# Patient Record
Sex: Male | Born: 1970
Health system: Southern US, Community
[De-identification: ages and names within clinical notes are randomized; demographics above are authoritative.]

## PROBLEM LIST (undated history)

## (undated) DIAGNOSIS — R911 Solitary pulmonary nodule: Secondary | ICD-10-CM

## (undated) DIAGNOSIS — J90 Pleural effusion, not elsewhere classified: Secondary | ICD-10-CM

## (undated) DIAGNOSIS — F259 Schizoaffective disorder, unspecified: Secondary | ICD-10-CM

## (undated) DIAGNOSIS — R51 Headache: Secondary | ICD-10-CM

## (undated) DIAGNOSIS — F419 Anxiety disorder, unspecified: Secondary | ICD-10-CM

## (undated) DIAGNOSIS — F329 Major depressive disorder, single episode, unspecified: Secondary | ICD-10-CM

## (undated) DIAGNOSIS — Z9289 Personal history of other medical treatment: Secondary | ICD-10-CM

## (undated) DIAGNOSIS — F32A Depression, unspecified: Secondary | ICD-10-CM

## (undated) DIAGNOSIS — I2699 Other pulmonary embolism without acute cor pulmonale: Secondary | ICD-10-CM

## (undated) DIAGNOSIS — E785 Hyperlipidemia, unspecified: Secondary | ICD-10-CM

## (undated) DIAGNOSIS — R519 Headache, unspecified: Secondary | ICD-10-CM

## (undated) DIAGNOSIS — I719 Aortic aneurysm of unspecified site, without rupture: Secondary | ICD-10-CM

## (undated) HISTORY — DX: Hyperlipidemia, unspecified: E78.5

## (undated) HISTORY — DX: Personal history of other medical treatment: Z92.89

## (undated) HISTORY — DX: Aortic aneurysm of unspecified site, without rupture: I71.9

## (undated) HISTORY — PX: CARDIAC CATHETERIZATION: SHX172

## (undated) HISTORY — DX: Headache: R51

## (undated) HISTORY — DX: Headache, unspecified: R51.9

## (undated) HISTORY — PX: OTHER SURGICAL HISTORY: SHX169

---

## 2006-10-08 ENCOUNTER — Emergency Department (HOSPITAL_COMMUNITY): Admission: EM | Admit: 2006-10-08 | Discharge: 2006-10-08 | Payer: Self-pay | Admitting: Emergency Medicine

## 2008-08-11 ENCOUNTER — Emergency Department (HOSPITAL_COMMUNITY): Admission: EM | Admit: 2008-08-11 | Discharge: 2008-08-12 | Payer: Self-pay | Admitting: Emergency Medicine

## 2008-08-12 ENCOUNTER — Inpatient Hospital Stay (HOSPITAL_COMMUNITY): Admission: AD | Admit: 2008-08-12 | Discharge: 2008-08-15 | Payer: Self-pay | Admitting: Psychiatry

## 2008-08-12 ENCOUNTER — Ambulatory Visit: Payer: Self-pay | Admitting: Psychiatry

## 2008-12-01 ENCOUNTER — Emergency Department (HOSPITAL_COMMUNITY): Admission: EM | Admit: 2008-12-01 | Discharge: 2008-12-02 | Payer: Self-pay | Admitting: Emergency Medicine

## 2008-12-02 ENCOUNTER — Inpatient Hospital Stay (HOSPITAL_COMMUNITY): Admission: EM | Admit: 2008-12-02 | Discharge: 2008-12-04 | Payer: Self-pay | Admitting: Psychiatry

## 2008-12-03 ENCOUNTER — Ambulatory Visit: Payer: Self-pay | Admitting: Psychiatry

## 2009-01-18 ENCOUNTER — Ambulatory Visit: Payer: Self-pay | Admitting: Psychiatry

## 2009-01-18 ENCOUNTER — Emergency Department (HOSPITAL_COMMUNITY): Admission: EM | Admit: 2009-01-18 | Discharge: 2009-01-18 | Payer: Self-pay | Admitting: Emergency Medicine

## 2009-01-19 ENCOUNTER — Inpatient Hospital Stay (HOSPITAL_COMMUNITY): Admission: RE | Admit: 2009-01-19 | Discharge: 2009-01-26 | Payer: Self-pay | Admitting: Psychiatry

## 2009-01-20 ENCOUNTER — Ambulatory Visit (HOSPITAL_COMMUNITY): Admission: RE | Admit: 2009-01-20 | Discharge: 2009-01-20 | Payer: Self-pay | Admitting: Psychiatry

## 2009-09-30 ENCOUNTER — Emergency Department (HOSPITAL_COMMUNITY): Admission: EM | Admit: 2009-09-30 | Discharge: 2009-10-01 | Payer: Self-pay | Admitting: Emergency Medicine

## 2009-10-01 ENCOUNTER — Ambulatory Visit: Payer: Self-pay | Admitting: Psychiatry

## 2009-10-01 ENCOUNTER — Inpatient Hospital Stay (HOSPITAL_COMMUNITY): Admission: AD | Admit: 2009-10-01 | Discharge: 2009-10-02 | Payer: Self-pay | Admitting: Psychiatry

## 2009-11-24 ENCOUNTER — Emergency Department (HOSPITAL_COMMUNITY): Admission: EM | Admit: 2009-11-24 | Discharge: 2009-11-24 | Payer: Self-pay | Admitting: Emergency Medicine

## 2009-11-30 ENCOUNTER — Emergency Department (HOSPITAL_COMMUNITY): Admission: EM | Admit: 2009-11-30 | Discharge: 2009-11-30 | Payer: Self-pay | Admitting: Emergency Medicine

## 2009-12-04 ENCOUNTER — Emergency Department (HOSPITAL_COMMUNITY): Admission: EM | Admit: 2009-12-04 | Discharge: 2009-12-04 | Payer: Self-pay | Admitting: Emergency Medicine

## 2010-05-14 LAB — BASIC METABOLIC PANEL
BUN: 7 mg/dL (ref 6–23)
Chloride: 109 mEq/L (ref 96–112)
GFR calc non Af Amer: >60 mL/min (ref >60)
Potassium: 3.8 mEq/L (ref 3.5–5.1)
Sodium: 139 mEq/L (ref 135–145)

## 2010-05-14 LAB — CBC
HCT: 47.2 % (ref 39.0–52.0)
Hemoglobin: 16.3 g/dL (ref 13.0–17.0)
MCV: 99.2 fL (ref 78.0–100.0)
RBC: 4.76 MIL/uL (ref 4.22–5.81)
WBC: 12.7 10*3/uL — ABNORMAL HIGH (ref 4.0–10.5)

## 2010-05-14 LAB — ETHANOL: Alcohol, Ethyl (B): <5 mg/dL (ref 0–10)

## 2010-05-14 LAB — DIFFERENTIAL
Basophils Absolute: 0.2 10*3/uL — ABNORMAL HIGH (ref 0.0–0.1)
Eosinophils Relative: 3 % (ref 0–5)
Lymphocytes Relative: 30 % (ref 12–46)
Lymphs Abs: 3.8 10*3/uL (ref 0.7–4.0)
Monocytes Absolute: 0.8 10*3/uL (ref 0.1–1.0)
Monocytes Relative: 6 % (ref 3–12)
Neutro Abs: 7.5 10*3/uL (ref 1.7–7.7)

## 2010-05-14 LAB — RAPID URINE DRUG SCREEN, HOSP PERFORMED
Barbiturates: NOT DETECTED
Benzodiazepines: POSITIVE — AB

## 2010-05-14 LAB — ACETAMINOPHEN LEVEL: Acetaminophen (Tylenol), Serum: <10 ug/mL — ABNORMAL LOW (ref 10–30)

## 2010-06-02 LAB — BASIC METABOLIC PANEL
CO2: 25 mEq/L (ref 19–32)
Calcium: 9.3 mg/dL (ref 8.4–10.5)
Chloride: 102 mEq/L (ref 96–112)
GFR calc Af Amer: >60 mL/min (ref >60)
Glucose, Bld: 89 mg/dL (ref 70–99)
Potassium: 3.4 mEq/L — ABNORMAL LOW (ref 3.5–5.1)
Sodium: 137 mEq/L (ref 135–145)

## 2010-06-02 LAB — RAPID URINE DRUG SCREEN, HOSP PERFORMED
Amphetamines: NOT DETECTED
Cocaine: NOT DETECTED
Opiates: NOT DETECTED
Tetrahydrocannabinol: NOT DETECTED

## 2010-06-02 LAB — DIFFERENTIAL
Basophils Absolute: 0 10*3/uL (ref 0.0–0.1)
Basophils Relative: 0 % (ref 0–1)
Eosinophils Relative: 1 % (ref 0–5)
Lymphocytes Relative: 18 % (ref 12–46)
Lymphocytes Relative: 46 % (ref 12–46)
Monocytes Absolute: 0.7 10*3/uL (ref 0.1–1.0)
Monocytes Relative: 5 % (ref 3–12)
Neutro Abs: 10.8 10*3/uL — ABNORMAL HIGH (ref 1.7–7.7)
Neutro Abs: 4.9 10*3/uL (ref 1.7–7.7)
Neutrophils Relative %: 44 % (ref 43–77)

## 2010-06-02 LAB — URINALYSIS, ROUTINE W REFLEX MICROSCOPIC
Bilirubin Urine: NEGATIVE
Hgb urine dipstick: NEGATIVE
Ketones, ur: NEGATIVE mg/dL
Specific Gravity, Urine: 1.015 (ref 1.005–1.030)
pH: 5.5 (ref 5.0–8.0)

## 2010-06-02 LAB — CBC
HCT: 51.5 % (ref 39.0–52.0)
Hemoglobin: 17.9 g/dL — ABNORMAL HIGH (ref 13.0–17.0)
MCHC: 34.8 g/dL (ref 30.0–36.0)
Platelets: 216 10*3/uL (ref 150–400)
RBC: 5.16 MIL/uL (ref 4.22–5.81)
RDW: 13.2 % (ref 11.5–15.5)

## 2010-06-03 LAB — CBC
HCT: 48.3 % (ref 39.0–52.0)
MCV: 101.4 fL — ABNORMAL HIGH (ref 78.0–100.0)
Platelets: 229 10*3/uL (ref 150–400)
WBC: 12.8 10*3/uL — ABNORMAL HIGH (ref 4.0–10.5)

## 2010-06-03 LAB — BASIC METABOLIC PANEL
BUN: 8 mg/dL (ref 6–23)
Chloride: 105 mEq/L (ref 96–112)
Glucose, Bld: 88 mg/dL (ref 70–99)
Potassium: 4.2 mEq/L (ref 3.5–5.1)

## 2010-06-03 LAB — RAPID URINE DRUG SCREEN, HOSP PERFORMED
Barbiturates: NOT DETECTED
Benzodiazepines: POSITIVE — AB
Cocaine: NOT DETECTED
Opiates: POSITIVE — AB

## 2010-06-03 LAB — DIFFERENTIAL
Eosinophils Absolute: 0.3 10*3/uL (ref 0.0–0.7)
Eosinophils Relative: 3 % (ref 0–5)
Lymphs Abs: 2.3 10*3/uL (ref 0.7–4.0)
Monocytes Relative: 4 % (ref 3–12)

## 2010-06-03 LAB — ETHANOL: Alcohol, Ethyl (B): <5 mg/dL (ref 0–10)

## 2010-06-03 LAB — HEPATIC FUNCTION PANEL
ALT: 19 U/L (ref 0–53)
Total Protein: 6.8 g/dL (ref 6.0–8.3)

## 2010-06-03 LAB — ACETAMINOPHEN LEVEL: Acetaminophen (Tylenol), Serum: <10 ug/mL — ABNORMAL LOW (ref 10–30)

## 2010-06-07 LAB — CBC
HCT: 47.8 % (ref 39.0–52.0)
MCV: 99.2 fL (ref 78.0–100.0)
Platelets: 228 10*3/uL (ref 150–400)
RDW: 12.9 % (ref 11.5–15.5)

## 2010-06-07 LAB — DIFFERENTIAL
Basophils Absolute: 0.1 10*3/uL (ref 0.0–0.1)
Eosinophils Absolute: 0.3 10*3/uL (ref 0.0–0.7)
Eosinophils Relative: 2 % (ref 0–5)
Lymphs Abs: 2.5 10*3/uL (ref 0.7–4.0)
Monocytes Absolute: 0.4 10*3/uL (ref 0.1–1.0)

## 2010-06-07 LAB — BASIC METABOLIC PANEL
BUN: 8 mg/dL (ref 6–23)
CO2: 25 mEq/L (ref 19–32)
Chloride: 107 mEq/L (ref 96–112)
Glucose, Bld: 93 mg/dL (ref 70–99)
Potassium: 3.7 mEq/L (ref 3.5–5.1)

## 2010-06-07 LAB — LITHIUM LEVEL
Lithium Lvl: <0.25 mEq/L — ABNORMAL LOW (ref 0.80–1.40)
Lithium Lvl: <0.25 mEq/L — ABNORMAL LOW (ref 0.80–1.40)

## 2010-06-07 LAB — RAPID URINE DRUG SCREEN, HOSP PERFORMED
Barbiturates: NOT DETECTED
Cocaine: NOT DETECTED
Opiates: NOT DETECTED
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
## 2010-07-16 NOTE — Discharge Summary (Signed)
NAME:  Mcglinchey, ASHYR HEDGEPATH NO.:  [id number]   MEDICAL RECORD NO.:  [id number]          PATIENT TYPE:  IPS   LOCATION:  0502                          FACILITY:  BH   PHYSICIAN:  Geoffery Lyons, M.D.      DATE OF BIRTH:  1970-06-12   DATE OF ADMISSION:  08/12/2008  DATE OF DISCHARGE:  08/15/2008                               DISCHARGE SUMMARY   CHIEF COMPLAINT/HISTORY OF PRESENT ILLNESS:  This was the first  admission to [facility] for this 40 year old male  involuntarily petitioned stating that he was threatening to go to his  work place with his shot gun and kill 30 people.  He was threatening to  go to his apartment and kill his girlfriend.  He was expressing thoughts  of homicide.  He lost his job due to homicidal threats.  He lost contact  with his family members.  Has no income and is possibly homeless.   PAST PSYCHIATRIC HISTORY:  First time at [facility].  Has been  hospitalized prior at [facility], [facility] [place], [place].  Has  seen Dr. Betti Cruz at [facility].   ALCOHOL AND DRUG HABITS:  Denies active use of any substances.   MEDICAL HISTORY:  Noncontributory.   MEDICATIONS:  1. Klonopin 0.5 twice a day as needed.  2. Abilify 20 mg per day.  3. Lithium 300 mg two daily.  4. Paxil 20 mg two tablets daily.   PHYSICAL EXAMINATION:  Exam failed to show any acute findings.   LABORATORY WORK:  Lithium level less than 0.25.  B-met within normal  limits.  UDS negative for substances of abuse.  White count 14.1.   MENTAL STATUS EXAM:  Reveals a male who is alert, appropriately dressed,  good eye contact.  Speech is soft-spoken and clear.  Mood is anxious.  Affect is constricted.  Thought processes logical and coherent and  relevant.  Denies any active suicidal/homicidal ideas.  Denies  hallucinations.  There is no evidence of delusions.  Cognition well-  preserved.   DISCHARGE DIAGNOSES:  AXIS I:  Mood disorder NOS.  AXIS II:  No  diagnosis.  AXIS III:  No diagnosis.  AXIS IV: Moderate.  AXIS V:  On admission 35, GAF in the last year 60.   COURSE IN THE HOSPITAL:  He was admitted.  He was started on individual  and group psychotherapy.  As already stated, he is a 40 year old male,  divorced, no children with extensive psychiatric hospitalization at  [facility], [place], [facility] secondary to overdoses and episodes of  agitation.  [place] Spaniel that he was admitted as he said he wanted to choke his  girlfriend and kill people at work.  Said that he thinks these kinds of  things often every day, but that his mistake was to tell his [place] worker  what he was thinking because he has never acted out any of these  thoughts.  Endorsed he just got a job and due to __________ his employer  was contacted and that he lost his job.  Again, he said he would never  act on these thoughts.  Seen at [facility] on Paxil, Abilify, active with  the ACT team.  On June 16, upon this assessment, he was upset because he  is in the hospital.  He would not hurt anyone, would rather work.  But  if not at work, then he would be at home watching TV.  Claimed was  diagnosed with schizophrenia.  Denies hallucination but does endorse  persistent paranoia.  We pursue medication work on Pharmacologist.  We  worked on getting more information.  The ACT team was actively working  with him.  They were trying to secure a different placement as he could  not go back and live with the girlfriend.  There were looking for a  hotel room in [facility].  He continued to want to be discharged.  June 18, he was in full contact with reality.  No active  suicidal/homicidal ideas, no delusions.  No hallucinations.  Wanted to  be discharged.  Claimed he never meant to imply he was going to hurt  anyone.  He was just upset.  The outpatient providers were indeed able  to confirm that he has never evidenced any violent.  Upon discharge, in  full contact with reality.   Committed to abstinence.   DISCHARGE DIAGNOSES:  AXIS I:  Rule out psychotic disorder NOS,  delusional disorder, paranoid type.  Rule out mood disorder NOS.  AXIS II:  No diagnosis.  AXIS III:  No diagnosis.  AXIS IV:  Moderate.  AXIS V:  On discharge 50.   DISCHARGE MEDICATIONS:  1. Abilify 20 mg per day.  2. Lithium 300 mg two tablets daily.  3. Paxil 20 mg two tablets daily.  4. Klonopin 0.5 twice a day as needed for anxiety.   FOLLOWUP:  Follow-up through [facility] ACT team.      Geoffery Lyons, M.D.  Electronically Signed     IL/MEDQ  D:  09/16/2008  T:  09/16/2008  Job:  [id number]

## 2010-12-13 LAB — CBC
HCT: 46.2
MCHC: 34.3
Platelets: 234
RDW: 13.5

## 2010-12-13 LAB — RAPID URINE DRUG SCREEN, HOSP PERFORMED
Barbiturates: NOT DETECTED
Benzodiazepines: NOT DETECTED

## 2010-12-13 LAB — BASIC METABOLIC PANEL
BUN: 9
CO2: 29
GFR calc non Af Amer: >60
Glucose, Bld: 73
Potassium: 4.6

## 2010-12-13 LAB — DIFFERENTIAL
Basophils Absolute: 0.1
Basophils Relative: 1
Eosinophils Absolute: 0.6
Eosinophils Relative: 6 — ABNORMAL HIGH
Lymphocytes Relative: 37

## 2011-01-14 ENCOUNTER — Emergency Department (HOSPITAL_COMMUNITY)
Admission: EM | Admit: 2011-01-14 | Discharge: 2011-01-14 | Disposition: A | Discharge status: Home / Self Care | Payer: Medicare Other | Attending: Emergency Medicine | Admitting: Emergency Medicine

## 2011-01-14 ENCOUNTER — Emergency Department (HOSPITAL_COMMUNITY): Payer: Medicare Other

## 2011-01-14 ENCOUNTER — Other Ambulatory Visit: Payer: Self-pay

## 2011-01-14 DIAGNOSIS — F17200 Nicotine dependence, unspecified, uncomplicated: Secondary | ICD-10-CM | POA: Insufficient documentation

## 2011-01-14 DIAGNOSIS — R110 Nausea: Secondary | ICD-10-CM | POA: Insufficient documentation

## 2011-01-14 DIAGNOSIS — R0602 Shortness of breath: Secondary | ICD-10-CM | POA: Insufficient documentation

## 2011-01-14 DIAGNOSIS — J40 Bronchitis, not specified as acute or chronic: Secondary | ICD-10-CM

## 2011-01-14 DIAGNOSIS — R0789 Other chest pain: Secondary | ICD-10-CM

## 2011-01-14 DIAGNOSIS — R071 Chest pain on breathing: Secondary | ICD-10-CM | POA: Insufficient documentation

## 2011-01-14 DIAGNOSIS — R05 Cough: Secondary | ICD-10-CM | POA: Insufficient documentation

## 2011-01-14 DIAGNOSIS — R059 Cough, unspecified: Secondary | ICD-10-CM | POA: Insufficient documentation

## 2011-01-14 HISTORY — DX: Schizoaffective disorder, unspecified: F25.9

## 2011-01-14 LAB — DIFFERENTIAL
Eosinophils Absolute: 0.3 10*3/uL (ref 0.0–0.7)
Eosinophils Relative: 3 % (ref 0–5)
Lymphocytes Relative: 37 % (ref 12–46)
Lymphs Abs: 3.9 10*3/uL (ref 0.7–4.0)
Monocytes Absolute: 0.6 10*3/uL (ref 0.1–1.0)
Monocytes Relative: 5 % (ref 3–12)

## 2011-01-14 LAB — COMPREHENSIVE METABOLIC PANEL
ALT: 19 U/L (ref 0–53)
Alkaline Phosphatase: 72 U/L (ref 39–117)
BUN: 11 mg/dL (ref 6–23)
CO2: 25 mEq/L (ref 19–32)
Calcium: 9.4 mg/dL (ref 8.4–10.5)
GFR calc Af Amer: >90 mL/min (ref >90)
GFR calc non Af Amer: >90 mL/min (ref >90)
Glucose, Bld: 98 mg/dL (ref 70–99)
Total Protein: 7.1 g/dL (ref 6.0–8.3)

## 2011-01-14 LAB — CBC
HCT: 46.5 % (ref 39.0–52.0)
Hemoglobin: 16.3 g/dL (ref 13.0–17.0)
MCH: 34.5 pg — ABNORMAL HIGH (ref 26.0–34.0)
MCV: 98.5 fL (ref 78.0–100.0)
RBC: 4.72 MIL/uL (ref 4.22–5.81)
WBC: 10.8 10*3/uL — ABNORMAL HIGH (ref 4.0–10.5)

## 2011-01-14 LAB — APTT: aPTT: 30 seconds (ref 24–37)

## 2011-01-14 LAB — PROTIME-INR: INR: 1.02 (ref 0.00–1.49)

## 2011-01-14 LAB — POCT I-STAT TROPONIN I: Troponin i, poc: 0.01 ng/mL (ref 0.00–0.08)

## 2011-01-14 MED ORDER — KETOROLAC TROMETHAMINE 30 MG/ML IJ SOLN
30.0000 mg | Freq: Once | INTRAMUSCULAR | Status: AC
  Administered 2011-01-14: 30 mg via INTRAVENOUS
  Filled 2011-01-14: qty 1

## 2011-01-14 MED ORDER — CYCLOBENZAPRINE HCL 10 MG PO TABS
10.0000 mg | ORAL_TABLET | Freq: Once | ORAL | Status: AC
  Administered 2011-01-14: 10 mg via ORAL
  Filled 2011-01-14: qty 1

## 2011-01-14 MED ORDER — CYCLOBENZAPRINE HCL 10 MG PO TABS: 10.0000 mg | ORAL_TABLET | Freq: Three times a day (TID) | ORAL | Status: AC | PRN

## 2011-01-14 MED ORDER — DOXYCYCLINE HYCLATE 100 MG PO CAPS: 100.0000 mg | ORAL_CAPSULE | Freq: Two times a day (BID) | ORAL | Status: AC

## 2011-01-14 MED ORDER — ASPIRIN 81 MG PO CHEW
324.0000 mg | CHEWABLE_TABLET | Freq: Once | ORAL | Status: AC
  Administered 2011-01-14: 324 mg via ORAL
  Filled 2011-01-14: qty 4

## 2011-01-14 NOTE — ED Notes (Signed)
Pt reports cp that began last night.  Pt reports the pain is directly over his left chest.  Pt reports some sob, but denies any n/v.

## 2011-01-14 NOTE — ED Provider Notes (Signed)
Scribed for Ward Givens, MD, the patient was seen in room APA15/APA15 . This chart was scribed by Ellie Lunch.   CSN: [id number] Arrival date & time: 01/14/2011  6:22 PM   First MD Initiated Contact with Patient 01/14/11 1825      Chief Complaint  Patient presents with  . Chest Pain    (Consider location/radiation/quality/duration/timing/severity/associated sxs/prior treatment) HPI Pt seen at 6:29 PM  Ian Grimes is a 40 y.o. male who presents to the Emergency Department complaining of an upper left chest pain. Pain started last night ~21:15 while Pt was watching Television. Pain is described as a sharp, pulsing pain and lasted most of the night. Pain does not radiate. Pain at worst rated 10/10. Pain is associated with some SOB. Pt denies any diaphoresis. Pain is aggravated with deep breathing, cough, and sneezing. Pt had nausea and some spitting up this morning. Pt called EMS last night and had ECG and BP taken. Pt declined to come in to ED. Was advised to take Asprin, but was not able to take any. Pt treated pain with a heat pack with some improvement. Pt was able to sleep and woke up to the same pain. Pain has been constant since he woke up this morning. Pain currently 8/10. Pt denies a history of any heart issues or chest pains. Pt denies doing anything out of his normal routine for the past few days. Pt additionally notes he developed a dry cough 2 days ago.   No PCP  Past Medical History  Diagnosis Date  . Schizo-affective psychosis     Past Surgical History  Procedure Date  . Rib injury     No family history on file. Family history of heart conditions in all fathers family. Father had heart issues ~55  History  Substance Use Topics  . Smoking status: Current Everyday Smoker  . Smokeless tobacco: Not on file  . Alcohol Use: No  Pt smokes 1 pack daily Pt does not currently work; is on disability.     Review of Systems  Respiratory: Positive for cough and  shortness of breath.   Cardiovascular: Positive for chest pain.  Gastrointestinal: Negative for nausea and vomiting.  All other systems reviewed and are negative.    Allergies  Bactrim  Home Medications  No current outpatient prescriptions on file.   BP 102/71  Pulse 77  Resp 20  SpO2 97%   Vital signs normal  Physical Exam  Vitals reviewed. Constitutional: He is oriented to person, place, and time. He appears well-developed and well-nourished.  HENT:  Head: Normocephalic and atraumatic.  Eyes: Conjunctivae and EOM are normal. Pupils are equal, round, and reactive to light.  Neck: Normal range of motion. Neck supple.  Cardiovascular: Normal rate, regular rhythm and normal heart sounds.   Pulmonary/Chest: Effort normal and breath sounds normal. No respiratory distress. He has no wheezes. He has no rales. He exhibits no tenderness.         Patient does not have pain when I palpate the area he indicates as the source of his pain however he does experience pain with range of motion of his left arm. States it hurts in the same area when he coughs or sneezes.  Musculoskeletal: Normal range of motion.  Neurological: He is alert and oriented to person, place, and time.  Skin: Skin is warm and dry.  Psychiatric: His behavior is normal. Thought content normal.       Affect is flat  ED Course  Procedures (including critical care time) OTHER DATA REVIEWED: Nursing notes, vital signs, and past medical records reviewed.   DIAGNOSTIC STUDIES: Oxygen Saturation is 97% on room air, normal by my interpretation.     Date: 01/14/2011  Rate: 72  Rhythm: normal sinus rhythm  QRS Axis: normal  Intervals: normal  ST/T Wave abnormalities: normal  Conduction Disutrbances:none  Narrative Interpretation:   Old EKG Reviewed: unchanged from 12/01/2008   Results for orders placed during the hospital encounter of 01/14/11  CBC      Component Value Range   WBC 10.8 (*) 4.0 - 10.5  (K/uL)   RBC 4.72  4.22 - 5.81 (MIL/uL)   Hemoglobin 16.3  13.0 - 17.0 (g/dL)   HCT 54.0  98.1 - 19.1 (%)   MCV 98.5  78.0 - 100.0 (fL)   MCH 34.5 (*) 26.0 - 34.0 (pg)   MCHC 35.1  30.0 - 36.0 (g/dL)   RDW 47.8  29.5 - 62.1 (%)   Platelets 260  150 - 400 (K/uL)  DIFFERENTIAL      Component Value Range   Neutrophils Relative 55  43 - 77 (%)   Neutro Abs 5.9  1.7 - 7.7 (K/uL)   Lymphocytes Relative 37  12 - 46 (%)   Lymphs Abs 3.9  0.7 - 4.0 (K/uL)   Monocytes Relative 5  3 - 12 (%)   Monocytes Absolute 0.6  0.1 - 1.0 (K/uL)   Eosinophils Relative 3  0 - 5 (%)   Eosinophils Absolute 0.3  0.0 - 0.7 (K/uL)   Basophils Relative 0  0 - 1 (%)   Basophils Absolute 0.0  0.0 - 0.1 (K/uL)  COMPREHENSIVE METABOLIC PANEL      Component Value Range   Sodium 139  135 - 145 (mEq/L)   Potassium 3.6  3.5 - 5.1 (mEq/L)   Chloride 105  96 - 112 (mEq/L)   CO2 25  19 - 32 (mEq/L)   Glucose, Bld 98  70 - 99 (mg/dL)   BUN 11  6 - 23 (mg/dL)   Creatinine, Ser 3.08  0.50 - 1.35 (mg/dL)   Calcium 9.4  8.4 - 65.7 (mg/dL)   Total Protein 7.1  6.0 - 8.3 (g/dL)   Albumin 4.1  3.5 - 5.2 (g/dL)   AST 15  0 - 37 (U/L)   ALT 19  0 - 53 (U/L)   Alkaline Phosphatase 72  39 - 117 (U/L)   Total Bilirubin 0.3  0.3 - 1.2 (mg/dL)   GFR calc non Af Amer >90  >90 (mL/min)   GFR calc Af Amer >90  >90 (mL/min)  APTT      Component Value Range   aPTT 30  24 - 37 (seconds)  PROTIME-INR      Component Value Range   Prothrombin Time 13.6  11.6 - 15.2 (seconds)   INR 1.02  0.00 - 1.49   POCT I-STAT TROPONIN I      Component Value Range   Troponin i, poc 0.01  0.00 - 0.08 (ng/mL)   Comment 3             Laboratory interpretation mild leukocytosis otherwise normal  Dg Chest 2 View  01/14/2011  *RADIOLOGY REPORT*  Clinical Data: Left-sided chest pain.  Cough.  Shortness of breath.  CHEST - 2 VIEW  Comparison:  11/24/2009  Findings:  The heart size and mediastinal contours are within normal limits.  Both lungs are  clear.  The visualized  skeletal structures are unremarkable.  IMPRESSION: No active cardiopulmonary disease.  Original Report Authenticated By: Danae Orleans, M.D.   ED MEDICATIONS  Medications  ketorolac (TORADOL) 30 MG/ML injection 30 mg (30 mg Intravenous Given 01/14/11 1901)  cyclobenzaprine (FLEXERIL) tablet 10 mg (10 mg Oral Given 01/14/11 1902)  aspirin chewable tablet 324 mg (324 mg Oral Given 01/14/11 1859)   8:31 PM Pt recheck. Discussed labs with Pt do not indicate Mi or pneumonia. Pain is likely chest wall pain. Pt reports feeling improved and ready to go home. Plan to discharge.  Patient has had almost constant chest pain since last night if he were having a coronary event his enzymes would be positive during his ER visit.  ED DISCHARGE MEDICATIONS New Prescriptions   CYCLOBENZAPRINE (FLEXERIL) 10 MG TABLET    Take 1 tablet (10 mg total) by mouth 3 (three) times daily as needed for muscle spasms.   DOXYCYCLINE (VIBRAMYCIN) 100 MG CAPSULE    Take 1 capsule (100 mg total) by mouth 2 (two) times daily.     1. Chest wall pain   2. Bronchitis    Plan discharge with ibuprofen Flexeril, and doxycycline.  MDM    I personally performed the services described in this documentation, which was scribed in my presence. The recorded information has been reviewed and considered. Devoria Albe, MD, Armando Gang         Ward Givens, MD 01/14/11 2051

## 2011-03-12 DIAGNOSIS — F319 Bipolar disorder, unspecified: Secondary | ICD-10-CM | POA: Diagnosis not present

## 2011-03-12 DIAGNOSIS — F411 Generalized anxiety disorder: Secondary | ICD-10-CM | POA: Diagnosis not present

## 2011-04-03 ENCOUNTER — Encounter (HOSPITAL_COMMUNITY): Payer: Self-pay

## 2011-04-03 ENCOUNTER — Emergency Department (HOSPITAL_COMMUNITY)
Admission: EM | Admit: 2011-04-03 | Discharge: 2011-04-03 | Disposition: A | Discharge status: Home / Self Care | Payer: Medicare Other | Attending: Emergency Medicine | Admitting: Emergency Medicine

## 2011-04-03 ENCOUNTER — Emergency Department (HOSPITAL_COMMUNITY): Payer: Medicare Other

## 2011-04-03 DIAGNOSIS — M79609 Pain in unspecified limb: Secondary | ICD-10-CM | POA: Diagnosis not present

## 2011-04-03 DIAGNOSIS — W2209XA Striking against other stationary object, initial encounter: Secondary | ICD-10-CM | POA: Insufficient documentation

## 2011-04-03 DIAGNOSIS — IMO0001 Reserved for inherently not codable concepts without codable children: Secondary | ICD-10-CM | POA: Insufficient documentation

## 2011-04-03 DIAGNOSIS — T148XXA Other injury of unspecified body region, initial encounter: Secondary | ICD-10-CM

## 2011-04-03 DIAGNOSIS — M25579 Pain in unspecified ankle and joints of unspecified foot: Secondary | ICD-10-CM | POA: Diagnosis not present

## 2011-04-03 DIAGNOSIS — S8000XA Contusion of unspecified knee, initial encounter: Secondary | ICD-10-CM | POA: Diagnosis not present

## 2011-04-03 DIAGNOSIS — F17200 Nicotine dependence, unspecified, uncomplicated: Secondary | ICD-10-CM | POA: Diagnosis not present

## 2011-04-03 DIAGNOSIS — Y92009 Unspecified place in unspecified non-institutional (private) residence as the place of occurrence of the external cause: Secondary | ICD-10-CM | POA: Insufficient documentation

## 2011-04-03 DIAGNOSIS — F259 Schizoaffective disorder, unspecified: Secondary | ICD-10-CM | POA: Diagnosis not present

## 2011-04-03 DIAGNOSIS — M25569 Pain in unspecified knee: Secondary | ICD-10-CM | POA: Diagnosis not present

## 2011-04-03 MED ORDER — OXYCODONE-ACETAMINOPHEN 5-325 MG PO TABS
1.0000 | ORAL_TABLET | Freq: Once | ORAL | Status: AC
  Administered 2011-04-03: 1 via ORAL
  Filled 2011-04-03: qty 1

## 2011-04-03 MED ORDER — HYDROCODONE-ACETAMINOPHEN 5-325 MG PO TABS: 1.0000 | ORAL_TABLET | ORAL | Status: AC | PRN
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
## 2011-04-04 NOTE — ED Provider Notes (Signed)
Medical screening examination/treatment/procedure(s) were performed by non-physician practitioner and as supervising physician I was immediately available for consultation/collaboration.  Nicoletta Dress. Colon Branch, MD 04/04/11 1549

## 2011-04-06 ENCOUNTER — Ambulatory Visit: Payer: Medicare Other | Admitting: Orthopedic Surgery

## 2011-04-06 ENCOUNTER — Encounter: Payer: Self-pay | Admitting: Orthopedic Surgery

## 2011-06-15 DIAGNOSIS — E559 Vitamin D deficiency, unspecified: Secondary | ICD-10-CM | POA: Diagnosis not present

## 2011-06-15 DIAGNOSIS — E785 Hyperlipidemia, unspecified: Secondary | ICD-10-CM | POA: Diagnosis not present

## 2011-06-15 DIAGNOSIS — E039 Hypothyroidism, unspecified: Secondary | ICD-10-CM | POA: Diagnosis not present

## 2011-06-15 DIAGNOSIS — R5381 Other malaise: Secondary | ICD-10-CM | POA: Diagnosis not present

## 2011-06-15 DIAGNOSIS — R5383 Other fatigue: Secondary | ICD-10-CM | POA: Diagnosis not present

## 2011-06-15 DIAGNOSIS — F411 Generalized anxiety disorder: Secondary | ICD-10-CM | POA: Diagnosis not present

## 2011-06-26 ENCOUNTER — Encounter (HOSPITAL_COMMUNITY): Payer: Self-pay

## 2011-06-26 ENCOUNTER — Emergency Department (HOSPITAL_COMMUNITY): Payer: Medicare Other

## 2011-06-26 ENCOUNTER — Emergency Department (HOSPITAL_COMMUNITY)
Admission: EM | Admit: 2011-06-26 | Discharge: 2011-06-26 | Disposition: A | Discharge status: Home / Self Care | Payer: Medicare Other | Attending: Emergency Medicine | Admitting: Emergency Medicine

## 2011-06-26 DIAGNOSIS — Z0389 Encounter for observation for other suspected diseases and conditions ruled out: Secondary | ICD-10-CM | POA: Diagnosis not present

## 2011-06-26 DIAGNOSIS — J449 Chronic obstructive pulmonary disease, unspecified: Secondary | ICD-10-CM | POA: Diagnosis not present

## 2011-06-26 DIAGNOSIS — IMO0002 Reserved for concepts with insufficient information to code with codable children: Secondary | ICD-10-CM | POA: Diagnosis not present

## 2011-06-26 DIAGNOSIS — T424X4A Poisoning by benzodiazepines, undetermined, initial encounter: Secondary | ICD-10-CM | POA: Diagnosis not present

## 2011-06-26 DIAGNOSIS — Z79899 Other long term (current) drug therapy: Secondary | ICD-10-CM | POA: Diagnosis not present

## 2011-06-26 DIAGNOSIS — F259 Schizoaffective disorder, unspecified: Secondary | ICD-10-CM | POA: Diagnosis not present

## 2011-06-26 DIAGNOSIS — R45851 Suicidal ideations: Secondary | ICD-10-CM | POA: Diagnosis not present

## 2011-06-26 DIAGNOSIS — F17200 Nicotine dependence, unspecified, uncomplicated: Secondary | ICD-10-CM | POA: Insufficient documentation

## 2011-06-26 DIAGNOSIS — T50901A Poisoning by unspecified drugs, medicaments and biological substances, accidental (unintentional), initial encounter: Secondary | ICD-10-CM | POA: Diagnosis not present

## 2011-06-26 DIAGNOSIS — H538 Other visual disturbances: Secondary | ICD-10-CM | POA: Insufficient documentation

## 2011-06-26 DIAGNOSIS — T424X1A Poisoning by benzodiazepines, accidental (unintentional), initial encounter: Secondary | ICD-10-CM

## 2011-06-26 DIAGNOSIS — F603 Borderline personality disorder: Secondary | ICD-10-CM | POA: Diagnosis not present

## 2011-06-26 DIAGNOSIS — R443 Hallucinations, unspecified: Secondary | ICD-10-CM | POA: Insufficient documentation

## 2011-06-26 DIAGNOSIS — T43591A Poisoning by other antipsychotics and neuroleptics, accidental (unintentional), initial encounter: Secondary | ICD-10-CM | POA: Insufficient documentation

## 2011-06-26 LAB — DIFFERENTIAL
Basophils Absolute: 0.1 10*3/uL (ref 0.0–0.1)
Eosinophils Absolute: 0.4 10*3/uL (ref 0.0–0.7)
Eosinophils Relative: 3 % (ref 0–5)
Lymphs Abs: 3.3 10*3/uL (ref 0.7–4.0)
Neutrophils Relative %: 70 % (ref 43–77)

## 2011-06-26 LAB — URINALYSIS, ROUTINE W REFLEX MICROSCOPIC
Bilirubin Urine: NEGATIVE
Glucose, UA: NEGATIVE mg/dL
Hgb urine dipstick: NEGATIVE
Specific Gravity, Urine: <1.005 — ABNORMAL LOW (ref 1.005–1.030)
pH: 5.5 (ref 5.0–8.0)

## 2011-06-26 LAB — CBC
MCH: 34.7 pg — ABNORMAL HIGH (ref 26.0–34.0)
MCV: 98.5 fL (ref 78.0–100.0)
Platelets: 245 10*3/uL (ref 150–400)
RBC: 4.73 MIL/uL (ref 4.22–5.81)
RDW: 12.9 % (ref 11.5–15.5)
WBC: 14.6 10*3/uL — ABNORMAL HIGH (ref 4.0–10.5)

## 2011-06-26 LAB — COMPREHENSIVE METABOLIC PANEL
ALT: 20 U/L (ref 0–53)
AST: 16 U/L (ref 0–37)
Albumin: 3.9 g/dL (ref 3.5–5.2)
CO2: 24 mEq/L (ref 19–32)
Calcium: 9.7 mg/dL (ref 8.4–10.5)
Creatinine, Ser: 0.78 mg/dL (ref 0.50–1.35)
GFR calc non Af Amer: >90 mL/min (ref >90)
Sodium: 140 mEq/L (ref 135–145)
Total Protein: 7.1 g/dL (ref 6.0–8.3)

## 2011-06-26 LAB — RAPID URINE DRUG SCREEN, HOSP PERFORMED
Cocaine: NOT DETECTED
Opiates: NOT DETECTED
Tetrahydrocannabinol: NOT DETECTED

## 2011-06-26 LAB — TRICYCLICS SCREEN, URINE: TCA Scrn: NOT DETECTED

## 2011-06-26 LAB — VALPROIC ACID LEVEL: Valproic Acid Lvl: 14.5 ug/mL — ABNORMAL LOW (ref 50.0–100.0)

## 2011-06-26 LAB — ACETAMINOPHEN LEVEL: Acetaminophen (Tylenol), Serum: <15 ug/mL (ref 10–30)

## 2011-06-26 LAB — ETHANOL: Alcohol, Ethyl (B): <11 mg/dL (ref 0–11)

## 2011-06-26 LAB — SALICYLATE LEVEL: Salicylate Lvl: <2 mg/dL — ABNORMAL LOW (ref 2.8–20.0)

## 2011-06-26 MED ORDER — SODIUM CHLORIDE 0.9 % IV SOLN
INTRAVENOUS | Status: DC
  Administered 2011-06-26: 11:00:00 via INTRAVENOUS

## 2011-06-26 NOTE — Discharge Instructions (Signed)
Ian Grimes, you took an overdose of benzodiazepines last night when you were upset.  You had physical examination, laboratory tests, and consultation with Mrs. Hattie Perch, Behavioral Health specialist.  Mrs. Hyacinth Meeker advised that it is safe for you to go home.  You should take the medicines only in the way they are prescribed for you, not a lot at once.  Call Dr. Tiburcio Pea' office tomorrow to make a followup appointment to talk about why you were upset and how you took too many of your medicines.

## 2011-06-26 NOTE — ED Notes (Signed)
Pt DC to home. [place] [employer] called for pt per pt request.  Pt going to wait in waiting room for pt.

## 2011-06-26 NOTE — ED Notes (Signed)
Pt states he took 26 1 mg valium from 0300 until 0800. States voices were telling him to keep taking valium. States he has no plans to hurt himself or others

## 2011-06-26 NOTE — ED Provider Notes (Signed)
History   This chart was scribed for Osvaldo Human, MD by Toya Smothers. The patient was seen in room APA15/APA15. Patient's care was started at 1041.  CSN: [id number]  Arrival date & time 06/26/11  1041   First MD Initiated Contact with Patient 06/26/11 1048      Chief Complaint  Patient presents with  . V70.1    (Consider location/radiation/quality/duration/timing/severity/associated sxs/prior treatment) HPI Ian Grimes is a 41 y.o. male who presents to the Emergency Department complaining of gradual adverse auditory hallucinations onset last night ending 9 hours ago with associated symptoms of aggression, anger, slightly blurred vision. Pt states they heard "demonic voices" telling him to take 26 1mg  Valium, though he had no intention of harming himself. Patient denies use of illicit drug use, fever ear ache, sore throat, coughing, chest pain, emessis, dysuria, fainting, seizures, syncope, SI/HI.  Patient has h/o smoking and previous treatment at to [facility].     Past Medical History  Diagnosis Date  . Schizo-affective psychosis     Past Surgical History  Procedure Date  . Rib injury     History reviewed. No pertinent family history.  History  Substance Use Topics  . Smoking status: Current Everyday Smoker  . Smokeless tobacco: Not on file  . Alcohol Use: No      Review of Systems  Constitutional: Negative for fever and chills.  HENT: Negative for rhinorrhea and neck pain.   Eyes: Negative for pain.  Respiratory: Negative for cough and shortness of breath.   Cardiovascular: Negative for chest pain.  Gastrointestinal: Negative for nausea, vomiting, abdominal pain and diarrhea.  Genitourinary: Negative for dysuria.  Musculoskeletal: Negative for back pain.  Skin: Negative for rash.  Neurological: Negative for dizziness and weakness.  Psychiatric/Behavioral: Positive for suicidal ideas, hallucinations and agitation.       He  denies suicidal ideation, but admits taking a large dose of benzodiazepines.    Allergies  Bactrim  Home Medications   Current Outpatient Rx  Name Route Sig Dispense Refill  . ATORVASTATIN CALCIUM 40 MG PO TABS Oral Take 40 mg by mouth at bedtime.    Marland Kitchen DIAZEPAM 10 MG PO TABS Oral Take 10 mg by mouth 4 (four) times daily.    Marland Kitchen DIVALPROEX SODIUM 500 MG PO TBEC Oral Take 500 mg by mouth every morning.      . IBUPROFEN 200 MG PO TABS Oral Take 800 mg by mouth daily as needed. For pain     . OXYMETAZOLINE HCL 0.05 % NA SOLN Nasal Place 2 sprays into the nose as needed. For congestion     . SERTRALINE HCL 100 MG PO TABS Oral Take 150 mg by mouth every morning.     Marland Kitchen ZIPRASIDONE HCL 80 MG PO CAPS Oral Take 80 mg by mouth 2 (two) times daily with a meal.      BP 102/64  Pulse 64  Temp(Src) 98.4 F (36.9 C) (Oral)  Resp 20  Ht 6' (1.829 m)  Wt 192 lb (87.091 kg)  BMI 26.04 kg/m2  SpO2 97%  Physical Exam  Nursing note and vitals reviewed. Constitutional: He is oriented to person, place, and time. He appears well-developed and well-nourished. No distress.       Odor of tobacco on breath  HENT:  Head: Normocephalic and atraumatic.  Right Ear: External ear normal.  Left Ear: External ear normal.  Eyes: EOM are normal. Pupils are equal, round, and reactive to light.  Neck:  Neck supple. No tracheal deviation present.  Cardiovascular: Normal rate and regular rhythm.  Exam reveals no gallop and no friction rub.   No murmur heard. Pulmonary/Chest: Effort normal. No respiratory distress. He has no wheezes. He has no rales.  Abdominal: Soft. He exhibits no distension. There is no tenderness.  Musculoskeletal: Normal range of motion. He exhibits no edema.  Neurological: He is alert and oriented to person, place, and time. He has normal reflexes. No cranial nerve deficit or sensory deficit.  Skin: Skin is warm and dry.  Psychiatric: He has a normal mood and affect. He is agitated.    ED  Course  Procedures (including critical care time) DIAGNOSTIC STUDIES: Oxygen Saturation is 97% on room air, adequate by my interpretation.    COORDINATION OF CARE:   11:14 AM Pt seen --> physical exam performed.  Lab workup ordered.  Told RN was planning to leave, so will execute involuntary commitment papers on him.  11:26 AM  Date: 06/26/2011  Rate: 62  Rhythm: normal sinus rhythm  QRS Axis: normal  Intervals: normal  ST/T Wave abnormalities: normal  Conduction Disutrbances:none  Narrative Interpretation: Normal EKG  Old EKG Reviewed: unchanged  Results for orders placed during the hospital encounter of 06/26/11  CBC      Component Value Range   WBC 14.6 (*) 4.0 - 10.5 (K/uL)   RBC 4.73  4.22 - 5.81 (MIL/uL)   Hemoglobin 16.4  13.0 - 17.0 (g/dL)   HCT 40.9  81.1 - 91.4 (%)   MCV 98.5  78.0 - 100.0 (fL)   MCH 34.7 (*) 26.0 - 34.0 (pg)   MCHC 35.2  30.0 - 36.0 (g/dL)   RDW 78.2  95.6 - 21.3 (%)   Platelets 245  150 - 400 (K/uL)  DIFFERENTIAL      Component Value Range   Neutrophils Relative 70  43 - 77 (%)   Neutro Abs 10.2 (*) 1.7 - 7.7 (K/uL)   Lymphocytes Relative 23  12 - 46 (%)   Lymphs Abs 3.3  0.7 - 4.0 (K/uL)   Monocytes Relative 4  3 - 12 (%)   Monocytes Absolute 0.6  0.1 - 1.0 (K/uL)   Eosinophils Relative 3  0 - 5 (%)   Eosinophils Absolute 0.4  0.0 - 0.7 (K/uL)   Basophils Relative 1  0 - 1 (%)   Basophils Absolute 0.1  0.0 - 0.1 (K/uL)  URINE RAPID DRUG SCREEN (HOSP PERFORMED)      Component Value Range   Opiates NONE DETECTED  NONE DETECTED    Cocaine NONE DETECTED  NONE DETECTED    Benzodiazepines POSITIVE (*) NONE DETECTED    Amphetamines NONE DETECTED  NONE DETECTED    Tetrahydrocannabinol NONE DETECTED  NONE DETECTED    Barbiturates NONE DETECTED  NONE DETECTED   URINALYSIS, ROUTINE W REFLEX MICROSCOPIC      Component Value Range   Color, Urine STRAW (*) YELLOW    APPearance CLEAR  CLEAR    Specific Gravity, Urine <1.005 (*) 1.005 - 1.030      pH 5.5  5.0 - 8.0    Glucose, UA NEGATIVE  NEGATIVE (mg/dL)   Hgb urine dipstick NEGATIVE  NEGATIVE    Bilirubin Urine NEGATIVE  NEGATIVE    Ketones, ur NEGATIVE  NEGATIVE (mg/dL)   Protein, ur NEGATIVE  NEGATIVE (mg/dL)   Urobilinogen, UA 0.2  0.0 - 1.0 (mg/dL)   Nitrite NEGATIVE  NEGATIVE    Leukocytes, UA NEGATIVE  NEGATIVE  ETHANOL      Component Value Range   Alcohol, Ethyl (B) <11  0 - 11 (mg/dL)  ACETAMINOPHEN LEVEL      Component Value Range   Acetaminophen (Tylenol), Serum <15.0  10 - 30 (ug/mL)  SALICYLATE LEVEL      Component Value Range   Salicylate Lvl <2.0 (*) 2.8 - 20.0 (mg/dL)  VALPROIC ACID LEVEL      Component Value Range   Valproic Acid Lvl 14.5 (*) 50.0 - 100.0 (ug/mL)  TRICYCLICS SCREEN, URINE      Component Value Range   TCA Scrn NONE DETECTED  NONE DETECTED   COMPREHENSIVE METABOLIC PANEL      Component Value Range   Sodium 140  135 - 145 (mEq/L)   Potassium 4.0  3.5 - 5.1 (mEq/L)   Chloride 106  96 - 112 (mEq/L)   CO2 24  19 - 32 (mEq/L)   Glucose, Bld 98  70 - 99 (mg/dL)   BUN 12  6 - 23 (mg/dL)   Creatinine, Ser 9.81  0.50 - 1.35 (mg/dL)   Calcium 9.7  8.4 - 19.1 (mg/dL)   Total Protein 7.1  6.0 - 8.3 (g/dL)   Albumin 3.9  3.5 - 5.2 (g/dL)   AST 16  0 - 37 (U/L)   ALT 20  0 - 53 (U/L)   Alkaline Phosphatase 74  39 - 117 (U/L)   Total Bilirubin 0.2 (*) 0.3 - 1.2 (mg/dL)   GFR calc non Af Amer >90  >90 (mL/min)   GFR calc Af Amer >90  >90 (mL/min)    Dg Chest 2 View  06/26/2011  *RADIOLOGY REPORT*  Clinical Data: Benzodiazepine overdose.  Smoker.  CHEST - 2 VIEW  Comparison: 01/14/2011.  Findings: Normal sized heart.  Clear lungs.  The lungs remain mildly hyperexpanded with stable prominence of the interstitial markings.  Minimal scoliosis and minimal thoracic spine degenerative changes.  IMPRESSION: No acute abnormality.  Stable changes of COPD.  Original Report Authenticated By: Darrol Angel, M.D.   3:10 PM Pt was evaluated by Hattie Perch, Behavioral Health counselor, who did not think pt was suicidal, and had him sign a Energy manager.  He will followup with his psychiatrist in the office.   1. Benzodiazepine overdose   2. Schizoaffective disorder      I personally performed the services described in this documentation, which was scribed in my presence. The recorded information has been reviewed and considered.  Osvaldo Human, M.D.      Carleene Cooper III, MD 06/26/11 [phone]

## 2011-06-26 NOTE — ED Notes (Signed)
Pt refusing to take jeans and shoes off.  Pt states he is in his right mind and does not intend on taking his clothes off.  Pt advised that all pts in for treatment for certain medical conditions must be placed in paper scrubs.

## 2011-06-26 NOTE — ED Notes (Signed)
Ella at bedside to evaluate pt.  

## 2011-06-26 NOTE — BH Assessment (Signed)
Assessment Note   Ian Grimes is an 41 y.o. male .PT REPORTS HE WAS ON THE CHAT LINE LAST NIGHT AND FOUND OUT HE WAS TALKING TO A PERVERTED MAN INSTEAD OF A WOMAN AND HE BECAME UPSET, AGITATED IRRITABLE. HE REPORTED HE WAS ALREADY EXPERIENCING INSOMNIA FOR THE PAST WEEK  AND AFTER BEING UNABLE TO SLEEP HE THOUGHT HE HEARD A VOICE OR MAYBE IT WAS HIS OWN CONSCIOUS TELLING HIM TO TURN ON THE RADIO, TAKE HIS VALIUM TO SLEEP AND GET SOME REST AND HE TOOK 6  AFTER 1 HOUR PT REPORTS HE WAS STILL WIDE AWAKE AND AGAIN HE HEARD HIMSELF TELLING HIM TO TAKE MORE VALIUM TO SLEEP AND ON 2 MORE OCCASSIONS HOURS APART, PT TOOK 10 MORE VALIUM EACH TIME.  PT WAS STILL UNABLE TO SLEEP AND THIS MORNING HE CALLED HIS PASTOR TO PRAY FOR HIM AND PASTOR CALLED 911 TO HAVE PT PICKED UP AND EVALUATED. PT DENIES S/I, H/I AND IS NOT PSYCHOTIC. HE REPORTS HE CARRIES A DX OF SCHIZOAFFECTIVE D/O BUT IS USUALLY PARANOID AND DOES NOT HAVE A HISTORY OF A / V  HALLUCINATIONS. PT REPORTS HIS AUNT LIVES ABOUT 3 MILES AWAY FROM HIM AND HE WILL GIVE HER HIS MEDS TO GIVE HIM A 2 DAY SUPPLY AT A TIME. PT CONTRACTS FOR SAFETY AND IS TO FOLLOW UP WITH HIS CURRENT PROVIDER. DR Ignacia Palma AGREES WITH DISPOSITION.       Axis I: Schizoaffective Disorder Axis II: Deferred Axis III:  Past Medical History  Diagnosis Date  . Schizo-affective psychosis    Axis IV: other psychosocial or environmental problems Axis V: 51-60 moderate symptoms       Past Medical History:  Past Medical History  Diagnosis Date  . Schizo-affective psychosis     Past Surgical History  Procedure Date  . Rib injury     Family History: History reviewed. No pertinent family history.  Social History:  reports that he has been smoking.  He does not have any smokeless tobacco history on file. He reports that he does not drink alcohol or use illicit drugs.  Additional Social History:    Allergies:  Allergies  Allergen Reactions  . Bactrim Rash    Home  Medications:  (Not in a hospital admission)  OB/GYN Status:  No LMP for male patient.  General Assessment Data Location of Assessment: [facility] ED ACT Assessment: Yes Living Arrangements: Alone Can pt return to current living arrangement?: Yes Admission Status: Voluntary Is patient capable of signing voluntary admission?: Yes Transfer from: Acute Hospital Referral Source: MD (DR DAVIDSON)  Education Status Contact person: BETTY BULLINS-[phone]  Risk to self Suicidal Ideation: No Suicidal Intent: No Is patient at risk for suicide?: No Suicidal Plan?: No Access to Means: No What has been your use of drugs/alcohol within the last 12 months?: NA Previous Attempts/Gestures: No How many times?: 0  Other Self Harm Risks: NONE Triggers for Past Attempts: None known Intentional Self Injurious Behavior: None Family Suicide History: Yes (GRANDFATHER AND BROTHER) Recent stressful life event(s): Other (Comment) (CHATTING ON LINE WITH A MALE THOUGHT TO BE A MALE) Persecutory voices/beliefs?: No Depression: No Depression Symptoms:  (NONE) Substance abuse history and/or treatment for substance abuse?: No Suicide prevention information given to non-admitted patients: Not applicable  Risk to Others Homicidal Ideation: No Thoughts of Harm to Others: No Current Homicidal Intent: No Current Homicidal Plan: No Access to Homicidal Means: No Identified Victim: NA History of harm to others?: No Assessment of Violence: None Noted Violent  Behavior Description: NA Does patient have access to weapons?: No Criminal Charges Pending?: No Does patient have a court date: No  Psychosis Hallucinations: None noted Delusions: None noted  Mental Status Report Appear/Hygiene: Improved Eye Contact: Good Motor Activity: Freedom of movement Speech: Logical/coherent Level of Consciousness: Alert Mood: Other (Comment) (APPROPRIATE) Affect: Appropriate to circumstance Anxiety Level:  Minimal Thought Processes: Coherent;Relevant Judgement: Unimpaired Orientation: Person;Place;Time;Situation Obsessive Compulsive Thoughts/Behaviors: None  Cognitive Functioning Concentration: Normal Memory: Recent Intact;Remote Intact IQ: Average Insight: Good Impulse Control: Good Appetite: Good Sleep: Decreased Total Hours of Sleep: 3  Vegetative Symptoms: None  Prior Inpatient Therapy Prior Inpatient Therapy: Yes Prior Therapy Dates: 2008-9,2008, 2010  Prior Therapy Facilty/Provider(s): [facility], [facility], [facility] Reason for Treatment: PARANOID  Prior Outpatient Therapy Prior Outpatient Therapy: Yes Prior Therapy Dates: 2008-CURRENT [facility] AND DR Tiburcio Pea Prior Therapy Facilty/Provider(s): [facility]  CURRENTLY DR [facility]ARR[facility] Reason for Treatment: SCHIZOAFFECTIVE D/O            Values / Beliefs Cultural Requests During Hospitalization: None Spiritual Requests During Hospitalization: None        Additional Information 1:1 In Past 12 Months?: No CIRT Risk: No Elopement Risk: No Does patient have medical clearance?: Yes     Disposition: CURRENT PROVIDER-DR HARRIS,[facility] [place]. Disposition Disposition of Patient: Referred to Patient referred to: Other (Comment) (CURRENT PROVIDER)  On Site Evaluation by:   Reviewed with Physician:     Hattie Perch [place] 06/26/2011 1:59 PM

## 2011-06-26 NOTE — ED Notes (Addendum)
[place] [employer] called for pt.

## 2011-06-26 NOTE — ED Notes (Signed)
Patient transported to X-ray 

## 2011-06-28 DIAGNOSIS — F319 Bipolar disorder, unspecified: Secondary | ICD-10-CM | POA: Diagnosis not present

## 2011-06-28 DIAGNOSIS — F411 Generalized anxiety disorder: Secondary | ICD-10-CM | POA: Diagnosis not present

## 2011-07-04 DIAGNOSIS — F411 Generalized anxiety disorder: Secondary | ICD-10-CM | POA: Diagnosis not present

## 2011-07-04 DIAGNOSIS — F319 Bipolar disorder, unspecified: Secondary | ICD-10-CM | POA: Diagnosis not present

## 2011-07-26 DIAGNOSIS — R51 Headache: Secondary | ICD-10-CM | POA: Diagnosis not present

## 2011-07-26 DIAGNOSIS — E785 Hyperlipidemia, unspecified: Secondary | ICD-10-CM | POA: Diagnosis not present

## 2011-08-09 DIAGNOSIS — R51 Headache: Secondary | ICD-10-CM | POA: Diagnosis not present

## 2011-08-19 DIAGNOSIS — Z79899 Other long term (current) drug therapy: Secondary | ICD-10-CM | POA: Diagnosis not present

## 2011-08-19 DIAGNOSIS — F319 Bipolar disorder, unspecified: Secondary | ICD-10-CM | POA: Diagnosis not present

## 2011-08-19 DIAGNOSIS — F411 Generalized anxiety disorder: Secondary | ICD-10-CM | POA: Diagnosis not present

## 2011-08-19 DIAGNOSIS — F66 Other sexual disorders: Secondary | ICD-10-CM | POA: Diagnosis not present

## 2011-09-12 DIAGNOSIS — T887XXA Unspecified adverse effect of drug or medicament, initial encounter: Secondary | ICD-10-CM | POA: Diagnosis not present

## 2011-10-17 DIAGNOSIS — E785 Hyperlipidemia, unspecified: Secondary | ICD-10-CM | POA: Diagnosis not present

## 2011-10-17 DIAGNOSIS — R51 Headache: Secondary | ICD-10-CM | POA: Diagnosis not present

## 2011-11-03 ENCOUNTER — Emergency Department (HOSPITAL_COMMUNITY)
Admission: EM | Admit: 2011-11-03 | Discharge: 2011-11-03 | Disposition: A | Discharge status: Home / Self Care | Payer: Medicare Other | Attending: Emergency Medicine | Admitting: Emergency Medicine

## 2011-11-03 ENCOUNTER — Encounter (HOSPITAL_COMMUNITY): Payer: Self-pay | Admitting: *Deleted

## 2011-11-03 DIAGNOSIS — Z760 Encounter for issue of repeat prescription: Secondary | ICD-10-CM | POA: Insufficient documentation

## 2011-11-03 DIAGNOSIS — R259 Unspecified abnormal involuntary movements: Secondary | ICD-10-CM | POA: Diagnosis not present

## 2011-11-03 DIAGNOSIS — F259 Schizoaffective disorder, unspecified: Secondary | ICD-10-CM | POA: Insufficient documentation

## 2011-11-03 DIAGNOSIS — F411 Generalized anxiety disorder: Secondary | ICD-10-CM | POA: Insufficient documentation

## 2011-11-03 DIAGNOSIS — F17200 Nicotine dependence, unspecified, uncomplicated: Secondary | ICD-10-CM | POA: Insufficient documentation

## 2011-11-03 HISTORY — DX: Major depressive disorder, single episode, unspecified: F32.9

## 2011-11-03 HISTORY — DX: Depression, unspecified: F32.A

## 2011-11-03 HISTORY — DX: Anxiety disorder, unspecified: F41.9

## 2011-11-03 MED ORDER — CLONAZEPAM 1 MG PO TABS: 1.0000 mg | ORAL_TABLET | Freq: Three times a day (TID) | ORAL | Status: DC | PRN

## 2011-11-03 MED ORDER — CLONAZEPAM 0.5 MG PO TABS
1.0000 mg | ORAL_TABLET | Freq: Once | ORAL | Status: AC
  Administered 2011-11-03: 1 mg via ORAL
  Filled 2011-11-03: qty 2

## 2011-11-03 MED ORDER — SERTRALINE HCL 100 MG PO TABS: 100.0000 mg | ORAL_TABLET | Freq: Every day | ORAL | Status: DC

## 2011-11-03 NOTE — ED Notes (Signed)
Reports withdrawing from Zoloft and Klonipin x 2 days.  Reports Rx ran out and has been unable to get appt with PCP for refill.  Sx include unable to eat, sleep, focus.

## 2011-11-03 NOTE — ED Provider Notes (Signed)
History     CSN: [id number]  Arrival date & time 11/03/11  1309   First MD Initiated Contact with Patient 11/03/11 1326      Chief Complaint  Patient presents with  . Withdrawal    (Consider location/radiation/quality/duration/timing/severity/associated sxs/prior treatment) HPI Comments: Ian Grimes presents for assistance with medication refill.  He is currently being treated for depression and anxiety with Zoloft and Clonopin and left these 2 medications in [place] where he went to visit his sister.  His last dose of both of these medicines was 3 days ago.  He attempted to contact his provider, Carroll Sage with day [facility] for assistance with these medications to find out that she is no longer there and he will have to establish with a new provider, his appointment for this not occurring until the end of next month.  In the interim he is having problems with increasing anxiety and tremor, decreased ability to sleep decreased appetite and generalized inability to concentrate.  He denies nausea or vomiting, headache or abdominal pain.  He also denies homicidal or suicidal ideation and is having no visual or auditory hallucinations.  The history is provided by the patient.    Past Medical History  Diagnosis Date  . Schizo-affective psychosis   . Anxiety   . Depression     Past Surgical History  Procedure Date  . Rib injury     No family history on file.  History  Substance Use Topics  . Smoking status: Current Everyday Smoker  . Smokeless tobacco: Not on file  . Alcohol Use: No      Review of Systems  Constitutional: Negative for fever.  HENT: Negative for congestion, sore throat and neck pain.   Eyes: Negative.   Respiratory: Negative for chest tightness and shortness of breath.   Cardiovascular: Negative for chest pain.  Gastrointestinal: Negative for nausea and abdominal pain.  Genitourinary: Negative.   Musculoskeletal: Negative for joint swelling and arthralgias.    Skin: Negative.  Negative for rash and wound.  Neurological: Positive for tremors. Negative for dizziness, weakness, light-headedness, numbness and headaches.  Hematological: Negative.   Psychiatric/Behavioral: Positive for disturbed wake/sleep cycle and decreased concentration. Negative for suicidal ideas, hallucinations and self-injury. The patient is nervous/anxious.     Allergies  Bactrim  Home Medications   Current Outpatient Rx  Name Route Sig Dispense Refill  . ATORVASTATIN CALCIUM 40 MG PO TABS Oral Take 40 mg by mouth at bedtime.    Marland Kitchen CLONAZEPAM 1 MG PO TABS Oral Take 1 mg by mouth 4 (four) times daily.    . IBUPROFEN 200 MG PO TABS Oral Take 800 mg by mouth 2 (two) times daily as needed. For pain    . OXYMETAZOLINE HCL 0.05 % NA SOLN Nasal Place 2 sprays into the nose as needed. For congestion     . PALIPERIDONE ER 9 MG PO TB24 Oral Take 9 mg by mouth every morning.    Marland Kitchen CLONAZEPAM 1 MG PO TABS Oral Take 1 tablet (1 mg total) by mouth 3 (three) times daily as needed for anxiety. 40 tablet 0  . SERTRALINE HCL 100 MG PO TABS Oral Take 200 mg by mouth every morning.     Marland Kitchen SERTRALINE HCL 100 MG PO TABS Oral Take 1 tablet (100 mg total) by mouth daily. 30 tablet 0    BP 117/83  Pulse 95  Temp 98.1 F (36.7 C) (Oral)  Resp 16  Ht 6' (1.829 m)  [employer]  196 lb (88.905 kg)  BMI 26.58 kg/m2  SpO2 99%  Physical Exam  Nursing note and vitals reviewed. Constitutional: He is oriented to person, place, and time. He appears well-developed and well-nourished.       Uncomfortable appearing  HENT:  Head: Normocephalic and atraumatic.  Mouth/Throat: Oropharynx is clear and moist.  Eyes: EOM are normal. Pupils are equal, round, and reactive to light.  Neck: Neck supple.  Cardiovascular: Normal rate.   Pulmonary/Chest: Effort normal.  Musculoskeletal: Normal range of motion.  Neurological: He is alert and oriented to person, place, and time. He has normal strength. No cranial nerve  deficit or sensory deficit. Gait normal. GCS eye subscore is 4. GCS verbal subscore is 5. GCS motor subscore is 6.  Skin: Skin is warm and dry. No rash noted.  Psychiatric: His behavior is normal. Thought content normal. His mood appears anxious. His affect is not angry and not inappropriate. His speech is not rapid and/or pressured. Cognition and memory are normal.    ED Course  Procedures (including critical care time)  Labs Reviewed - No data to display No results found.   1. Medication refill       MDM  Patient prescribed a limited supply of  Zoloft and Clonopin.  He was encouraged to keep his appointment with day [facility] as scheduled.  Advised to see his PCP for further medication refills if he needs this prior to getting back in with [facility].    Burgess Amor, [place] 11/03/11 [phone]

## 2011-11-04 NOTE — ED Provider Notes (Signed)
Medical screening examination/treatment/procedure(s) were performed by non-physician practitioner and as supervising physician I was immediately available for consultation/collaboration.   Carleene Cooper III, MD 11/04/11 [phone]

## 2011-11-19 DIAGNOSIS — F411 Generalized anxiety disorder: Secondary | ICD-10-CM | POA: Diagnosis not present

## 2011-11-19 DIAGNOSIS — F319 Bipolar disorder, unspecified: Secondary | ICD-10-CM | POA: Diagnosis not present

## 2011-11-28 DIAGNOSIS — G43009 Migraine without aura, not intractable, without status migrainosus: Secondary | ICD-10-CM | POA: Diagnosis not present

## 2011-12-13 DIAGNOSIS — F259 Schizoaffective disorder, unspecified: Secondary | ICD-10-CM | POA: Diagnosis not present

## 2011-12-29 DIAGNOSIS — F39 Unspecified mood [affective] disorder: Secondary | ICD-10-CM | POA: Diagnosis not present

## 2012-01-16 DIAGNOSIS — E785 Hyperlipidemia, unspecified: Secondary | ICD-10-CM | POA: Diagnosis not present

## 2012-01-16 DIAGNOSIS — J019 Acute sinusitis, unspecified: Secondary | ICD-10-CM | POA: Diagnosis not present

## 2012-01-16 DIAGNOSIS — F411 Generalized anxiety disorder: Secondary | ICD-10-CM | POA: Diagnosis not present

## 2012-01-31 DIAGNOSIS — F259 Schizoaffective disorder, unspecified: Secondary | ICD-10-CM | POA: Diagnosis not present

## 2012-04-26 DIAGNOSIS — F259 Schizoaffective disorder, unspecified: Secondary | ICD-10-CM | POA: Diagnosis not present

## 2012-07-17 ENCOUNTER — Encounter: Payer: Self-pay | Admitting: Nurse Practitioner

## 2012-07-17 ENCOUNTER — Ambulatory Visit (INDEPENDENT_AMBULATORY_CARE_PROVIDER_SITE_OTHER): Payer: Medicare Other | Admitting: Nurse Practitioner

## 2012-07-17 VITALS — BP 110/80 | HR 88 | Temp 97.9°F | Ht 72.0 in | Wt 216.0 lb

## 2012-07-17 DIAGNOSIS — F250 Schizoaffective disorder, bipolar type: Secondary | ICD-10-CM | POA: Insufficient documentation

## 2012-07-17 DIAGNOSIS — E785 Hyperlipidemia, unspecified: Secondary | ICD-10-CM

## 2012-07-17 DIAGNOSIS — F259 Schizoaffective disorder, unspecified: Secondary | ICD-10-CM | POA: Diagnosis not present

## 2012-07-17 DIAGNOSIS — F329 Major depressive disorder, single episode, unspecified: Secondary | ICD-10-CM | POA: Diagnosis not present

## 2012-07-17 LAB — COMPLETE METABOLIC PANEL WITH GFR
ALT: 28 U/L (ref 0–53)
AST: 21 U/L (ref 0–37)
Albumin: 5.1 g/dL (ref 3.5–5.2)
Alkaline Phosphatase: 73 U/L (ref 39–117)
BUN: 22 mg/dL (ref 6–23)
Potassium: 4.8 mEq/L (ref 3.5–5.3)
Sodium: 140 mEq/L (ref 135–145)
Total Protein: 7.2 g/dL (ref 6.0–8.3)

## 2012-07-17 NOTE — Progress Notes (Signed)
  Subjective:    Patient ID: Ian Grimes, male    DOB: 03/24/1970, 42 y.o.   MRN: [id number]  Hyperlipidemia This is a chronic problem. The current episode started more than 1 year ago. The problem is uncontrolled. Recent lipid tests were reviewed and are high. He has no history of hypothyroidism, liver disease, obesity or nephrotic syndrome. There are no known factors aggravating his hyperlipidemia. Pertinent negatives include no leg pain, myalgias or shortness of breath. Current antihyperlipidemic treatment includes statins and fibric acid derivatives. The current treatment provides moderate improvement of lipids. Compliance problems include adherence to diet and adherence to exercise.   schizoaffective disorder Patient sees dr. Cherrie Distance in [place]- Only on Zoloft and valium- says he is doing ok- Only c/o fatigue- Says that he has no energy to do anything. Sinus headaches Takes decongestant occasionally. Patient admits to drinking caffeine- soft drinks all day.    Review of Systems  Constitutional: Negative.   HENT: Negative.   Eyes: Negative.   Respiratory: Negative.  Negative for shortness of breath.   Endocrine: Negative.   Genitourinary: Negative.   Musculoskeletal: Negative for myalgias.  Neurological: Negative.   Hematological: Negative.   Psychiatric/Behavioral: Positive for agitation. The patient is hyperactive.        Objective:   Physical Exam  Constitutional: He is oriented to person, place, and time. He appears well-developed and well-nourished.  HENT:  Head: Normocephalic.  Right Ear: External ear normal.  Left Ear: External ear normal.  Nose: Nose normal.  Mouth/Throat: Oropharynx is clear and moist.  Eyes: EOM are normal. Pupils are equal, round, and reactive to light.  Neck: Normal range of motion. Neck supple. No thyromegaly present.  Cardiovascular: Normal rate, regular rhythm, normal heart sounds and intact distal pulses.   No murmur  heard. Pulmonary/Chest: Effort normal and breath sounds normal. He has no wheezes. He has no rales.  Abdominal: Soft. Bowel sounds are normal.  Genitourinary: Prostate normal and penis normal.  Musculoskeletal: Normal range of motion.  Neurological: He is alert and oriented to person, place, and time.  Skin: Skin is warm and dry.  Psychiatric: He has a normal mood and affect. His behavior is normal. Judgment and thought content normal.  Good eye contact answers all questions appropriately.   BP 110/80  Pulse 88  Temp(Src) 97.9 F (36.6 C) (Oral)  Ht 6' (1.829 m)  Wt 216 lb (97.977 kg)  BMI 29.29 kg/m2        Assessment & Plan:  1. Depression Stress management Exercise daily  2. Schizoaffective disorder Keep F/U appointments with Psych Discuss weight gain and fatigue with psych- Se of zoloft and valium  3. Hyperlipidemia Low fat diet and exercise - COMPLETE METABOLIC PANEL WITH GFR - NMR Lipoprofile with Lipids  4. Headaches Decrease caffeine consumption Keep headache diary Motrin otc as needed  Mary-Margaret Daphine Deutscher, FNP

## 2012-07-17 NOTE — Patient Instructions (Signed)
Sources of Caffeine Coffee / Caffeine (mg)  Coffee, brewed, 8 oz / 100 to 200 mg  Coffee, instant, 8 oz / 27 to 173 mg  Espresso, 1 oz / 75 mg Decaffeinated coffee has 97% of its caffeine removed. Tea / Caffeine (mg)  Tea, brewed, 8 oz / 40 to 120 mg Caffeine content depends upon the type of tea. Other / Caffeine (mg)  Cola, 12 oz / 35 mg  Chocolate bar, 1.55 oz / 9 mg  Energy drinks, supplements, and pain medicine vary depending on brand. Check the label or with the manufacturer for exact amounts of caffeine. Document Released: 02/12/2000 Document Revised: 05/09/2011 Document Reviewed: 05/14/2010 [employer] Patient Information 2013 [employer].  

## 2012-07-19 ENCOUNTER — Other Ambulatory Visit: Payer: Self-pay

## 2012-07-19 ENCOUNTER — Ambulatory Visit: Payer: Self-pay | Admitting: Nurse Practitioner

## 2012-07-19 MED ORDER — FENOFIBRATE MICRONIZED 130 MG PO CAPS: 130.0000 mg | ORAL_CAPSULE | Freq: Every day | ORAL | Status: DC

## 2012-07-20 LAB — NMR LIPOPROFILE WITH LIPIDS
Cholesterol, Total: 184 mg/dL (ref <200)
HDL Particle Number: 32.1 umol/L (ref >=30.5)
LDL Particle Number: 1739 nmol/L — ABNORMAL HIGH (ref <1000)
Large VLDL-P: 10.6 nmol/L — ABNORMAL HIGH (ref <=2.7)
Triglycerides: 315 mg/dL — ABNORMAL HIGH (ref <150)
VLDL Size: 52.2 nm — ABNORMAL HIGH (ref <=46.6)

## 2012-07-24 ENCOUNTER — Telehealth: Payer: Self-pay | Admitting: Nurse Practitioner

## 2012-07-24 ENCOUNTER — Other Ambulatory Visit: Payer: Self-pay | Admitting: Nurse Practitioner

## 2012-07-24 MED ORDER — EZETIMIBE 10 MG PO TABS: 10.0000 mg | ORAL_TABLET | Freq: Every day | ORAL | Status: DC

## 2012-07-24 NOTE — Telephone Encounter (Signed)
Patient notified. See lab result note 

## 2012-08-01 DIAGNOSIS — F259 Schizoaffective disorder, unspecified: Secondary | ICD-10-CM | POA: Diagnosis not present

## 2012-08-07 ENCOUNTER — Telehealth: Payer: Self-pay | Admitting: Nurse Practitioner

## 2012-08-07 DIAGNOSIS — M722 Plantar fascial fibromatosis: Secondary | ICD-10-CM | POA: Diagnosis not present

## 2012-08-07 DIAGNOSIS — G5760 Lesion of plantar nerve, unspecified lower limb: Secondary | ICD-10-CM | POA: Diagnosis not present

## 2012-08-07 NOTE — Telephone Encounter (Signed)
HAS FOOT PAIN X 1 YEAR. DECIDED TO GO SOMEWHERE ELSE TO BE SEEN SOONER.

## 2012-08-22 ENCOUNTER — Telehealth: Payer: Self-pay | Admitting: Nurse Practitioner

## 2012-08-22 NOTE — Telephone Encounter (Signed)
Done earlier today

## 2012-10-19 ENCOUNTER — Other Ambulatory Visit: Payer: Self-pay | Admitting: Nurse Practitioner

## 2012-10-24 ENCOUNTER — Ambulatory Visit: Payer: Medicare Other | Admitting: Nurse Practitioner

## 2012-11-01 DIAGNOSIS — F259 Schizoaffective disorder, unspecified: Secondary | ICD-10-CM | POA: Diagnosis not present

## 2012-12-03 ENCOUNTER — Other Ambulatory Visit: Payer: Self-pay

## 2013-01-11 DIAGNOSIS — IMO0002 Reserved for concepts with insufficient information to code with codable children: Secondary | ICD-10-CM | POA: Diagnosis not present

## 2013-01-17 DIAGNOSIS — F259 Schizoaffective disorder, unspecified: Secondary | ICD-10-CM | POA: Diagnosis not present

## 2013-01-18 ENCOUNTER — Ambulatory Visit (INDEPENDENT_AMBULATORY_CARE_PROVIDER_SITE_OTHER): Payer: Medicare Other | Admitting: Nurse Practitioner

## 2013-01-18 ENCOUNTER — Encounter: Payer: Self-pay | Admitting: Nurse Practitioner

## 2013-01-18 VITALS — BP 124/81 | HR 94 | Temp 97.2°F | Ht 72.0 in | Wt 235.0 lb

## 2013-01-18 DIAGNOSIS — E785 Hyperlipidemia, unspecified: Secondary | ICD-10-CM | POA: Diagnosis not present

## 2013-01-18 MED ORDER — FENOFIBRATE MICRONIZED 130 MG PO CAPS: 130.0000 mg | ORAL_CAPSULE | Freq: Every day | ORAL | Status: DC

## 2013-01-18 MED ORDER — PRAVASTATIN SODIUM 40 MG PO TABS: 40.0000 mg | ORAL_TABLET | Freq: Every day | ORAL | Status: DC

## 2013-01-18 NOTE — Patient Instructions (Signed)
Fat and Cholesterol Control Diet  Fat and cholesterol levels in your blood and organs are influenced by your diet. High levels of fat and cholesterol may lead to diseases of the heart, small and large blood vessels, gallbladder, liver, and pancreas.  CONTROLLING FAT AND CHOLESTEROL WITH DIET  Although exercise and lifestyle factors are important, your diet is key. That is because certain foods are known to raise cholesterol and others to lower it. The goal is to balance foods for their effect on cholesterol and more importantly, to replace saturated and trans fat with other types of fat, such as monounsaturated fat, polyunsaturated fat, and omega-3 fatty acids.  On average, a person should consume no more than 15 to 17 g of saturated fat daily. Saturated and trans fats are considered "bad" fats, and they will raise LDL cholesterol. Saturated fats are primarily found in animal products such as meats, butter, and cream. However, that does not mean you need to give up all your favorite foods. Today, there are good tasting, low-fat, low-cholesterol substitutes for most of the things you like to eat. Choose low-fat or nonfat alternatives. Choose round or loin cuts of red meat. These types of cuts are lowest in fat and cholesterol. Chicken (without the skin), fish, veal, and ground turkey breast are great choices. Eliminate fatty meats, such as hot dogs and salami. Even shellfish have little or no saturated fat. Have a 3 oz (85 g) portion when you eat lean meat, poultry, or fish.  Trans fats are also called "partially hydrogenated oils." They are oils that have been scientifically manipulated so that they are solid at room temperature resulting in a longer shelf life and improved taste and texture of foods in which they are added. Trans fats are found in stick margarine, some tub margarines, cookies, crackers, and baked goods.   When baking and cooking, oils are a great substitute for butter. The monounsaturated oils are  especially beneficial since it is believed they lower LDL and raise HDL. The oils you should avoid entirely are saturated tropical oils, such as coconut and palm.   Remember to eat a lot from food groups that are naturally free of saturated and trans fat, including fish, fruit, vegetables, beans, grains (barley, rice, couscous, bulgur wheat), and pasta (without cream sauces).   IDENTIFYING FOODS THAT LOWER FAT AND CHOLESTEROL   Soluble fiber may lower your cholesterol. This type of fiber is found in fruits such as apples, vegetables such as broccoli, potatoes, and carrots, legumes such as beans, peas, and lentils, and grains such as barley. Foods fortified with plant sterols (phytosterol) may also lower cholesterol. You should eat at least 2 g per day of these foods for a cholesterol lowering effect.   Read package labels to identify low-saturated fats, trans fat free, and low-fat foods at the supermarket. Select cheeses that have only 2 to 3 g saturated fat per ounce. Use a heart-healthy tub margarine that is free of trans fats or partially hydrogenated oil. When buying baked goods (cookies, crackers), avoid partially hydrogenated oils. Breads and muffins should be made from whole grains (whole-wheat or whole oat flour, instead of "flour" or "enriched flour"). Buy non-creamy canned soups with reduced salt and no added fats.   FOOD PREPARATION TECHNIQUES   Never deep-fry. If you must fry, either stir-fry, which uses very little fat, or use non-stick cooking sprays. When possible, broil, bake, or roast meats, and steam vegetables. Instead of putting butter or margarine on vegetables, use lemon   and herbs, applesauce, and cinnamon (for squash and sweet potatoes). Use nonfat yogurt, salsa, and low-fat dressings for salads.   LOW-SATURATED FAT / LOW-FAT FOOD SUBSTITUTES  Meats / Saturated Fat (g)  · Avoid: Steak, marbled (3 oz/85 g) / 11 g  · Choose: Steak, lean (3 oz/85 g) / 4 g  · Avoid: Hamburger (3 oz/85 g) / 7  g  · Choose: Hamburger, lean (3 oz/85 g) / 5 g  · Avoid: Ham (3 oz/85 g) / 6 g  · Choose: Ham, lean cut (3 oz/85 g) / 2.4 g  · Avoid: Chicken, with skin, dark meat (3 oz/85 g) / 4 g  · Choose: Chicken, skin removed, dark meat (3 oz/85 g) / 2 g  · Avoid: Chicken, with skin, light meat (3 oz/85 g) / 2.5 g  · Choose: Chicken, skin removed, light meat (3 oz/85 g) / 1 g  Dairy / Saturated Fat (g)  · Avoid: Whole milk (1 cup) / 5 g  · Choose: Low-fat milk, 2% (1 cup) / 3 g  · Choose: Low-fat milk, 1% (1 cup) / 1.5 g  · Choose: Skim milk (1 cup) / 0.3 g  · Avoid: Hard cheese (1 oz/28 g) / 6 g  · Choose: Skim milk cheese (1 oz/28 g) / 2 to 3 g  · Avoid: Cottage cheese, 4% fat (1 cup) / 6.5 g  · Choose: Low-fat cottage cheese, 1% fat (1 cup) / 1.5 g  · Avoid: Ice cream (1 cup) / 9 g  · Choose: Sherbet (1 cup) / 2.5 g  · Choose: Nonfat frozen yogurt (1 cup) / 0.3 g  · Choose: Frozen fruit bar / trace  · Avoid: Whipped cream (1 tbs) / 3.5 g  · Choose: Nondairy whipped topping (1 tbs) / 1 g  Condiments / Saturated Fat (g)  · Avoid: Mayonnaise (1 tbs) / 2 g  · Choose: Low-fat mayonnaise (1 tbs) / 1 g  · Avoid: Butter (1 tbs) / 7 g  · Choose: Extra light margarine (1 tbs) / 1 g  · Avoid: Coconut oil (1 tbs) / 11.8 g  · Choose: Olive oil (1 tbs) / 1.8 g  · Choose: Corn oil (1 tbs) / 1.7 g  · Choose: Safflower oil (1 tbs) / 1.2 g  · Choose: Sunflower oil (1 tbs) / 1.4 g  · Choose: Soybean oil (1 tbs) / 2.4 g  · Choose: Canola oil (1 tbs) / 1 g  Document Released: 02/14/2005 Document Revised: 06/11/2012 Document Reviewed: 08/05/2010  [employer]® Patient Information ©2014 [employer].

## 2013-01-18 NOTE — Progress Notes (Signed)
Subjective:    Patient ID: Ian Grimes, male    DOB: November 30, 1970, 42 y.o.   MRN: [id number]  Hyperlipidemia This is a chronic problem. The current episode started more than 1 year ago. The problem is uncontrolled. Recent lipid tests were reviewed and are high. He has no history of hypothyroidism, liver disease, obesity or nephrotic syndrome. There are no known factors aggravating his hyperlipidemia. Pertinent negatives include no leg pain, myalgias or shortness of breath. Current antihyperlipidemic treatment includes statins and fibric acid derivatives. The current treatment provides moderate improvement of lipids. Compliance problems include adherence to diet and adherence to exercise.   schizoaffective disorder Patient sees dr. Cherrie Distance in [place]- Only on Zoloft and valium- says he is doing ok- Only c/o fatigue- Says that he has no energy to do anything. Back pain Patient fell 2 weeks ago- tripped over a space heater- back hurting some - went to ER_ on naproxyn and hydrocodone.  Review of Systems  Constitutional: Negative.   HENT: Negative.   Eyes: Negative.   Respiratory: Negative.  Negative for shortness of breath.   Cardiovascular: Negative.   Gastrointestinal: Negative.   Endocrine: Negative.   Genitourinary: Negative.   Musculoskeletal: Positive for back pain. Negative for myalgias.  Neurological: Negative.   Hematological: Negative.   Psychiatric/Behavioral: Positive for agitation. The patient is hyperactive.   All other systems reviewed and are negative.       Objective:   Physical Exam  Constitutional: He is oriented to person, place, and time. He appears well-developed and well-nourished.  HENT:  Head: Normocephalic.  Right Ear: External ear normal.  Left Ear: External ear normal.  Nose: Nose normal.  Mouth/Throat: Oropharynx is clear and moist.  Eyes: EOM are normal. Pupils are equal, round, and reactive to light.  Neck: Normal range of motion. Neck supple.  No JVD present. No thyromegaly present.  Cardiovascular: Normal rate, regular rhythm, normal heart sounds and intact distal pulses.  Exam reveals no gallop and no friction rub.   No murmur heard. Pulmonary/Chest: Effort normal and breath sounds normal. No respiratory distress. He has no wheezes. He has no rales. He exhibits no tenderness.  Abdominal: Soft. Bowel sounds are normal. He exhibits no mass. There is no tenderness.  Genitourinary: Prostate normal and penis normal.  Musculoskeletal: Normal range of motion. He exhibits no edema.  Lymphadenopathy:    He has no cervical adenopathy.  Neurological: He is alert and oriented to person, place, and time. No cranial nerve deficit.  Skin: Skin is warm and dry.  Psychiatric: He has a normal mood and affect. His behavior is normal. Judgment and thought content normal.  Good eye contact answers all questions appropriately.   BP 124/81  Pulse 94  Temp(Src) 97.2 F (36.2 C) (Oral)  Ht 6' (1.829 m)  Wt 235 lb (106.595 kg)  BMI 31.86 kg/m2        Assessment & Plan:   1. Hyperlipidemia LDL goal < 100    Orders Placed This Encounter  Procedures  . CMP14+EGFR  . NMR, lipoprofile   Meds ordered this encounter  Medications  . clonazePAM (KLONOPIN) 1 MG tablet    Sig:   . naproxen (NAPROSYN) 500 MG tablet    Sig:   . risperidone (RISPERDAL) 4 MG tablet    Sig:   . sertraline (ZOLOFT) 100 MG tablet    Sig: Take 150 mg by mouth daily.  . pravastatin (PRAVACHOL) 40 MG tablet    Sig: Take 1 tablet (40  mg total) by mouth daily.    Dispense:  30 tablet    Refill:  5    Order Specific Question:  Supervising Provider    Answer:  Ernestina Penna [[id number]]  . fenofibrate micronized (ANTARA) 130 MG capsule    Sig: Take 1 capsule (130 mg total) by mouth daily before breakfast.    Dispense:  30 capsule    Refill:  5    Order Specific Question:  Supervising Provider    Answer:  Deborra Medina    Continue all meds Labs  pending Diet and exercise encouraged Health maintenance reviewed Follow up in 3 months  Mary-Margaret Daphine Deutscher, FNP

## 2013-01-19 LAB — CMP14+EGFR
Albumin: 4.8 g/dL (ref 3.5–5.5)
Alkaline Phosphatase: 63 IU/L (ref 39–117)
BUN/Creatinine Ratio: 10 (ref 9–20)
BUN: 11 mg/dL (ref 6–24)
CO2: 21 mmol/L (ref 18–29)
Calcium: 9.6 mg/dL (ref 8.7–10.2)
Creatinine, Ser: 1.06 mg/dL (ref 0.76–1.27)
Globulin, Total: 2 g/dL (ref 1.5–4.5)

## 2013-01-19 LAB — NMR, LIPOPROFILE
Cholesterol: 161 mg/dL (ref <200)
HDL Cholesterol by NMR: 26 mg/dL — ABNORMAL LOW (ref >=40)
HDL Particle Number: 23.3 umol/L — ABNORMAL LOW (ref >=30.5)
LDLC SERPL CALC-MCNC: 58 mg/dL (ref <100)

## 2013-01-22 ENCOUNTER — Telehealth: Payer: Self-pay | Admitting: Nurse Practitioner

## 2013-01-23 ENCOUNTER — Other Ambulatory Visit: Payer: Self-pay | Admitting: Nurse Practitioner

## 2013-01-23 MED ORDER — ROSUVASTATIN CALCIUM 10 MG PO TABS: 10.0000 mg | ORAL_TABLET | Freq: Every day | ORAL | Status: DC

## 2013-01-23 NOTE — Telephone Encounter (Signed)
Aware. 

## 2013-01-28 ENCOUNTER — Telehealth: Payer: Self-pay | Admitting: Nurse Practitioner

## 2013-01-28 MED ORDER — ROSUVASTATIN CALCIUM 10 MG PO TABS: 10.0000 mg | ORAL_TABLET | Freq: Every day | ORAL | Status: DC

## 2013-01-28 NOTE — Telephone Encounter (Signed)
Please resend

## 2013-01-28 NOTE — Telephone Encounter (Signed)
crestor 10 mg daily rx was sent on 01/23/13 to [facility] pharmacy with statement of reciept

## 2013-03-04 DIAGNOSIS — R071 Chest pain on breathing: Secondary | ICD-10-CM | POA: Diagnosis not present

## 2013-03-04 DIAGNOSIS — R05 Cough: Secondary | ICD-10-CM | POA: Diagnosis not present

## 2013-03-04 DIAGNOSIS — R059 Cough, unspecified: Secondary | ICD-10-CM | POA: Diagnosis not present

## 2013-03-04 DIAGNOSIS — D169 Benign neoplasm of bone and articular cartilage, unspecified: Secondary | ICD-10-CM | POA: Diagnosis not present

## 2013-04-19 DIAGNOSIS — F259 Schizoaffective disorder, unspecified: Secondary | ICD-10-CM | POA: Diagnosis not present

## 2013-04-22 ENCOUNTER — Ambulatory Visit: Payer: Medicare Other | Admitting: Family Medicine

## 2013-04-22 ENCOUNTER — Ambulatory Visit: Payer: Medicare Other | Admitting: Nurse Practitioner

## 2013-04-30 ENCOUNTER — Ambulatory Visit (INDEPENDENT_AMBULATORY_CARE_PROVIDER_SITE_OTHER): Payer: Medicare Other

## 2013-04-30 ENCOUNTER — Ambulatory Visit (INDEPENDENT_AMBULATORY_CARE_PROVIDER_SITE_OTHER): Payer: Medicare Other | Admitting: Family Medicine

## 2013-04-30 VITALS — BP 107/77 | HR 108 | Temp 96.7°F | Ht 72.0 in | Wt 217.0 lb

## 2013-04-30 DIAGNOSIS — R42 Dizziness and giddiness: Secondary | ICD-10-CM

## 2013-04-30 DIAGNOSIS — E538 Deficiency of other specified B group vitamins: Secondary | ICD-10-CM | POA: Diagnosis not present

## 2013-04-30 DIAGNOSIS — E559 Vitamin D deficiency, unspecified: Secondary | ICD-10-CM

## 2013-04-30 DIAGNOSIS — R5383 Other fatigue: Secondary | ICD-10-CM

## 2013-04-30 DIAGNOSIS — J209 Acute bronchitis, unspecified: Secondary | ICD-10-CM | POA: Diagnosis not present

## 2013-04-30 DIAGNOSIS — E785 Hyperlipidemia, unspecified: Secondary | ICD-10-CM

## 2013-04-30 DIAGNOSIS — R5381 Other malaise: Secondary | ICD-10-CM

## 2013-04-30 DIAGNOSIS — Z0000 Encounter for general adult medical examination without abnormal findings: Secondary | ICD-10-CM

## 2013-04-30 DIAGNOSIS — Z125 Encounter for screening for malignant neoplasm of prostate: Secondary | ICD-10-CM | POA: Diagnosis not present

## 2013-04-30 DIAGNOSIS — R531 Weakness: Secondary | ICD-10-CM

## 2013-04-30 LAB — POCT CBC
Granulocyte percent: 81.8 %G — AB (ref 37–80)
HCT, POC: 49.8 % (ref 43.5–53.7)
Hemoglobin: 16.2 g/dL (ref 14.1–18.1)
Lymph, poc: 2.4 (ref 0.6–3.4)
MCH, POC: 32.1 pg — AB (ref 27–31.2)
MCHC: 32.5 g/dL (ref 31.8–35.4)
MCV: 98.9 fL — AB (ref 80–97)
MPV: 8.2 fL (ref 0–99.8)
POC Granulocyte: 12.2 — AB (ref 2–6.9)
POC LYMPH PERCENT: 16.4 %L (ref 10–50)
Platelet Count, POC: 287 10*3/uL (ref 142–424)
RBC: 5 M/uL (ref 4.69–6.13)
RDW, POC: 13.1 %
WBC: 14.9 10*3/uL — AB (ref 4.6–10.2)

## 2013-04-30 MED ORDER — PRAVASTATIN SODIUM 40 MG PO TABS: 40.0000 mg | ORAL_TABLET | Freq: Every day | ORAL | Status: DC

## 2013-04-30 MED ORDER — AMOXICILLIN 875 MG PO TABS: 875.0000 mg | ORAL_TABLET | Freq: Two times a day (BID) | ORAL | Status: DC

## 2013-04-30 MED ORDER — PREDNISONE 20 MG PO TABS: 20.0000 mg | ORAL_TABLET | Freq: Every day | ORAL | Status: DC

## 2013-04-30 NOTE — Progress Notes (Signed)
   Subjective:    Patient ID: Ian Grimes, male    DOB: March 24, 1970, 43 y.o.   MRN: [id number]  HPI  This 43 y.o. male presents for evaluation of routine follow up. He has hx of hyperlipidemia, psychiatric Illness, arthralgias, myalgias, loss of balance, and weakness.  He quit taking the crestor and he feels some better.  He c/o shortness of breath and cough.  Review of Systems No chest pain, SOB, HA, dizziness, vision change, N/V, diarrhea, constipation, dysuria, urinary urgency or frequency, myalgias, arthralgias or rash.     Objective:   Physical Exam  Vital signs noted  Well developed well nourished male.  HEENT - Head atraumatic Normocephalic                Eyes - PERRLA, Conjuctiva - clear Sclera- Clear EOMI                Ears - EAC's Wnl TM's Wnl Gross Hearing WNL                Nose - Nares patent                 Throat - oropharanx wnl Respiratory - Lungs CTA bilateral Cardiac - RRR S1 and S2 without murmur GI - Abdomen soft Nontender and bowel sounds active x 4 Extremities - No edema. Neuro - Grossly intact.  CXR - NO infiltrates Prelimnary reading by Iverson Alamin    Assessment & Plan:  Acute bronchitis - Plan: amoxicillin (AMOXIL) 875 MG tablet, predniSONE (DELTASONE) 20 MG tablet, DG Chest 2 View  Other and unspecified hyperlipidemia - Plan: pravastatin (PRAVACHOL) 40 MG tablet, Lipid panel  Weakness - Plan: POCT CBC, CMP14+EGFR, Vitamin B12, Vit D  25 hydroxy (rtn osteoporosis monitoring), Lipid panel, PSA, total and free  Dizziness and giddiness - Plan: POCT CBC, CMP14+EGFR, Vitamin B12, Vit D  25 hydroxy (rtn osteoporosis monitoring), Lipid panel, PSA, total and free  Unspecified vitamin D deficiency - Plan: Vit D  25 hydroxy (rtn osteoporosis monitoring)  B12 deficiency - Plan: Vitamin B12  Routine general medical examination at a health care facility - Plan: POCT CBC, CMP14+EGFR, Vitamin B12, Vit D  25 hydroxy (rtn osteoporosis monitoring),  Lipid panel, PSA, total and free  Lysbeth Penner FNP

## 2013-05-01 ENCOUNTER — Other Ambulatory Visit: Payer: Self-pay | Admitting: Family Medicine

## 2013-05-01 DIAGNOSIS — R918 Other nonspecific abnormal finding of lung field: Secondary | ICD-10-CM

## 2013-05-01 LAB — CMP14+EGFR
ALT: 24 IU/L (ref 0–44)
AST: 37 IU/L (ref 0–40)
Albumin/Globulin Ratio: 1.9 (ref 1.1–2.5)
Albumin: 4.6 g/dL (ref 3.5–5.5)
Alkaline Phosphatase: 70 IU/L (ref 39–117)
BUN/Creatinine Ratio: 10 (ref 9–20)
BUN: 10 mg/dL (ref 6–24)
CO2: 20 mmol/L (ref 18–29)
Calcium: 9.8 mg/dL (ref 8.7–10.2)
Chloride: 100 mmol/L (ref 97–108)
Creatinine, Ser: 1.05 mg/dL (ref 0.76–1.27)
GFR calc Af Amer: 101 mL/min/{1.73_m2} (ref >59)
GFR calc non Af Amer: 87 mL/min/{1.73_m2} (ref >59)
Globulin, Total: 2.4 g/dL (ref 1.5–4.5)
Glucose: 88 mg/dL (ref 65–99)
Potassium: 4.7 mmol/L (ref 3.5–5.2)
Sodium: 141 mmol/L (ref 134–144)
Total Bilirubin: 0.3 mg/dL (ref 0.0–1.2)
Total Protein: 7 g/dL (ref 6.0–8.5)

## 2013-05-01 LAB — VITAMIN D 25 HYDROXY (VIT D DEFICIENCY, FRACTURES): Vit D, 25-Hydroxy: 9.4 ng/mL — ABNORMAL LOW (ref 30.0–100.0)

## 2013-05-01 LAB — PSA, TOTAL AND FREE
PSA, Free Pct: 32 %
PSA, Free: 0.16 ng/mL
PSA: 0.5 ng/mL (ref 0.0–4.0)

## 2013-05-01 LAB — LIPID PANEL
Chol/HDL Ratio: 7.3 ratio units — ABNORMAL HIGH (ref 0.0–5.0)
Cholesterol, Total: 160 mg/dL (ref 100–199)
HDL: 22 mg/dL — ABNORMAL LOW (ref >39)
LDL Calculated: 87 mg/dL (ref 0–99)
Triglycerides: 256 mg/dL — ABNORMAL HIGH (ref 0–149)
VLDL Cholesterol Cal: 51 mg/dL — ABNORMAL HIGH (ref 5–40)

## 2013-05-01 LAB — VITAMIN B12: Vitamin B-12: 411 pg/mL (ref 211–946)

## 2013-05-01 MED ORDER — VITAMIN D (ERGOCALCIFEROL) 1.25 MG (50000 UNIT) PO CAPS: 50000.0000 [IU] | ORAL_CAPSULE | ORAL | Status: DC

## 2013-05-06 ENCOUNTER — Telehealth: Payer: Self-pay | Admitting: Family Medicine

## 2013-05-06 ENCOUNTER — Inpatient Hospital Stay (HOSPITAL_COMMUNITY)
Admission: EM | Admit: 2013-05-06 | Discharge: 2013-05-08 | DRG: 176 | Disposition: A | Discharge status: Home / Self Care | Payer: Medicare Other | Attending: Internal Medicine | Admitting: Internal Medicine

## 2013-05-06 ENCOUNTER — Encounter (HOSPITAL_COMMUNITY): Payer: Self-pay | Admitting: Emergency Medicine

## 2013-05-06 ENCOUNTER — Ambulatory Visit (HOSPITAL_COMMUNITY)
Admission: RE | Admit: 2013-05-06 | Discharge: 2013-05-06 | Disposition: A | Discharge status: Home / Self Care | Payer: Medicare Other | Source: Ambulatory Visit | Attending: Family Medicine | Admitting: Family Medicine

## 2013-05-06 DIAGNOSIS — Z79899 Other long term (current) drug therapy: Secondary | ICD-10-CM | POA: Diagnosis not present

## 2013-05-06 DIAGNOSIS — E785 Hyperlipidemia, unspecified: Secondary | ICD-10-CM | POA: Diagnosis present

## 2013-05-06 DIAGNOSIS — F329 Major depressive disorder, single episode, unspecified: Secondary | ICD-10-CM | POA: Diagnosis present

## 2013-05-06 DIAGNOSIS — Z720 Tobacco use: Secondary | ICD-10-CM

## 2013-05-06 DIAGNOSIS — Z8249 Family history of ischemic heart disease and other diseases of the circulatory system: Secondary | ICD-10-CM

## 2013-05-06 DIAGNOSIS — R918 Other nonspecific abnormal finding of lung field: Secondary | ICD-10-CM

## 2013-05-06 DIAGNOSIS — I2699 Other pulmonary embolism without acute cor pulmonale: Principal | ICD-10-CM | POA: Diagnosis present

## 2013-05-06 DIAGNOSIS — F411 Generalized anxiety disorder: Secondary | ICD-10-CM | POA: Diagnosis present

## 2013-05-06 DIAGNOSIS — J209 Acute bronchitis, unspecified: Secondary | ICD-10-CM | POA: Diagnosis not present

## 2013-05-06 DIAGNOSIS — F250 Schizoaffective disorder, bipolar type: Secondary | ICD-10-CM | POA: Diagnosis present

## 2013-05-06 DIAGNOSIS — J90 Pleural effusion, not elsewhere classified: Secondary | ICD-10-CM | POA: Diagnosis present

## 2013-05-06 DIAGNOSIS — F259 Schizoaffective disorder, unspecified: Secondary | ICD-10-CM | POA: Diagnosis present

## 2013-05-06 DIAGNOSIS — Z832 Family history of diseases of the blood and blood-forming organs and certain disorders involving the immune mechanism: Secondary | ICD-10-CM | POA: Diagnosis not present

## 2013-05-06 DIAGNOSIS — F17200 Nicotine dependence, unspecified, uncomplicated: Secondary | ICD-10-CM | POA: Diagnosis not present

## 2013-05-06 DIAGNOSIS — F32A Depression, unspecified: Secondary | ICD-10-CM | POA: Diagnosis present

## 2013-05-06 DIAGNOSIS — E876 Hypokalemia: Secondary | ICD-10-CM | POA: Diagnosis present

## 2013-05-06 DIAGNOSIS — Z823 Family history of stroke: Secondary | ICD-10-CM | POA: Diagnosis not present

## 2013-05-06 DIAGNOSIS — Z86711 Personal history of pulmonary embolism: Secondary | ICD-10-CM | POA: Diagnosis not present

## 2013-05-06 DIAGNOSIS — R0602 Shortness of breath: Secondary | ICD-10-CM | POA: Diagnosis not present

## 2013-05-06 HISTORY — DX: Pleural effusion, not elsewhere classified: J90

## 2013-05-06 HISTORY — DX: Solitary pulmonary nodule: R91.1

## 2013-05-06 HISTORY — DX: Other pulmonary embolism without acute cor pulmonale: I26.99

## 2013-05-06 LAB — COMPREHENSIVE METABOLIC PANEL
ALBUMIN: 3.4 g/dL — AB (ref 3.5–5.2)
ALK PHOS: 58 U/L (ref 39–117)
ALT: 26 U/L (ref 0–53)
AST: 32 U/L (ref 0–37)
BUN: 7 mg/dL (ref 6–23)
CO2: 25 mEq/L (ref 19–32)
Calcium: 8.8 mg/dL (ref 8.4–10.5)
Chloride: 102 mEq/L (ref 96–112)
Creatinine, Ser: 0.9 mg/dL (ref 0.50–1.35)
GFR calc Af Amer: >90 mL/min (ref >90)
GFR calc non Af Amer: >90 mL/min (ref >90)
Glucose, Bld: 104 mg/dL — ABNORMAL HIGH (ref 70–99)
POTASSIUM: 3.5 meq/L — AB (ref 3.7–5.3)
SODIUM: 139 meq/L (ref 137–147)
TOTAL PROTEIN: 6.5 g/dL (ref 6.0–8.3)
Total Bilirubin: 0.2 mg/dL — ABNORMAL LOW (ref 0.3–1.2)

## 2013-05-06 LAB — CBC WITH DIFFERENTIAL/PLATELET
BASOS PCT: 0 % (ref 0–1)
Basophils Absolute: 0 10*3/uL (ref 0.0–0.1)
EOS ABS: 0.1 10*3/uL (ref 0.0–0.7)
Eosinophils Relative: 1 % (ref 0–5)
HCT: 41.4 % (ref 39.0–52.0)
Hemoglobin: 14.3 g/dL (ref 13.0–17.0)
Lymphocytes Relative: 15 % (ref 12–46)
Lymphs Abs: 1.2 10*3/uL (ref 0.7–4.0)
MCH: 33.6 pg (ref 26.0–34.0)
MCHC: 34.5 g/dL (ref 30.0–36.0)
MCV: 97.2 fL (ref 78.0–100.0)
Monocytes Absolute: 0.6 10*3/uL (ref 0.1–1.0)
Monocytes Relative: 7 % (ref 3–12)
NEUTROS ABS: 6.1 10*3/uL (ref 1.7–7.7)
NEUTROS PCT: 77 % (ref 43–77)
PLATELETS: 229 10*3/uL (ref 150–400)
RBC: 4.26 MIL/uL (ref 4.22–5.81)
RDW: 13.5 % (ref 11.5–15.5)
WBC: 8 10*3/uL (ref 4.0–10.5)

## 2013-05-06 LAB — LIPID PANEL
CHOL/HDL RATIO: 5.6 ratio
Cholesterol: 100 mg/dL (ref 0–200)
HDL: 18 mg/dL — AB (ref >39)
LDL Cholesterol: 49 mg/dL (ref 0–99)
Triglycerides: 167 mg/dL — ABNORMAL HIGH (ref <150)
VLDL: 33 mg/dL (ref 0–40)

## 2013-05-06 LAB — PROTIME-INR
INR: 0.99 (ref 0.00–1.49)
Prothrombin Time: 12.9 seconds (ref 11.6–15.2)

## 2013-05-06 MED ORDER — ONDANSETRON HCL 4 MG/2ML IJ SOLN
4.0000 mg | Freq: Four times a day (QID) | INTRAMUSCULAR | Status: DC | PRN
Start: 2013-05-06 — End: 2013-05-08

## 2013-05-06 MED ORDER — WARFARIN - PHARMACIST DOSING INPATIENT
Status: DC
  Administered 2013-05-07: 16:00:00

## 2013-05-06 MED ORDER — ALUM & MAG HYDROXIDE-SIMETH 200-200-20 MG/5ML PO SUSP: 30.0000 mL | Freq: Four times a day (QID) | ORAL | Status: DC | PRN

## 2013-05-06 MED ORDER — HYDROCODONE-ACETAMINOPHEN 5-325 MG PO TABS
1.0000 | ORAL_TABLET | ORAL | Status: DC | PRN
  Administered 2013-05-07: 2 via ORAL
  Filled 2013-05-06: qty 2

## 2013-05-06 MED ORDER — WARFARIN SODIUM 10 MG PO TABS
10.0000 mg | ORAL_TABLET | Freq: Once | ORAL | Status: AC
  Administered 2013-05-06: 10 mg via ORAL
  Filled 2013-05-06: qty 1

## 2013-05-06 MED ORDER — POTASSIUM CHLORIDE CRYS ER 20 MEQ PO TBCR
40.0000 meq | EXTENDED_RELEASE_TABLET | Freq: Once | ORAL | Status: AC
  Administered 2013-05-06: 40 meq via ORAL
  Filled 2013-05-06: qty 2

## 2013-05-06 MED ORDER — BENZONATATE 100 MG PO CAPS
100.0000 mg | ORAL_CAPSULE | Freq: Two times a day (BID) | ORAL | Status: DC
  Administered 2013-05-06 – 2013-05-08 (×4): 100 mg via ORAL
  Filled 2013-05-06 (×4): qty 1

## 2013-05-06 MED ORDER — PREDNISONE 20 MG PO TABS
60.0000 mg | ORAL_TABLET | Freq: Every day | ORAL | Status: DC
  Administered 2013-05-07: 60 mg via ORAL
  Filled 2013-05-06: qty 3

## 2013-05-06 MED ORDER — PATIENT'S GUIDE TO USING COUMADIN BOOK
Freq: Once | Status: AC
  Administered 2013-05-06: 17:00:00
  Filled 2013-05-06: qty 1

## 2013-05-06 MED ORDER — ENOXAPARIN SODIUM 100 MG/ML ~~LOC~~ SOLN
1.0000 mg/kg | Freq: Once | SUBCUTANEOUS | Status: AC
  Administered 2013-05-06: 100 mg via SUBCUTANEOUS
  Filled 2013-05-06: qty 1

## 2013-05-06 MED ORDER — LEVOFLOXACIN 500 MG PO TABS
500.0000 mg | ORAL_TABLET | Freq: Every day | ORAL | Status: DC
  Administered 2013-05-06 – 2013-05-08 (×3): 500 mg via ORAL
  Filled 2013-05-06 (×3): qty 1

## 2013-05-06 MED ORDER — CLONAZEPAM 0.5 MG PO TABS
1.0000 mg | ORAL_TABLET | Freq: Three times a day (TID) | ORAL | Status: DC
  Administered 2013-05-06 – 2013-05-08 (×6): 1 mg via ORAL
  Filled 2013-05-06 (×6): qty 2

## 2013-05-06 MED ORDER — ONDANSETRON HCL 4 MG PO TABS: 4.0000 mg | ORAL_TABLET | Freq: Four times a day (QID) | ORAL | Status: DC | PRN

## 2013-05-06 MED ORDER — ENOXAPARIN SODIUM 100 MG/ML ~~LOC~~ SOLN
1.0000 mg/kg | Freq: Two times a day (BID) | SUBCUTANEOUS | Status: DC
  Administered 2013-05-06 – 2013-05-07 (×3): 100 mg via SUBCUTANEOUS
  Filled 2013-05-06 (×3): qty 1

## 2013-05-06 MED ORDER — EZETIMIBE 10 MG PO TABS
10.0000 mg | ORAL_TABLET | Freq: Every day | ORAL | Status: DC
  Administered 2013-05-07 – 2013-05-08 (×2): 10 mg via ORAL
  Filled 2013-05-06 (×2): qty 1

## 2013-05-06 MED ORDER — SODIUM CHLORIDE 0.9 % IJ SOLN
3.0000 mL | Freq: Two times a day (BID) | INTRAMUSCULAR | Status: DC
  Administered 2013-05-06 – 2013-05-07 (×3): 3 mL via INTRAVENOUS

## 2013-05-06 MED ORDER — DOCUSATE SODIUM 100 MG PO CAPS
100.0000 mg | ORAL_CAPSULE | Freq: Two times a day (BID) | ORAL | Status: DC
  Administered 2013-05-06 – 2013-05-08 (×5): 100 mg via ORAL
  Filled 2013-05-06 (×5): qty 1

## 2013-05-06 MED ORDER — IPRATROPIUM-ALBUTEROL 0.5-2.5 (3) MG/3ML IN SOLN
3.0000 mL | Freq: Four times a day (QID) | RESPIRATORY_TRACT | Status: DC
  Administered 2013-05-07 – 2013-05-08 (×6): 3 mL via RESPIRATORY_TRACT
  Filled 2013-05-06 (×8): qty 3

## 2013-05-06 MED ORDER — WARFARIN VIDEO
Freq: Once | Status: AC
  Administered 2013-05-06: 17:00:00

## 2013-05-06 MED ORDER — ATORVASTATIN CALCIUM 10 MG PO TABS: 10.0000 mg | ORAL_TABLET | Freq: Every day | ORAL | Status: DC

## 2013-05-06 MED ORDER — SIMVASTATIN 10 MG PO TABS
5.0000 mg | ORAL_TABLET | Freq: Every day | ORAL | Status: DC
  Administered 2013-05-06 – 2013-05-07 (×2): 5 mg via ORAL
  Filled 2013-05-06 (×3): qty 1

## 2013-05-06 MED ORDER — IOHEXOL 300 MG/ML  SOLN
80.0000 mL | Freq: Once | INTRAMUSCULAR | Status: AC | PRN
  Administered 2013-05-06: 80 mL via INTRAVENOUS

## 2013-05-06 MED ORDER — RISPERIDONE 1 MG PO TABS
4.0000 mg | ORAL_TABLET | Freq: Two times a day (BID) | ORAL | Status: DC
  Administered 2013-05-06 – 2013-05-08 (×4): 4 mg via ORAL
  Filled 2013-05-06 (×4): qty 4

## 2013-05-06 MED ORDER — FENOFIBRATE 54 MG PO TABS
54.0000 mg | ORAL_TABLET | Freq: Every day | ORAL | Status: DC
Start: 2013-05-07 — End: 2013-05-08
  Administered 2013-05-07: 54 mg via ORAL
  Filled 2013-05-06 (×4): qty 1

## 2013-05-06 MED ORDER — SODIUM CHLORIDE 0.9 % IV SOLN
INTRAVENOUS | Status: AC
  Administered 2013-05-06: 17:00:00 via INTRAVENOUS

## 2013-05-06 MED ORDER — NICOTINE 21 MG/24HR TD PT24
21.0000 mg | MEDICATED_PATCH | Freq: Every day | TRANSDERMAL | Status: DC
  Administered 2013-05-06 – 2013-05-08 (×3): 21 mg via TRANSDERMAL
  Filled 2013-05-06 (×3): qty 1

## 2013-05-06 MED ORDER — ACETAMINOPHEN 650 MG RE SUPP: 650.0000 mg | Freq: Four times a day (QID) | RECTAL | Status: DC | PRN

## 2013-05-06 MED ORDER — ACETAMINOPHEN 325 MG PO TABS: 650.0000 mg | ORAL_TABLET | Freq: Four times a day (QID) | ORAL | Status: DC | PRN

## 2013-05-06 MED ORDER — SERTRALINE HCL 50 MG PO TABS
100.0000 mg | ORAL_TABLET | Freq: Two times a day (BID) | ORAL | Status: DC
  Administered 2013-05-06 – 2013-05-08 (×4): 100 mg via ORAL
  Filled 2013-05-06 (×4): qty 2

## 2013-05-06 NOTE — Progress Notes (Signed)
UR chart review completed.  

## 2013-05-06 NOTE — ED Provider Notes (Signed)
CSN: [id number]     Arrival date & time 05/06/13  1206 History  This chart was scribed for Ian Diego, MD,  by Stacy Gardner, ED Scribe. The patient was seen in room APA05/APA05 and the patient's care was started at 12:12 PM.   First MD Initiated Contact with Patient 05/06/13 1209     Chief Complaint  Patient presents with  . Shortness of Breath     (Consider location/radiation/quality/duration/timing/severity/associated sxs/prior Treatment) Patient is a 43 y.o. male presenting with shortness of breath. The history is provided by the patient and medical records (the pt has sob). No language interpreter was used.  Shortness of Breath Severity:  Moderate Onset quality:  Gradual Timing:  Constant Progression:  Unchanged Chronicity:  New Context: activity   Relieved by:  Nothing Associated symptoms: cough and fever   Associated symptoms: no abdominal pain, no chest pain, no headaches and no rash   Risk factors: no tobacco use    HPI Comments: Ian Grimes is a 44 y.o. male who presents to the Emergency Department after being seen today for SOB. Pt has x-ray findings that indicates the presence of pulmonary embolism. Pt mentions having the associated symptom of weakness, nausea, fever, chills, productive cough ( white sputum), bilateral leg pain ( "at the bones") for the past week. He mentions that he feels "terrible" and nothing seems to make his symptoms improve. Pt currently smokes. Deneis pain to the dorsal aspect of his legs.  Pt's PCP is Dr Redge Gainer Past Medical History  Diagnosis Date  . Schizo-affective psychosis   . Anxiety   . Depression   . HA (headache)   . Hyperlipidemia    Past Surgical History  Procedure Laterality Date  . Rib injury     Family History  Problem Relation Age of Onset  . Heart disease Father   . Stroke Father    History  Substance Use Topics  . Smoking status: Current Every Day Smoker  . Smokeless tobacco: Never Used  . Alcohol  Use: 0.6 oz/week    1 Cans of beer per week    Review of Systems  Constitutional: Positive for fever and chills. Negative for appetite change and fatigue.  HENT: Negative for congestion, ear discharge and sinus pressure.   Eyes: Negative for discharge.  Respiratory: Positive for cough and shortness of breath.   Cardiovascular: Negative for chest pain.  Gastrointestinal: Positive for nausea. Negative for abdominal pain and diarrhea.  Genitourinary: Negative for frequency and hematuria.  Musculoskeletal: Positive for arthralgias. Negative for back pain.  Skin: Negative for rash.  Neurological: Positive for weakness. Negative for seizures and headaches.  Psychiatric/Behavioral: Negative for hallucinations.      Allergies  Bactrim  Home Medications   Current Outpatient Rx  Name  Route  Sig  Dispense  Refill  . amoxicillin (AMOXIL) 875 MG tablet   Oral   Take 1 tablet (875 mg total) by mouth 2 (two) times daily.   20 tablet   0   . clonazePAM (KLONOPIN) 1 MG tablet   Oral   Take 1 mg by mouth 2 (two) times daily.          Marland Kitchen ezetimibe (ZETIA) 10 MG tablet   Oral   Take 1 tablet (10 mg total) by mouth daily.   90 tablet   3   . fenofibrate micronized (ANTARA) 130 MG capsule   Oral   Take 1 capsule (130 mg total) by mouth daily before breakfast.  30 capsule   5   . ibuprofen (ADVIL,MOTRIN) 200 MG tablet   Oral   Take 800 mg by mouth 2 (two) times daily as needed. For pain         . pravastatin (PRAVACHOL) 40 MG tablet   Oral   Take 1 tablet (40 mg total) by mouth daily.   90 tablet   3   . predniSONE (DELTASONE) 20 MG tablet   Oral   Take 1 tablet (20 mg total) by mouth daily with breakfast.   4 tablet   0   . risperidone (RISPERDAL) 4 MG tablet   Oral   Take 4 mg by mouth 2 (two) times daily.          . rosuvastatin (CRESTOR) 10 MG tablet   Oral   Take 1 tablet (10 mg total) by mouth daily.   90 tablet   1   . EXPIRED: sertraline (ZOLOFT) 100  MG tablet   Oral   Take 100 mg by mouth 2 (two) times daily.          . Vitamin D, Ergocalciferol, (DRISDOL) 50000 UNITS CAPS capsule   Oral   Take 1 capsule (50,000 Units total) by mouth every 7 (seven) days.   18 capsule   0    BP 120/85  Temp(Src) 98.7 F (37.1 C) (Oral)  Ht 6' (1.829 m)  Wt 219 lb (99.338 kg)  BMI 29.70 kg/m2  SpO2 96% Physical Exam  Nursing note and vitals reviewed. Constitutional: He is oriented to person, place, and time. He appears well-developed and well-nourished. No distress.  HENT:  Head: Normocephalic and atraumatic.  Eyes: EOM are normal. Pupils are equal, round, and reactive to light.  Neck: Normal range of motion. Neck supple. No tracheal deviation present.  Cardiovascular: Normal rate, regular rhythm and normal heart sounds.  Exam reveals no gallop and no friction rub.   No murmur heard. Pulmonary/Chest: Effort normal. No respiratory distress. He has wheezes (mild wheezing bilaterally). He has no rales. He exhibits no tenderness.  Abdominal: Soft. He exhibits no distension. There is no rebound and no guarding.  Musculoskeletal: Normal range of motion.  Neurological: He is alert and oriented to person, place, and time.  Skin: Skin is warm and dry.  Psychiatric: He has a normal mood and affect. His behavior is normal.    ED Course  Procedures (including critical care time) DIAGNOSTIC STUDIES: Oxygen Saturation is 96% on room air, normal by my interpretation.    COORDINATION OF CARE:  12:14 PM Discussed course of care with pt . Pt understands and agrees.   Labs Review Labs Reviewed - No data to display Imaging Review Ct Chest W Contrast  05/06/2013   CLINICAL DATA:  Chest pain, cough, right lower lobe nodules on chest radiograph  EXAM: CT CHEST WITH CONTRAST  TECHNIQUE: Multidetector CT imaging of the chest was performed during intravenous contrast administration.  CONTRAST:  4mL OMNIPAQUE IOHEXOL 300 MG/ML  SOLN  COMPARISON:  Chest  radiographs dated 04/30/2013  FINDINGS: Study was not performed as a CTA for evaluation of the pulmonary arteries. However, there are segmental pulmonary emboli within all lobes. Overall clot burden is moderate. No findings to suggest right heart strain. RV-to-LV ratio 0.85.  8 x 6 mm triangular subpleural nodular opacity in the lateral right middle lobe, corresponding to the radiographic abnormality, favored to reflect a subpleural lymph node but technically indeterminate.  Faint ground-glass nodular opacities in the left upper lobe/ lingula (series  3/ images 27, 29, 31, and 34).  Mild paraseptal emphysematous changes. Trace right pleural effusion. No pneumothorax.  Heart is normal in size. No pericardial effusion. Ectasia of the ascending thoracic aorta measuring 4.0 cm (series 2/image 25).  Small mediastinal lymph nodes, including a mildly prominent subcarinal node measuring 11 mm short axis (series 2/image 29).  Visualized upper abdomen is unremarkable.  Mild degenerative changes of the visualized thoracolumbar spine.  IMPRESSION: Segmental pulmonary emboli within all lobes. Overall clot burden is moderate.  8 x 6 mm triangular subpleural nodule in the lateral right middle lobe, corresponding to the radiographic abnormality, favored to reflect a benign subpleural lymph node but technically indeterminate. Given patient's high risk for primary bronchogenic neoplasm, initial follow-up CT chest is suggested in 6 months.  Faint ground-glass nodular opacities in the left upper lobe/lingula, possibly infectious/inflammatory.  Trace right pleural effusion.  Nodule recommendation follows the consensus statement: Guidelines for Management of Small Pulmonary Nodules Detected on CT Scans: A Statement from the [place] as published in Radiology 2005; 237:395-400.  Critical value/emergent results were called by telephone at the time of interpretation on 05/06/2013 at 9:12 AM to [facility], who verbally  acknowledged these results.   Electronically Signed   By: Julian Hy M.D.   On: 05/06/2013 09:35     EKG Interpretation None      MDM   Final diagnoses:  None    The chart was scribed for me under my direct supervision.  I personally performed the history, physical, and medical decision making and all procedures in the evaluation of this patient.Ian Diego, MD 05/06/13 [phone]

## 2013-05-06 NOTE — Progress Notes (Signed)
ANTICOAGULATION CONSULT NOTE - Initial Consult  Pharmacy Consult for Lovenox --> Coumadin Indication: pulmonary embolus  Allergies  Allergen Reactions  . Bactrim Rash    Patient Measurements: Height: 6' (182.9 cm) Weight: 219 lb (99.338 kg) IBW/kg (Calculated) : 77.6  Vital Signs: Temp: 97.8 F (36.6 C) (03/09 1448) Temp src: Oral (03/09 1448) BP: 121/84 mmHg (03/09 1448) Pulse Rate: 107 (03/09 1448)  Labs:  Recent Labs  05/06/13 1223  HGB 14.3  HCT 41.4  PLT 229  LABPROT 12.9  INR 0.99  CREATININE 0.90    Estimated Creatinine Clearance: 130.5 ml/min (by C-G formula based on Cr of 0.9).   Medical History: Past Medical History  Diagnosis Date  . Schizo-affective psychosis   . Anxiety   . Depression   . HA (headache)   . Hyperlipidemia   . Pulmonary emboli   . Pulmonary nodule     follow up CT 6 months (due 9/15)  . Pleural effusion     Medications:  Scheduled:  . clonazePAM  1 mg Oral TID  . docusate sodium  100 mg Oral BID  . enoxaparin (LOVENOX) injection  1 mg/kg Subcutaneous Q12H  . [START ON 05/07/2013] ezetimibe  10 mg Oral Daily  . [START ON 05/07/2013] fenofibrate  54 mg Oral Daily  . potassium chloride  40 mEq Oral Once  . risperidone  4 mg Oral BID  . sertraline  100 mg Oral BID  . simvastatin  5 mg Oral q1800  . sodium chloride  3 mL Intravenous Q12H    Assessment: 43 yo M with newly diagnosed PE to start on Lovenox--> Coumadin bridge.  Overlap day#1/5. Renal function is at patient's baseline.  No bleeding noted.   Goal of Therapy:  INR 2-3 Anti-Xa level 0.6-1 units/ml 4hrs after LMWH dose given Monitor platelets by anticoagulation protocol: Yes   Plan:  Coumadin 10mg  po x1 today Lovenox 100mg  sq q12h for minimum 5 days & until INR>2 x 24hrs Daily INR Monitor CBC Initiate warfarin education  Biagio Borg 05/06/2013,3:46 PM

## 2013-05-06 NOTE — H&P (Signed)
[facility] History and Physical  Ian Grimes [id number] DOB: [birth year] DOA: 05/06/2013  Referring physician: zammit PCP: Redge Gainer, MD   Chief Complaint: abnormal CT chest/ persistent sob  HPI: Ian Grimes is a 43 y.o. male  With a past medical history that includes itself active disorder, anxiety, hyperlipidemia presents to the emergency department with chief complaint of abnormal chest CT. Chest CT done earlier today yielded pulmonary emboli and he was referred for admission.  She reports that he has experienced gradual worsening shortness of breath over the last 2-3 weeks. He saw his primary care provider on March 3 at which time he had a chest x-ray and was given amoxicillin which he is still taking and prednisone. He states that he has not improved. At that time it was recommended that he have a outpatient CT anterior of the chest to followup on pulmonary nodule noted on chest x-ray. He had that CT this morning. He reports his primary care provider called him and told him to come to the emergency room is that CT of the chest revealed pulmonary emboli. He denies chest pain palpitations. He denies any history of DVT or PE. He denies any familial history of DVT or PE. He denies lower extremity edema but does report some bilateral lower extremity tenderness. Associated symptoms include cough with white thick sputum, nausea without vomiting, decreased by mouth intake, 2 episodes of loose stool. He reports that he is quite sedentary and has been so for the last 8 months. He reports that he spends his day lying on the couch. In addition he is a heavy smoker.  Initial workup in the emergency department yields a basic metabolic panel with a potassium of 3.5 and a serum glucose was 104. Vital signs are stable with a heart rate range of 94-107 and respiratory rate of 22-32 . Oxygen saturation levels were 94-96% on 2 L nasal cannula. EKG with normal sinus rhythm. He was given normal sinus  rhythm in the emergency department.    Review of Systems:  10 point review of systems complete and all systems are negative except as indicated in the history of present illness   Past Medical History  Diagnosis Date  . Schizo-affective psychosis   . Anxiety   . Depression   . HA (headache)   . Hyperlipidemia   . Pulmonary emboli   . Pulmonary nodule     follow up CT 6 months (due 9/15)  . Pleural effusion    Past Surgical History  Procedure Laterality Date  . Rib injury     Social History:  reports that he has been smoking.  He has never used smokeless tobacco. He reports that he drinks about 0.6 ounces of alcohol per week. He reports that he does not use illicit drugs. He smokes about 2 packs of cigarettes a day. He is on disability and is quite sedentary. He lives alone. He is independent with ADLs Allergies  Allergen Reactions  . Bactrim Rash    Family History  Problem Relation Age of Onset  . Heart disease Father   . Stroke Father      Prior to Admission medications   Medication Sig Start Date End Date Taking? Authorizing Provider  amoxicillin (AMOXIL) 875 MG tablet Take 1 tablet (875 mg total) by mouth 2 (two) times daily. 04/30/13  Yes Lysbeth Penner, FNP  clonazePAM (KLONOPIN) 1 MG tablet Take 1 mg by mouth 3 (three) times daily.  01/17/13  Yes Historical Provider,  MD  ezetimibe (ZETIA) 10 MG tablet Take 1 tablet (10 mg total) by mouth daily. 07/24/12  Yes Mary-Margaret Hassell Done, FNP  fenofibrate micronized (ANTARA) 130 MG capsule Take 1 capsule (130 mg total) by mouth daily before breakfast. 01/18/13  Yes Mary-Margaret Hassell Done, FNP  ibuprofen (ADVIL,MOTRIN) 200 MG tablet Take 800 mg by mouth 2 (two) times daily as needed. For pain   Yes Historical Provider, MD  pravastatin (PRAVACHOL) 40 MG tablet Take 1 tablet (40 mg total) by mouth daily. 04/30/13  Yes Lysbeth Penner, FNP  risperidone (RISPERDAL) 4 MG tablet Take 4 mg by mouth 2 (two) times daily.  01/17/13  Yes  Historical Provider, MD  rosuvastatin (CRESTOR) 10 MG tablet Take 1 tablet (10 mg total) by mouth daily. 01/28/13  Yes Mary-Margaret Hassell Done, FNP  sertraline (ZOLOFT) 100 MG tablet Take 100 mg by mouth 2 (two) times daily.  11/03/11 05/06/13 Yes Evalee Jefferson, PA-C  predniSONE (DELTASONE) 20 MG tablet Take 1 tablet (20 mg total) by mouth daily with breakfast. 04/30/13   Lysbeth Penner, FNP   Physical Exam: Filed Vitals:   05/06/13 1448  BP: 121/84  Pulse: 107  Temp: 97.8 F (36.6 C)  Resp: 22    BP 121/84  Pulse 107  Temp(Src) 97.8 F (36.6 C) (Oral)  Resp 22  Ht 6' (1.829 m)  Wt 99.338 kg (219 lb)  BMI 29.70 kg/m2  SpO2 96%  General:  Appears calm and comfortable Eyes: PERRL, normal lids, irises & conjunctiva ENT: Ears are clear nose without drainage oropharynx without erythema or exudate. Mucous membranes pink slightly dry. Very poor dentition Neck: no LAD, masses or thyromegaly Cardiovascular: RRR, no m/r/g. No LE edema. Nontender to palpation Respiratory: Normal effort. Breath sounds are somewhat diminished throughout with faint expiratory wheeze bilaterally. I hear no crackles no rhonchi Abdomen: soft, ntnd Skin: no rash or induration seen on limited exam Musculoskeletal: grossly normal tone BUE/BLE Psychiatric: grossly normal mood and affect, speech fluent and appropriate Neurologic: grossly non-focal.          Labs on Admission:  Basic Metabolic Panel:  Recent Labs Lab 04/30/13 1029 05/06/13 1223  NA 141 139  K 4.7 3.5*  CL 100 102  CO2 20 25  GLUCOSE 88 104*  BUN 10 7  CREATININE 1.05 0.90  CALCIUM 9.8 8.8   Liver Function Tests:  Recent Labs Lab 04/30/13 1029 05/06/13 1223  AST 37 32  ALT 24 26  ALKPHOS 70 58  BILITOT 0.3 0.2*  PROT 7.0 6.5  ALBUMIN  --  3.4*   No results found for this basename: LIPASE, AMYLASE,  in the last 168 hours No results found for this basename: AMMONIA,  in the last 168 hours CBC:  Recent Labs Lab 04/30/13 1043  05/06/13 1223  WBC 14.9* 8.0  NEUTROABS  --  6.1  HGB 16.2 14.3  HCT 49.8 41.4  MCV 98.9* 97.2  PLT  --  229   Cardiac Enzymes: No results found for this basename: CKTOTAL, CKMB, CKMBINDEX, TROPONINI,  in the last 168 hours  BNP (last 3 results) No results found for this basename: PROBNP,  in the last 8760 hours CBG: No results found for this basename: GLUCAP,  in the last 168 hours  Radiological Exams on Admission: Ct Chest W Contrast  05/06/2013   CLINICAL DATA:  Chest pain, cough, right lower lobe nodules on chest radiograph  EXAM: CT CHEST WITH CONTRAST  TECHNIQUE: Multidetector CT imaging of the chest was performed  during intravenous contrast administration.  CONTRAST:  22mL OMNIPAQUE IOHEXOL 300 MG/ML  SOLN  COMPARISON:  Chest radiographs dated 04/30/2013  FINDINGS: Study was not performed as a CTA for evaluation of the pulmonary arteries. However, there are segmental pulmonary emboli within all lobes. Overall clot burden is moderate. No findings to suggest right heart strain. RV-to-LV ratio 0.85.  8 x 6 mm triangular subpleural nodular opacity in the lateral right middle lobe, corresponding to the radiographic abnormality, favored to reflect a subpleural lymph node but technically indeterminate.  Faint ground-glass nodular opacities in the left upper lobe/ lingula (series 3/ images 27, 29, 31, and 34).  Mild paraseptal emphysematous changes. Trace right pleural effusion. No pneumothorax.  Heart is normal in size. No pericardial effusion. Ectasia of the ascending thoracic aorta measuring 4.0 cm (series 2/image 25).  Small mediastinal lymph nodes, including a mildly prominent subcarinal node measuring 11 mm short axis (series 2/image 29).  Visualized upper abdomen is unremarkable.  Mild degenerative changes of the visualized thoracolumbar spine.  IMPRESSION: Segmental pulmonary emboli within all lobes. Overall clot burden is moderate.  8 x 6 mm triangular subpleural nodule in the lateral right  middle lobe, corresponding to the radiographic abnormality, favored to reflect a benign subpleural lymph node but technically indeterminate. Given patient's high risk for primary bronchogenic neoplasm, initial follow-up CT chest is suggested in 6 months.  Faint ground-glass nodular opacities in the left upper lobe/lingula, possibly infectious/inflammatory.  Trace right pleural effusion.  Nodule recommendation follows the consensus statement: Guidelines for Management of Small Pulmonary Nodules Detected on CT Scans: A Statement from the [place] as published in Radiology 2005; 237:395-400.  Critical value/emergent results were called by telephone at the time of interpretation on 05/06/2013 at 9:12 AM to [facility], who verbally acknowledged these results.   Electronically Signed   By: Julian Hy M.D.   On: 05/06/2013 09:35    EKG: Independently reviewed normal sinus rhythm  Assessment/Plan Principal Problem:   Pulmonary emboli: Incidental finding during outpatient workup for worsening shortness of breath. Diagnosed with bronchitis last week and chest x-ray yielded pulmonary nodules so we had a followup CT of the chest this morning which yielded segmental pulmonary emboli within all  lobes. Overall clot burden is moderate. No findings to suggest heart strain. Will admit to telemetry. Will request Lovenox and Coumadin per pharmacy. Will provide oxygen supplementation as needed. Will monitor his fat rates and his INR until therapeutic.  Active Problems: Hypokalemia: Mild. Likely do to decreased by mouth intake in setting of acute illness. Will replete and recheck.    Pulmonary nodules: Incidental finding on chest x-ray last week. CT today yields subpleural nodule right middle lobe favored to reflect a benign subpleural lymph node but technically indeterminate. Faint groundglass nodular opacities in left upper lobe possibly infectious/inflammatory. Recommend followup CT in 6 months.      Pleural effusion: Trace on right noted in CT.    Depression: Appears stable at baseline continue his home medication    Schizoaffective disorder: Appears stable at baseline continue his home medications    Hyperlipidemia: Will obtain lipid panel. Continue his home medications  Tobacco abuse: Counseled regarding cessation.      Code Status: full Family Communication: none present Disposition Plan: home when ready  Time spent: 33 minutes  [facility] Pager [phone]

## 2013-05-06 NOTE — ED Notes (Signed)
Pt seen in Ed today. Xrays obtained. Sent home. Xrays called to his doc which showed PE. Pt's doc had pt come back to [facility].

## 2013-05-06 NOTE — H&P (Signed)
The patient was seen and examined. His chart, vitals signs, and laboratory studies were reviewed. He was discussed with nurse practitioner Ms. Black. Agree with her findings with additions below. This is a 43 year old man who was diagnosed with pulmonary emboli with an outpatient workup of persistent shortness of breath and bronchitis. CT scan revealed multiple pulmonary emboli.  On exam, the patient has mild tachypnea at rest and scattered bronchospasms. He is tachycardic per auscultation with S1, S2, with no other murmurs rubs or gallops.  1. Segmental pulmonary emboli in all lobes. Continue Lovenox and warfarin started. 2. Positive family history of DVT in father. We'll order a hypercoagulable panel. Also will assess his thyroid function. 3. Pulmonary nodules. He will need a followup CT of the chest in 6 months. 4. Acute bronchitis. Incomplete treatment with amoxicillin, albuterol inhaler, and prednisone in the outpatient setting. Will restart prednisone and taper slowly; start Levaquin; and start albuterol/Atrovent nebulizers. Continue oxygen titrated to comfort. Start [employer] for cough. Titrate all medications as needed depending on symptomatology. 5. Tobacco abuse. The patient was strongly advised to stop smoking. Start a nicotine patch. Order tobacco cessation counseling. 6. Schizoaffective disorder. Stable. Continue chronic medications.

## 2013-05-06 NOTE — Telephone Encounter (Signed)
Pt notified of lab results

## 2013-05-07 ENCOUNTER — Ambulatory Visit: Payer: Medicare Other | Admitting: Family Medicine

## 2013-05-07 DIAGNOSIS — R918 Other nonspecific abnormal finding of lung field: Secondary | ICD-10-CM

## 2013-05-07 LAB — COMPREHENSIVE METABOLIC PANEL
ALBUMIN: 3.3 g/dL — AB (ref 3.5–5.2)
ALT: 24 U/L (ref 0–53)
AST: 31 U/L (ref 0–37)
Alkaline Phosphatase: 53 U/L (ref 39–117)
BUN: 8 mg/dL (ref 6–23)
CALCIUM: 8.8 mg/dL (ref 8.4–10.5)
CO2: 25 mEq/L (ref 19–32)
Chloride: 108 mEq/L (ref 96–112)
Creatinine, Ser: 0.93 mg/dL (ref 0.50–1.35)
GFR calc non Af Amer: >90 mL/min (ref >90)
GLUCOSE: 95 mg/dL (ref 70–99)
Potassium: 3.8 mEq/L (ref 3.7–5.3)
SODIUM: 144 meq/L (ref 137–147)
TOTAL PROTEIN: 6.2 g/dL (ref 6.0–8.3)
Total Bilirubin: <0.2 mg/dL — ABNORMAL LOW (ref 0.3–1.2)

## 2013-05-07 LAB — CBC
HCT: 41.9 % (ref 39.0–52.0)
HEMOGLOBIN: 14.2 g/dL (ref 13.0–17.0)
MCH: 33.5 pg (ref 26.0–34.0)
MCHC: 33.9 g/dL (ref 30.0–36.0)
MCV: 98.8 fL (ref 78.0–100.0)
Platelets: 217 10*3/uL (ref 150–400)
RBC: 4.24 MIL/uL (ref 4.22–5.81)
RDW: 13.7 % (ref 11.5–15.5)
WBC: 5.5 10*3/uL (ref 4.0–10.5)

## 2013-05-07 LAB — ANTITHROMBIN III: AntiThromb III Func: 122 % — ABNORMAL HIGH (ref 75–120)

## 2013-05-07 LAB — PROTIME-INR
INR: 1.41 (ref 0.00–1.49)
Prothrombin Time: 16.9 seconds — ABNORMAL HIGH (ref 11.6–15.2)

## 2013-05-07 LAB — HOMOCYSTEINE: Homocysteine: 8.8 umol/L (ref 4.0–15.4)

## 2013-05-07 LAB — TSH: TSH: 1.302 u[IU]/mL (ref 0.350–4.500)

## 2013-05-07 MED ORDER — WARFARIN SODIUM 7.5 MG PO TABS
7.5000 mg | ORAL_TABLET | Freq: Once | ORAL | Status: AC
  Administered 2013-05-07: 7.5 mg via ORAL
  Filled 2013-05-07: qty 1

## 2013-05-07 MED ORDER — ENOXAPARIN (LOVENOX) PATIENT EDUCATION KIT
PACK | Freq: Once | Status: AC
  Administered 2013-05-07: 18:00:00
  Filled 2013-05-07: qty 1

## 2013-05-07 MED ORDER — HYDROCODONE-ACETAMINOPHEN 5-325 MG PO TABS
1.0000 | ORAL_TABLET | ORAL | Status: DC | PRN
  Administered 2013-05-07: 1 via ORAL
  Filled 2013-05-07: qty 1

## 2013-05-07 MED ORDER — HYDROCOD POLST-CHLORPHEN POLST 10-8 MG/5ML PO LQCR
5.0000 mL | Freq: Four times a day (QID) | ORAL | Status: DC | PRN
  Administered 2013-05-07 – 2013-05-08 (×3): 5 mL via ORAL
  Filled 2013-05-07 (×3): qty 5

## 2013-05-07 MED ORDER — NICOTINE 21 MG/24HR TD PT24
21.0000 mg | MEDICATED_PATCH | Freq: Once | TRANSDERMAL | Status: DC
  Administered 2013-05-07: 21 mg via TRANSDERMAL
  Filled 2013-05-07: qty 1

## 2013-05-07 MED ORDER — METHYLPREDNISOLONE SODIUM SUCC 125 MG IJ SOLR
60.0000 mg | Freq: Two times a day (BID) | INTRAMUSCULAR | Status: DC
  Administered 2013-05-07 – 2013-05-08 (×2): 60 mg via INTRAVENOUS
  Filled 2013-05-07 (×2): qty 2

## 2013-05-07 NOTE — Progress Notes (Signed)
Nutrition Brief Note  Patient identified on the Malnutrition Screening Tool (MST) Report  Wt Readings from Last 15 Encounters:  05/06/13 219 lb (99.338 kg)  04/30/13 217 lb (98.431 kg)  01/18/13 235 lb (106.595 kg)  07/17/12 216 lb (97.977 kg)  11/03/11 196 lb (88.905 kg)  06/26/11 192 lb (87.091 kg)  04/03/11 190 lb (86.183 kg)    Body mass index is 29.7 kg/(m^2). Patient meets criteria for overweight based on current BMI.   Current diet order is Heart Healthy, patient is consuming approximately 100% of meals at this time. Labs and medications reviewed.   No nutrition interventions warranted at this time. If nutrition issues arise, please consult RD.   Ian Grimes A. Jimmye Norman, RD, LDN Pager: [phone]

## 2013-05-07 NOTE — Progress Notes (Signed)
[facility] PROGRESS NOTE  Ian Grimes SWF:[id number] DOB: 1970/10/31 DOA: 05/06/2013 PCP: Redge Gainer, MD    Assessment/Plan: Principal Problem:  Pulmonary emboli: Incidental finding during outpatient workup for worsening shortness of breath. Diagnosed with bronchitis last week and chest x-ray yielded pulmonary nodules so he had a followup CT of the chest this morning which yielded segmental pulmonary emboli within all  lobes. Overall clot burden is moderate. No findings to suggest heart strain. Hypercoagulable panel in process. INR 1.2 this am. Continue Lovenox and Coumadin per pharmacy until INR therapeutic. Oxygen saturation level 96% on 2L. Will wean as indicated.   Active Problems:  Hypokalemia: Mild. Resolved.    Pulmonary nodules: Incidental finding on chest x-ray last week. CT today yields subpleural nodule right middle lobe favored to reflect a benign subpleural lymph node but technically indeterminate. Faint groundglass nodular opacities in left upper lobe possibly infectious/inflammatory. Recommend followup CT in 6 months.   Acute Bronchitis: continues with diffuse wheezing. Continue prednisone and Levaquin day #2. Continue nebs and Tessalon Perles.   Pleural effusion: Trace on right noted in CT.   Depression: Appears stable at baseline continue his home medication  Schizoaffective disorder: Appears stable at baseline continue his home medications  Hyperlipidemia: Triglycerides 167 and HDL 18. Continue his home medications. Recommend OP follow up.   Tobacco abuse: Counseled regarding cessation.  Code Status: full Family Communication: none present Disposition Plan: home when ready hopefully tomorro   Consultants:  none  Procedures:  none  Antibiotics:  Levaquin 05/06/13>>  HPI/Subjective: Reports feeling/breathing better this am. Denies pain/discomfort  Objective: Filed Vitals:   05/07/13 0613  BP: 113/75  Pulse: 75  Temp: 98.4 F (36.9 C)   Resp: 20    Intake/Output Summary (Last 24 hours) at 05/07/13 1045 Last data filed at 05/06/13 1809  Gross per 24 hour  Intake 319.17 ml  Output      0 ml  Net 319.17 ml   Filed Weights   05/06/13 1206 05/06/13 1448  Weight: 99.338 kg (219 lb) 99.338 kg (219 lb)    Exam:   General:  Well nourished NAD  Cardiovascular: S1 and S2. No m/g/r. No LE edema  Respiratory: normal effort. Diffuse expiratory wheeze. No crackles.   Abdomen: soft +BS non-tender to palpation  Musculoskeletal: no clubbing or cyanosis   Data Reviewed: Basic Metabolic Panel:  Recent Labs Lab 05/06/13 1223 05/07/13 0515  NA 139 144  K 3.5* 3.8  CL 102 108  CO2 25 25  GLUCOSE 104* 95  BUN 7 8  CREATININE 0.90 0.93  CALCIUM 8.8 8.8   Liver Function Tests:  Recent Labs Lab 05/06/13 1223 05/07/13 0515  AST 32 31  ALT 26 24  ALKPHOS 58 53  BILITOT 0.2* <0.2*  PROT 6.5 6.2  ALBUMIN 3.4* 3.3*   No results found for this basename: LIPASE, AMYLASE,  in the last 168 hours No results found for this basename: AMMONIA,  in the last 168 hours CBC:  Recent Labs Lab 05/06/13 1223 05/07/13 0515  WBC 8.0 5.5  NEUTROABS 6.1  --   HGB 14.3 14.2  HCT 41.4 41.9  MCV 97.2 98.8  PLT 229 217   Cardiac Enzymes: No results found for this basename: CKTOTAL, CKMB, CKMBINDEX, TROPONINI,  in the last 168 hours BNP (last 3 results) No results found for this basename: PROBNP,  in the last 8760 hours CBG: No results found for this basename: GLUCAP,  in the last 168 hours  No results  found for this or any previous visit (from the past 240 hour(s)).   Studies: Ct Chest W Contrast  05/06/2013   CLINICAL DATA:  Chest pain, cough, right lower lobe nodules on chest radiograph  EXAM: CT CHEST WITH CONTRAST  TECHNIQUE: Multidetector CT imaging of the chest was performed during intravenous contrast administration.  CONTRAST:  15mL OMNIPAQUE IOHEXOL 300 MG/ML  SOLN  COMPARISON:  Chest radiographs dated  04/30/2013  FINDINGS: Study was not performed as a CTA for evaluation of the pulmonary arteries. However, there are segmental pulmonary emboli within all lobes. Overall clot burden is moderate. No findings to suggest right heart strain. RV-to-LV ratio 0.85.  8 x 6 mm triangular subpleural nodular opacity in the lateral right middle lobe, corresponding to the radiographic abnormality, favored to reflect a subpleural lymph node but technically indeterminate.  Faint ground-glass nodular opacities in the left upper lobe/ lingula (series 3/ images 27, 29, 31, and 34).  Mild paraseptal emphysematous changes. Trace right pleural effusion. No pneumothorax.  Heart is normal in size. No pericardial effusion. Ectasia of the ascending thoracic aorta measuring 4.0 cm (series 2/image 25).  Small mediastinal lymph nodes, including a mildly prominent subcarinal node measuring 11 mm short axis (series 2/image 29).  Visualized upper abdomen is unremarkable.  Mild degenerative changes of the visualized thoracolumbar spine.  IMPRESSION: Segmental pulmonary emboli within all lobes. Overall clot burden is moderate.  8 x 6 mm triangular subpleural nodule in the lateral right middle lobe, corresponding to the radiographic abnormality, favored to reflect a benign subpleural lymph node but technically indeterminate. Given patient's high risk for primary bronchogenic neoplasm, initial follow-up CT chest is suggested in 6 months.  Faint ground-glass nodular opacities in the left upper lobe/lingula, possibly infectious/inflammatory.  Trace right pleural effusion.  Nodule recommendation follows the consensus statement: Guidelines for Management of Small Pulmonary Nodules Detected on CT Scans: A Statement from the [place] as published in Radiology 2005; 237:395-400.  Critical value/emergent results were called by telephone at the time of interpretation on 05/06/2013 at 9:12 AM to [facility], who verbally acknowledged these results.    Electronically Signed   By: Julian Hy M.D.   On: 05/06/2013 09:35    Scheduled Meds: . benzonatate  100 mg Oral BID  . clonazePAM  1 mg Oral TID  . docusate sodium  100 mg Oral BID  . enoxaparin (LOVENOX) injection  1 mg/kg Subcutaneous Q12H  . ezetimibe  10 mg Oral Daily  . fenofibrate  54 mg Oral Daily  . ipratropium-albuterol  3 mL Nebulization Q6H  . levofloxacin  500 mg Oral Daily  . nicotine  21 mg Transdermal Daily  . predniSONE  60 mg Oral Q breakfast  . risperidone  4 mg Oral BID  . sertraline  100 mg Oral BID  . simvastatin  5 mg Oral q1800  . sodium chloride  3 mL Intravenous Q12H  . Warfarin - Pharmacist Dosing Inpatient   Does not apply Q24H   Continuous Infusions:   Principal Problem:   Pulmonary emboli Active Problems:   Depression   Schizoaffective disorder   Hyperlipidemia   Hypokalemia   Pulmonary nodules   Pleural effusion   Pulmonary embolism   Tobacco use   Acute bronchitis    Time spent: 30 minutes    [facility] Pager [phone]. If 7PM-7AM, please contact night-coverage at [email or web address], password [email or web address] 05/07/2013, 10:45 AM  LOS: 1 day

## 2013-05-07 NOTE — Progress Notes (Signed)
[place] for Lovenox --> Coumadin Indication: pulmonary embolus  Allergies  Allergen Reactions  . Bactrim Rash   Patient Measurements: Height: 6' (182.9 cm) Weight: 219 lb (99.338 kg) IBW/kg (Calculated) : 77.6  Vital Signs: Temp: 98.4 F (36.9 C) (03/10 0613) Temp src: Oral (03/10 0613) BP: 113/75 mmHg (03/10 0613) Pulse Rate: 75 (03/10 0613)  Labs:  Recent Labs  05/06/13 1223 05/07/13 0515  HGB 14.3 14.2  HCT 41.4 41.9  PLT 229 217  LABPROT 12.9 16.9*  INR 0.99 1.41  CREATININE 0.90 0.93   Estimated Creatinine Clearance: 126.3 ml/min (by C-G formula based on Cr of 0.93).  Medical History: Past Medical History  Diagnosis Date  . Schizo-affective psychosis   . Anxiety   . Depression   . HA (headache)   . Hyperlipidemia   . Pulmonary emboli   . Pulmonary nodule     follow up CT 6 months (due 9/15)  . Pleural effusion    Medications:  Scheduled:  . benzonatate  100 mg Oral BID  . clonazePAM  1 mg Oral TID  . docusate sodium  100 mg Oral BID  . enoxaparin (LOVENOX) injection  1 mg/kg Subcutaneous Q12H  . ezetimibe  10 mg Oral Daily  . fenofibrate  54 mg Oral Daily  . ipratropium-albuterol  3 mL Nebulization Q6H  . levofloxacin  500 mg Oral Daily  . nicotine  21 mg Transdermal Daily  . predniSONE  60 mg Oral Q breakfast  . risperidone  4 mg Oral BID  . sertraline  100 mg Oral BID  . simvastatin  5 mg Oral q1800  . sodium chloride  3 mL Intravenous Q12H  . Warfarin - Pharmacist Dosing Inpatient   Does not apply Q24H   Assessment: 43 yo M with newly diagnosed PE to start on Lovenox--> Coumadin bridge.  Overlap day# 2/5. Renal function is at patient's baseline.  No bleeding noted.   Goal of Therapy:  INR 2-3 Anti-Xa level 0.6-1 units/ml 4hrs after LMWH dose given Monitor platelets by anticoagulation protocol: Yes   Plan:  Coumadin 7.5mg  po x1 today Lovenox 100mg  sq q12h for minimum 5 days & until INR>2 x  24hrs Daily INR Monitor CBC Initiate warfarin education  Hart Robinsons A 05/07/2013,11:36 AM

## 2013-05-07 NOTE — Progress Notes (Signed)
The patient was seen and examined. His chart, vital signs, and laboratory studies were reviewed. He was discussed with nurse practitioner, Ms. Renard Hamper. Agree with her findings and plan. The patient is only slightly improved symptomatically. We'll continue treatment plan with exception as follows. The patient will need a five-day overlap of Lovenox and warfarin or until his INR is 2 or greater. I am not sure if Medicaid will pay for the newer anticoagulants. I have asked the Free manager to check on this. If Medicaid will pay for Xarelto, for example, he will not need a  five-day overlap. Given his persistent bronchospasms, I favor starting low-dose IV Solu-Medrol for the next 24-48 hours and then resume prednisone afterwards.  His TSH is within normal limits. Hypercoagulable panel is pending.

## 2013-05-08 ENCOUNTER — Inpatient Hospital Stay (HOSPITAL_COMMUNITY): Payer: Medicare Other

## 2013-05-08 LAB — LUPUS ANTICOAGULANT PANEL
DRVVT: 39.9 secs (ref <42.9)
LUPUS ANTICOAGULANT: NOT DETECTED
PTT Lupus Anticoagulant: 68.6 secs — ABNORMAL HIGH (ref 28.0–43.0)
PTTLA 4:1 Mix: 49.8 secs — ABNORMAL HIGH (ref 28.0–43.0)
PTTLA Confirmation: 5.9 secs (ref <8.0)

## 2013-05-08 LAB — CARDIOLIPIN ANTIBODIES, IGG, IGM, IGA
Anticardiolipin IgA: 6 APL U/mL — ABNORMAL LOW (ref <22)
Anticardiolipin IgG: 20 GPL U/mL (ref <23)
Anticardiolipin IgM: 4 MPL U/mL — ABNORMAL LOW (ref <11)

## 2013-05-08 LAB — PROTEIN C, TOTAL: Protein C, Total: 53 % — ABNORMAL LOW (ref 72–160)

## 2013-05-08 LAB — PROTIME-INR
INR: 1.76 — ABNORMAL HIGH (ref 0.00–1.49)
Prothrombin Time: 20 seconds — ABNORMAL HIGH (ref 11.6–15.2)

## 2013-05-08 LAB — CBC
HEMATOCRIT: 45.5 % (ref 39.0–52.0)
HEMOGLOBIN: 15.2 g/dL (ref 13.0–17.0)
MCH: 33.2 pg (ref 26.0–34.0)
MCHC: 33.4 g/dL (ref 30.0–36.0)
MCV: 99.3 fL (ref 78.0–100.0)
Platelets: 234 10*3/uL (ref 150–400)
RBC: 4.58 MIL/uL (ref 4.22–5.81)
RDW: 13.8 % (ref 11.5–15.5)
WBC: 4.7 10*3/uL (ref 4.0–10.5)

## 2013-05-08 LAB — FACTOR 5 LEIDEN

## 2013-05-08 LAB — PROTEIN C ACTIVITY: Protein C Activity: 60 % — ABNORMAL LOW (ref 75–133)

## 2013-05-08 LAB — PROTHROMBIN GENE MUTATION

## 2013-05-08 LAB — BETA-2-GLYCOPROTEIN I ABS, IGG/M/A
BETA 2 GLYCO I IGG: 1 G Units (ref <20)
BETA-2-GLYCOPROTEIN I IGA: 3 A Units (ref <20)
Beta-2-Glycoprotein I IgM: 4 M Units (ref <20)

## 2013-05-08 LAB — PROTEIN S, TOTAL: Protein S Ag, Total: 87 % (ref 60–150)

## 2013-05-08 LAB — PROTEIN S ACTIVITY: Protein S Activity: 91 % (ref 69–129)

## 2013-05-08 MED ORDER — RIVAROXABAN 15 MG PO TABS: 15.0000 mg | ORAL_TABLET | Freq: Two times a day (BID) | ORAL | Status: DC

## 2013-05-08 MED ORDER — BENZONATATE 100 MG PO CAPS: 100.0000 mg | ORAL_CAPSULE | Freq: Two times a day (BID) | ORAL | Status: DC

## 2013-05-08 MED ORDER — NICOTINE 21 MG/24HR TD PT24: 21.0000 mg | MEDICATED_PATCH | Freq: Every day | TRANSDERMAL | Status: DC

## 2013-05-08 MED ORDER — PREDNISONE 10 MG PO TABS: ORAL_TABLET | ORAL | Status: DC

## 2013-05-08 MED ORDER — SERTRALINE HCL 100 MG PO TABS: 100.0000 mg | ORAL_TABLET | Freq: Two times a day (BID) | ORAL | Status: DC

## 2013-05-08 MED ORDER — RIVAROXABAN 20 MG PO TABS: 20.0000 mg | ORAL_TABLET | Freq: Every day | ORAL | Status: DC

## 2013-05-08 MED ORDER — RIVAROXABAN 15 MG PO TABS
15.0000 mg | ORAL_TABLET | Freq: Two times a day (BID) | ORAL | Status: DC
  Administered 2013-05-08: 15 mg via ORAL
  Filled 2013-05-08: qty 1

## 2013-05-08 MED ORDER — LEVOFLOXACIN 500 MG PO TABS: 500.0000 mg | ORAL_TABLET | Freq: Every day | ORAL | Status: DC

## 2013-05-08 MED ORDER — HYDROCOD POLST-CHLORPHEN POLST 10-8 MG/5ML PO LQCR: 5.0000 mL | Freq: Four times a day (QID) | ORAL | Status: DC | PRN

## 2013-05-08 NOTE — Discharge Summary (Signed)
Physician Discharge Summary  Ian Grimes [id number] DOB: 01/13/1971 DOA: 05/06/2013  PCP: Redge Gainer, MD  Admit date: 05/06/2013 Discharge date: 05/08/2013  Time spent: 40 minutes  Recommendations for Outpatient Follow-up:  1. Has appointment with PCP 05/15/13 at 8:30 am. Follow up PE and recommend CBC as patient on xarelto. Evaluate respiratory status and follow coag panel and LE doppler results.  2. Will likely need follow up CT of the lungs in 6 months to follow nodules noted   Discharge Diagnoses:  Principal Problem:   Pulmonary emboli Active Problems:   Depression   Schizoaffective disorder   Hyperlipidemia   Hypokalemia   Pulmonary nodules   Pleural effusion   Pulmonary embolism   Tobacco use   Acute bronchitis   Discharge Condition: stable  Diet recommendation: heart healthy  Filed Weights   05/06/13 1206 05/06/13 1448  Weight: 99.338 kg (219 lb) 99.338 kg (219 lb)    History of present illness:  Ian Grimes is a 43 y.o. male With a past medical history that includes Schiz-affective disorder, anxiety, hyperlipidemia presented to the emergency department on 05/06/13 with chief complaint of abnormal chest CT. Chest CT done earlier on 05/06/13 with pulmonary emboli and he was referred for admission.   He reported that he had experienced gradual worsening shortness of breath over the previoos 2-3 weeks. He saw his primary care provider on March 3 at which time he had a chest x-ray and was given amoxicillin which he was still taking and prednisone. He stated that he had not improved. At that time it was recommended that he have a outpatient CT of the chest to followup on pulmonary nodule noted on chest x-ray. He had that CT  And he reported his primary care provider called him and told him to come to the emergency room  that CT of the chest revealed pulmonary emboli. He denied chest pain palpitations. He denied any history of DVT or PE. He denied any familial history of  DVT or PE. He denied lower extremity edema but  reported some bilateral lower extremity tenderness. Associated symptoms include cough with white thick sputum, nausea without vomiting, decreased by mouth intake, 2 episodes of loose stool. He reported that he is quite sedentary and has been so for the last 8 months. He reported that he spends his day lying on the couch. In addition he is a heavy smoker.   Initial workup in the emergency department yielded a basic metabolic panel with a potassium of 3.5 and a serum glucose was 104. Vital signs were stable with a heart rate range of 94-107 and respiratory rate of 22-32 . Oxygen saturation levels were 94-96% on 2 L nasal cannula. EKG with normal sinus rhythm.   Hospital Course:  Principal Problem:  Pulmonary emboli: Incidental finding during outpatient workup for worsening shortness of breath. Diagnosed with bronchitis and chest x-ray yielded pulmonary nodules so he had a followup CT of the chest which yielded segmental pulmonary emboli within all lobes. Overall clot burden is moderate. No findings to suggest heart strain. Etiology unclear. No hx of same. Hypercoagulable panel in process at discharge and will need to be followed by PCP. Doppler of LE pending at discharge.  He was started on coumadin with Lovenox. It was then determined that xarelto could be provided and therapy changed to zarelto. He received education regarding medication. At discharge oxygen saturation level 93% on room air.   Active Problems:  Hypokalemia: Mild. Resolved.   Pulmonary nodules:  Incidental finding on chest x-ray last week. CT yields subpleural nodule right middle lobe favored to reflect a benign subpleural lymph node but technically indeterminate. Faint groundglass nodular opacities in left upper lobe possibly infectious/inflammatory. Recommend followup CT in 6 months.  Acute Bronchitis: Much improved at discharge. Provide with nebs and solumedrol and levaquin.  Will discharge  on prednisone taper and levaquin to complete 5 day course.    Pleural effusion: Trace on right noted in CT.   Depression: Remained stable at baseline.   Schizoaffective disorder: Remained stable at baseline.   Hyperlipidemia: Triglycerides 167 and HDL 18. Continue his home medications. Recommend OP follow up.   Tobacco abuse: Counseled regarding cessation.   Procedures: LE doppler 05/08/13 Consultations:  none  Discharge Exam: Filed Vitals:   05/08/13 0421  BP: 125/86  Pulse: 72  Temp: 96.7 F (35.9 C)  Resp: 20    General: well nourished NAD Cardiovascular: S1 S2 no mgr No LE edema Respiratory: normal effort BS somewhat coarse with improved air flow. No wheeze diffuse rhonchi  Discharge Instructions  Discharge Orders   Future Appointments Provider Department Dept Phone   05/15/2013 8:15 AM Lysbeth Penner, [facility] [phone]   Future Orders Complete By Expires   Diet - low sodium heart healthy  As directed    Discharge instructions  As directed    Comments:     Take medication as directed Follow up with PCP as scheduled.   Increase activity slowly  As directed        Medication List    STOP taking these medications       amoxicillin 875 MG tablet  Commonly known as:  AMOXIL      TAKE these medications       benzonatate 100 MG capsule  Commonly known as:  TESSALON  Take 1 capsule (100 mg total) by mouth 2 (two) times daily.     chlorpheniramine-HYDROcodone 10-8 MG/5ML Lqcr  Commonly known as:  TUSSIONEX  Take 5 mLs by mouth every 6 (six) hours as needed for cough.     clonazePAM 1 MG tablet  Commonly known as:  KLONOPIN  Take 1 mg by mouth 3 (three) times daily.     ezetimibe 10 MG tablet  Commonly known as:  ZETIA  Take 1 tablet (10 mg total) by mouth daily.     fenofibrate micronized 130 MG capsule  Commonly known as:  ANTARA  Take 1 capsule (130 mg total) by mouth daily before breakfast.     ibuprofen 200  MG tablet  Commonly known as:  ADVIL,MOTRIN  Take 800 mg by mouth 2 (two) times daily as needed. For pain     levofloxacin 500 MG tablet  Commonly known as:  LEVAQUIN  Take 1 tablet (500 mg total) by mouth daily.     pravastatin 40 MG tablet  Commonly known as:  PRAVACHOL  Take 1 tablet (40 mg total) by mouth daily.     predniSONE 10 MG tablet  Commonly known as:  DELTASONE  Take 4 tabs for 2 days starting 05/09/13, then take 2 tabs for 2 days then take 1 tab for 2 days then stop.     risperidone 4 MG tablet  Commonly known as:  RISPERDAL  Take 4 mg by mouth 2 (two) times daily.     Rivaroxaban 15 MG Tabs tablet  Commonly known as:  XARELTO  Take 1 tablet (15 mg total) by mouth 2 (two) times daily  with a meal.     Rivaroxaban 20 MG Tabs tablet  Commonly known as:  XARELTO  Take 1 tablet (20 mg total) by mouth daily with supper.  Start taking on:  05/29/2013     sertraline 100 MG tablet  Commonly known as:  ZOLOFT  Take 1 tablet (100 mg total) by mouth 2 (two) times daily.       Allergies  Allergen Reactions  . Bactrim Rash       Follow-up Information   Follow up with Lysbeth Penner, FNP On 05/15/2013. (appointment at 8:1)    Specialty:  Family Medicine   Contact information:   [place] [place] [address] [phone]        The results of significant diagnostics from this hospitalization (including imaging, microbiology, ancillary and laboratory) are listed below for reference.    Significant Diagnostic Studies: Dg Chest 2 View  04/30/2013   CLINICAL DATA:  Bronchitis  EXAM: CHEST  2 VIEW  COMPARISON:  DG RIBS W/ CHEST 3+V*L* dated 03/04/2013; DG CHEST 2 VIEW dated 06/26/2011; DG CHEST 2 VIEW dated 01/14/2011  FINDINGS: The heart size and vascular pattern are normal. Small anterior left seventh rib osteochondromata stable. Left lung is otherwise clear. On the right, there is a nodular opacity laterally at the base, which measures 9 mm. This is not seen on prior  studies. There is also a 5 mm lower lobe nodular opacity that is not present on prior studies.  There is no evidence of significant peribronchial wall thickening.  IMPRESSION: No evidence of pneumonia.  No significant bronchial wall thickening.  CT thorax is however recommended for 2 new right lower lobe pulmonary nodules.  These results will be called to the ordering clinician or representative by the Radiologist Assistant, and communication documented in the PACS Dashboard.   Electronically Signed   By: Skipper Cliche M.D.   On: 04/30/2013 13:03   Ct Chest W Contrast  05/06/2013   CLINICAL DATA:  Chest pain, cough, right lower lobe nodules on chest radiograph  EXAM: CT CHEST WITH CONTRAST  TECHNIQUE: Multidetector CT imaging of the chest was performed during intravenous contrast administration.  CONTRAST:  66mL OMNIPAQUE IOHEXOL 300 MG/ML  SOLN  COMPARISON:  Chest radiographs dated 04/30/2013  FINDINGS: Study was not performed as a CTA for evaluation of the pulmonary arteries. However, there are segmental pulmonary emboli within all lobes. Overall clot burden is moderate. No findings to suggest right heart strain. RV-to-LV ratio 0.85.  8 x 6 mm triangular subpleural nodular opacity in the lateral right middle lobe, corresponding to the radiographic abnormality, favored to reflect a subpleural lymph node but technically indeterminate.  Faint ground-glass nodular opacities in the left upper lobe/ lingula (series 3/ images 27, 29, 31, and 34).  Mild paraseptal emphysematous changes. Trace right pleural effusion. No pneumothorax.  Heart is normal in size. No pericardial effusion. Ectasia of the ascending thoracic aorta measuring 4.0 cm (series 2/image 25).  Small mediastinal lymph nodes, including a mildly prominent subcarinal node measuring 11 mm short axis (series 2/image 29).  Visualized upper abdomen is unremarkable.  Mild degenerative changes of the visualized thoracolumbar spine.  IMPRESSION: Segmental  pulmonary emboli within all lobes. Overall clot burden is moderate.  8 x 6 mm triangular subpleural nodule in the lateral right middle lobe, corresponding to the radiographic abnormality, favored to reflect a benign subpleural lymph node but technically indeterminate. Given patient's high risk for primary bronchogenic neoplasm, initial follow-up CT chest is  suggested in 6 months.  Faint ground-glass nodular opacities in the left upper lobe/lingula, possibly infectious/inflammatory.  Trace right pleural effusion.  Nodule recommendation follows the consensus statement: Guidelines for Management of Small Pulmonary Nodules Detected on CT Scans: A Statement from the [employer] as published in Radiology 2005; 237:395-400.  Critical value/emergent results were called by telephone at the time of interpretation on 05/06/2013 at 9:12 AM to [facility], who verbally acknowledged these results.   Electronically Signed   By: Julian Hy M.D.   On: 05/06/2013 09:35    Microbiology: No results found for this or any previous visit (from the past 240 hour(s)).   Labs: Basic Metabolic Panel:  Recent Labs Lab 05/06/13 1223 05/07/13 0515  NA 139 144  K 3.5* 3.8  CL 102 108  CO2 25 25  GLUCOSE 104* 95  BUN 7 8  CREATININE 0.90 0.93  CALCIUM 8.8 8.8   Liver Function Tests:  Recent Labs Lab 05/06/13 1223 05/07/13 0515  AST 32 31  ALT 26 24  ALKPHOS 58 53  BILITOT 0.2* <0.2*  PROT 6.5 6.2  ALBUMIN 3.4* 3.3*   No results found for this basename: LIPASE, AMYLASE,  in the last 168 hours No results found for this basename: AMMONIA,  in the last 168 hours CBC:  Recent Labs Lab 05/06/13 1223 05/07/13 0515 05/08/13 0500  WBC 8.0 5.5 4.7  NEUTROABS 6.1  --   --   HGB 14.3 14.2 15.2  HCT 41.4 41.9 45.5  MCV 97.2 98.8 99.3  PLT 229 217 234   Cardiac Enzymes: No results found for this basename: CKTOTAL, CKMB, CKMBINDEX, TROPONINI,  in the last 168 hours BNP: BNP (last 3 results) No  results found for this basename: PROBNP,  in the last 8760 hours CBG: No results found for this basename: GLUCAP,  in the last 168 hours     Signed:  Radene Gunning  [facility] 05/08/2013, 1:09 PM

## 2013-05-08 NOTE — Progress Notes (Signed)
Patient discharged home with instructions given on medications,and follow up visits,patient verbalized understanding. Prescriptions sent with patient. Accompanied by staff to an awaiting vehicle. 

## 2013-05-08 NOTE — Discharge Instructions (Addendum)
Information on my medicine - XARELTO (rivaroxaban)  This medication education was reviewed with me or my healthcare representative as part of my discharge preparation.  The pharmacist that spoke with me during my hospital stay was:  Ena Dawley, [place]? Xarelto was prescribed to treat blood clots that may have been found in the veins of your legs (deep vein thrombosis) or in your lungs (pulmonary embolism) and to reduce the risk of them occurring again.  What do you need to know about Xarelto? The starting dose is one 15 mg tablet taken TWICE daily with food for the FIRST 21 DAYS then on (enter date)  05/29/13  the dose is changed to one 20 mg tablet taken ONCE A DAY with your evening meal.  DO NOT stop taking Xarelto without talking to the health care provider who prescribed the medication.  Refill your prescription for 20 mg tablets before you run out.  After discharge, you should have regular check-up appointments with your healthcare provider that is prescribing your Xarelto.  In the future your dose may need to be changed if your kidney function changes by a significant amount.  What do you do if you miss a dose? If you are taking Xarelto TWICE DAILY and you miss a dose, take it as soon as you remember. You may take two 15 mg tablets (total 30 mg) at the same time then resume your regularly scheduled 15 mg twice daily the next day.  If you are taking Xarelto ONCE DAILY and you miss a dose, take it as soon as you remember on the same day then continue your regularly scheduled once daily regimen the next day. Do not take two doses of Xarelto at the same time.   Important Safety Information Xarelto is a blood thinner medicine that can cause bleeding. You should call your healthcare provider right away if you experience any of the following:   Bleeding from an injury or your nose that does not stop.   Unusual colored urine (red or dark brown) or  unusual colored stools (red or black).   Unusual bruising for unknown reasons.   A serious fall or if you hit your head (even if there is no bleeding).  Some medicines may interact with Xarelto and might increase your risk of bleeding while on Xarelto. To help avoid this, consult your healthcare provider or pharmacist prior to using any new prescription or non-prescription medications, including herbals, vitamins, non-steroidal anti-inflammatory drugs (NSAIDs such as Ibuprofen) and supplements.  This website has more information on Xarelto: [email or web address]

## 2013-05-08 NOTE — Progress Notes (Deleted)
Ian Grimes for Lovenox --> Coumadin Indication: pulmonary embolus  Allergies  Allergen Reactions  . Bactrim Rash   Patient Measurements: Height: 6' (182.9 cm) Weight: 219 lb (99.338 kg) IBW/kg (Calculated) : 77.6  Vital Signs: Temp: 96.7 F (35.9 C) (03/11 0421) Temp src: Oral (03/11 0421) BP: 125/86 mmHg (03/11 0421) Pulse Rate: 72 (03/11 0421)  Labs:  Recent Labs  05/06/13 1223 05/07/13 0515 05/08/13 0500  HGB 14.3 14.2 15.2  HCT 41.4 41.9 45.5  PLT 229 217 234  LABPROT 12.9 16.9* 20.0*  INR 0.99 1.41 1.76*  CREATININE 0.90 0.93  --    Estimated Creatinine Clearance: 126.3 ml/min (by C-G formula based on Cr of 0.93).  Medical History: Past Medical History  Diagnosis Date  . Schizo-affective psychosis   . Anxiety   . Depression   . HA (headache)   . Hyperlipidemia   . Pulmonary emboli   . Pulmonary nodule     follow up CT 6 months (due 9/15)  . Pleural effusion    Medications:  Scheduled:  . benzonatate  100 mg Oral BID  . clonazePAM  1 mg Oral TID  . docusate sodium  100 mg Oral BID  . ezetimibe  10 mg Oral Daily  . fenofibrate  54 mg Oral Daily  . ipratropium-albuterol  3 mL Nebulization Q6H  . levofloxacin  500 mg Oral Daily  . methylPREDNISolone (SOLU-MEDROL) injection  60 mg Intravenous Q12H  . nicotine  21 mg Transdermal Daily  . nicotine  21 mg Transdermal Once  . risperidone  4 mg Oral BID  . rivaroxaban  15 mg Oral BID WC   Followed by  . [START ON 05/29/2013] rivaroxaban  20 mg Oral Q supper  . sertraline  100 mg Oral BID  . simvastatin  5 mg Oral q1800  . sodium chloride  3 mL Intravenous Q12H   Assessment: 43 yo M with newly diagnosed PE to start on Lovenox--> Coumadin bridge.  Overlap day# 3/5. Renal function is at patient's baseline.  No bleeding noted.   Goal of Therapy:  INR 2-3 Anti-Xa level 0.6-1 units/ml 4hrs after LMWH dose given Monitor platelets by anticoagulation protocol: Yes    Plan:  Coumadin 5mg  po x1 today Lovenox 100mg  sq q12h for minimum 5 days & until INR>2 x 24hrs Daily INR Monitor CBC Warfarin education  Hart Robinsons A 05/08/2013,11:14 AM

## 2013-05-08 NOTE — Progress Notes (Signed)
Ian Grimes for XARELTO Indication: pulmonary embolus  Allergies  Allergen Reactions  . Bactrim Rash   Patient Measurements: Height: 6' (182.9 cm) Weight: 219 lb (99.338 kg) IBW/kg (Calculated) : 77.6  Vital Signs: Temp: 96.7 F (35.9 C) (03/11 0421) Temp src: Oral (03/11 0421) BP: 125/86 mmHg (03/11 0421) Pulse Rate: 72 (03/11 0421)  Labs:  Recent Labs  05/06/13 1223 05/07/13 0515 05/08/13 0500  HGB 14.3 14.2 15.2  HCT 41.4 41.9 45.5  PLT 229 217 234  LABPROT 12.9 16.9* 20.0*  INR 0.99 1.41 1.76*  CREATININE 0.90 0.93  --    Estimated Creatinine Clearance: 126.3 ml/min (by C-G formula based on Cr of 0.93).  Medical History: Past Medical History  Diagnosis Date  . Schizo-affective psychosis   . Anxiety   . Depression   . HA (headache)   . Hyperlipidemia   . Pulmonary emboli   . Pulmonary nodule     follow up CT 6 months (due 9/15)  . Pleural effusion    Medications:  Scheduled:  . benzonatate  100 mg Oral BID  . clonazePAM  1 mg Oral TID  . docusate sodium  100 mg Oral BID  . ezetimibe  10 mg Oral Daily  . fenofibrate  54 mg Oral Daily  . ipratropium-albuterol  3 mL Nebulization Q6H  . levofloxacin  500 mg Oral Daily  . methylPREDNISolone (SOLU-MEDROL) injection  60 mg Intravenous Q12H  . nicotine  21 mg Transdermal Daily  . nicotine  21 mg Transdermal Once  . risperidone  4 mg Oral BID  . rivaroxaban  15 mg Oral BID WC   Followed by  . [START ON 05/29/2013] rivaroxaban  20 mg Oral Q supper  . sertraline  100 mg Oral BID  . simvastatin  5 mg Oral q1800  . sodium chloride  3 mL Intravenous Q12H   Assessment: 43 yo M with newly diagnosed PE to start on Lovenox--> Coumadin bridge.  Patient has been getting Lovenox and Coumadin for the past 2 days but today MD switched patient to Xarelto.     Renal function is at patient's baseline.  Estimated Creatinine Clearance: 126.3 ml/min (by C-G formula based on Cr of  0.93). No bleeding noted.   Goal of Therapy:  Full dose anticoagulation with XARELTO Monitor platelets by anticoagulation protocol: Yes   Plan:   Xarelto 15mg  po BID x 21 days then 20mg  po daily thereafter  Monitor CBC  Xarelto education  Hart Robinsons A 05/08/2013,11:31 AM

## 2013-05-08 NOTE — Discharge Summary (Addendum)
Patient seen and examined. I agree with the discharge summary outlined above.  Clinically stable for discharge home. Lower extremity Dopplers negative for DVT. Plan on discharging home on xarelto with outpt PCP follow up next week. Hypercoagulable w/up pending and needs follow up as outpt. -nicotinepatch prescribed and counseled strongly on smoking cessation

## 2013-05-15 ENCOUNTER — Ambulatory Visit: Payer: Medicare Other | Admitting: Family Medicine

## 2013-05-21 ENCOUNTER — Encounter: Payer: Self-pay | Admitting: Family Medicine

## 2013-05-21 ENCOUNTER — Ambulatory Visit (INDEPENDENT_AMBULATORY_CARE_PROVIDER_SITE_OTHER): Payer: Medicare Other | Admitting: Family Medicine

## 2013-05-21 VITALS — BP 95/73 | HR 72 | Temp 96.8°F | Ht 72.0 in | Wt 212.2 lb

## 2013-05-21 DIAGNOSIS — R531 Weakness: Secondary | ICD-10-CM

## 2013-05-21 DIAGNOSIS — R911 Solitary pulmonary nodule: Secondary | ICD-10-CM | POA: Diagnosis not present

## 2013-05-21 DIAGNOSIS — I2699 Other pulmonary embolism without acute cor pulmonale: Secondary | ICD-10-CM | POA: Diagnosis not present

## 2013-05-21 DIAGNOSIS — D510 Vitamin B12 deficiency anemia due to intrinsic factor deficiency: Secondary | ICD-10-CM | POA: Diagnosis not present

## 2013-05-21 DIAGNOSIS — E531 Pyridoxine deficiency: Secondary | ICD-10-CM | POA: Diagnosis not present

## 2013-05-21 DIAGNOSIS — R0602 Shortness of breath: Secondary | ICD-10-CM

## 2013-05-21 DIAGNOSIS — R5383 Other fatigue: Secondary | ICD-10-CM | POA: Diagnosis not present

## 2013-05-21 DIAGNOSIS — R5381 Other malaise: Secondary | ICD-10-CM | POA: Diagnosis not present

## 2013-05-21 LAB — POCT CBC
Granulocyte percent: 65.5 %G (ref 37–80)
HCT, POC: 53.4 % (ref 43.5–53.7)
Hemoglobin: 17.4 g/dL (ref 14.1–18.1)
Lymph, poc: 3.5 — AB (ref 0.6–3.4)
MCH, POC: 31.7 pg — AB (ref 27–31.2)
MCHC: 32.6 g/dL (ref 31.8–35.4)
MCV: 97 fL (ref 80–97)
MPV: 8.7 fL (ref 0–99.8)
POC Granulocyte: 7.9 — AB (ref 2–6.9)
POC LYMPH PERCENT: 29.3 %L (ref 10–50)
Platelet Count, POC: 269 10*3/uL (ref 142–424)
RBC: 5.5 M/uL (ref 4.69–6.13)
RDW, POC: 13.7 %
WBC: 12.1 10*3/uL — AB (ref 4.6–10.2)

## 2013-05-21 NOTE — Progress Notes (Signed)
Subjective:    Patient ID: Ian Grimes, male    DOB: 12-13-1970, 44 y.o.   MRN: [id number]  HPI This 43 y.o. male presents for evaluation of hospital follow up.  He has been hospitalized for bilateral pulmonary embolism dx on 05/06/13.  He was seen 05/03/13 for sob and bronchitis sx's and was put On abx's and cxr was ordered which showed right lung nodular opacity and then a CT was ordered which showed segmental PE's in all lobes and 8x6 mm triangular subpleural nodular opacity in the lateral right middle lobe.  He was sent to the ED and was subsequently admitted and was put on anticoagulation, lovenox to bridge coumadin tx and then changed to xarelto.  He had [place] of Le's which was negative for dvt.  He did have protien C deficiency on pending coag studies.  He is c/o  SOB And weakness in visit today. Review of Systems C/o SOB   No chest pain, HA, dizziness, vision change, N/V, diarrhea, constipation, dysuria, urinary urgency or frequency, myalgias, arthralgias or rash.  Objective:   Physical Exam  Vital signs noted  Well developed well nourished male.  HEENT - Head atraumatic Normocephalic                Eyes - PERRLA, Conjuctiva - clear Sclera- Clear EOMI                Ears - EAC's Wnl TM's Wnl Gross Hearing WNL                Nose - Nares patent                 Throat - oropharanx wnl Respiratory - Lungs CTA bilateral Cardiac - RRR S1 and S2 without murmur GI - Abdomen soft Nontender and bowel sounds active x 4 Extremities - No edema. Neuro - Grossly intact.      Assessment & Plan:  Weakness - Plan: Ambulatory referral to Pulmonology, POCT CBC, CMP14+EGFR, Vitamin B12  Pulmonary nodule, right - Plan: Ambulatory referral to Pulmonology  Shortness of breath - Plan: Ambulatory referral to Pulmonology  Pulmonary embolism - Plan: Ambulatory referral to Pulmonology.  Continue xaralto  Follow up in 2 weeks  Lysbeth Penner FNP

## 2013-05-22 LAB — CMP14+EGFR
ALT: 21 IU/L (ref 0–44)
AST: 22 IU/L (ref 0–40)
Albumin/Globulin Ratio: 2.1 (ref 1.1–2.5)
Albumin: 4.8 g/dL (ref 3.5–5.5)
Alkaline Phosphatase: 61 IU/L (ref 39–117)
BUN/Creatinine Ratio: 8 — ABNORMAL LOW (ref 9–20)
BUN: 10 mg/dL (ref 6–24)
CO2: 21 mmol/L (ref 18–29)
Calcium: 9.9 mg/dL (ref 8.7–10.2)
Chloride: 105 mmol/L (ref 97–108)
Creatinine, Ser: 1.19 mg/dL (ref 0.76–1.27)
GFR calc Af Amer: 87 mL/min/{1.73_m2} (ref >59)
GFR calc non Af Amer: 75 mL/min/{1.73_m2} (ref >59)
Globulin, Total: 2.3 g/dL (ref 1.5–4.5)
Glucose: 91 mg/dL (ref 65–99)
Potassium: 5.4 mmol/L — ABNORMAL HIGH (ref 3.5–5.2)
Sodium: 143 mmol/L (ref 134–144)
Total Bilirubin: 0.3 mg/dL (ref 0.0–1.2)
Total Protein: 7.1 g/dL (ref 6.0–8.5)

## 2013-05-22 LAB — VITAMIN B12: Vitamin B-12: 558 pg/mL (ref 211–946)

## 2013-05-29 ENCOUNTER — Institutional Professional Consult (permissible substitution): Payer: Medicare Other | Admitting: Internal Medicine

## 2013-06-04 ENCOUNTER — Ambulatory Visit (INDEPENDENT_AMBULATORY_CARE_PROVIDER_SITE_OTHER): Payer: Medicare Other | Admitting: Family Medicine

## 2013-06-04 ENCOUNTER — Encounter: Payer: Self-pay | Admitting: Family Medicine

## 2013-06-04 VITALS — BP 110/79 | HR 74 | Temp 98.1°F | Ht 72.0 in | Wt 211.6 lb

## 2013-06-04 DIAGNOSIS — J329 Chronic sinusitis, unspecified: Secondary | ICD-10-CM | POA: Diagnosis not present

## 2013-06-04 LAB — POCT CBC
Granulocyte percent: 70.9 % (ref 37–80)
HCT, POC: 48.3 % (ref 43.5–53.7)
Hemoglobin: 15.7 g/dL (ref 14.1–18.1)
Lymph, poc: 3.3 (ref 0.6–3.4)
MCH, POC: 31.5 pg — AB (ref 27–31.2)
MCHC: 32.5 g/dL (ref 31.8–35.4)
MCV: 96.8 fL (ref 80–97)
MPV: 8.4 fL (ref 0–99.8)
POC Granulocyte: 9.6 — AB (ref 2–6.9)
POC LYMPH PERCENT: 24.4 % (ref 10–50)
Platelet Count, POC: 255 K/uL (ref 142–424)
RBC: 5 M/uL (ref 4.69–6.13)
RDW, POC: 13.9 %
WBC: 13.5 K/uL — AB (ref 4.6–10.2)

## 2013-06-04 MED ORDER — AMOXICILLIN 500 MG PO TABS: ORAL_TABLET | ORAL | Status: DC

## 2013-06-04 NOTE — Progress Notes (Signed)
Subjective:    Patient ID: Ian Grimes, male    DOB: 1970-05-05, 43 y.o.   MRN: [id number]  HPI Patient is here for follow up.  He has hx of pulmonary embolism and pulmonary nodule. He Is on warfarin.  He has been feeling tired and has been having sinus congestion and discharge. He has been referred to pulmonary and missed the appointment because he felt sick.  He Has had recent labs showing leukocytosis and elevated potassium.   Review of Systems    No chest pain, SOB, HA, dizziness, vision change, N/V, diarrhea, constipation, dysuria, urinary urgency or frequency, myalgias, arthralgias or rash.  Objective:   Physical Exam Vital signs noted  Well developed well nourished male.  HEENT - Head atraumatic Normocephalic                Eyes - PERRLA, Conjuctiva - clear Sclera- Clear EOMI                Ears - EAC's Wnl TM's Wnl Gross Hearing WNL                Nose - Nares patent                 Throat - oropharanx wnl Respiratory - Lungs CTA bilateral Cardiac - RRR S1 and S2 without murmur GI - Abdomen soft Nontender and bowel sounds active x 4 Extremities - No edema. Neuro - Grossly intact.       Assessment & Plan:  Sinusitis - Plan: amoxicillin (AMOXIL) 500 MG tablet, POCT CBC, BMP8+EGFR Push po fluids, rest, tylenol and motrin otc prn as directed for fever, arthralgias, and myalgias.  Follow up prn if sx's continue or persist.  Pulmonary Nodule - Explained he needs to call and reschedule the appointment with pulmonary.  Lysbeth Penner FNP

## 2013-06-05 ENCOUNTER — Telehealth: Payer: Self-pay | Admitting: Family Medicine

## 2013-06-05 DIAGNOSIS — R911 Solitary pulmonary nodule: Secondary | ICD-10-CM

## 2013-06-05 DIAGNOSIS — I2699 Other pulmonary embolism without acute cor pulmonale: Secondary | ICD-10-CM

## 2013-06-05 DIAGNOSIS — R0602 Shortness of breath: Secondary | ICD-10-CM

## 2013-06-05 LAB — BMP8+EGFR
BUN/Creatinine Ratio: 10 (ref 9–20)
BUN: 10 mg/dL (ref 6–24)
CO2: 21 mmol/L (ref 18–29)
Calcium: 9.5 mg/dL (ref 8.7–10.2)
Chloride: 105 mmol/L (ref 97–108)
Creatinine, Ser: 0.96 mg/dL (ref 0.76–1.27)
GFR calc Af Amer: 112 mL/min/{1.73_m2} (ref >59)
GFR calc non Af Amer: 97 mL/min/{1.73_m2} (ref >59)
Glucose: 84 mg/dL (ref 65–99)
Potassium: 4.3 mmol/L (ref 3.5–5.2)
Sodium: 144 mmol/L (ref 134–144)

## 2013-06-05 NOTE — Telephone Encounter (Signed)
Patient has not seen a pulmonologist. He was referred but missed the appointment.  Referral placed and patient aware that he should hear something within a week.

## 2013-06-06 DIAGNOSIS — G44209 Tension-type headache, unspecified, not intractable: Secondary | ICD-10-CM | POA: Diagnosis not present

## 2013-06-12 ENCOUNTER — Ambulatory Visit (INDEPENDENT_AMBULATORY_CARE_PROVIDER_SITE_OTHER): Payer: Medicare Other | Admitting: Internal Medicine

## 2013-06-12 ENCOUNTER — Encounter: Payer: Self-pay | Admitting: Internal Medicine

## 2013-06-12 VITALS — BP 128/68 | HR 84 | Ht 72.0 in | Wt 216.0 lb

## 2013-06-12 DIAGNOSIS — F17200 Nicotine dependence, unspecified, uncomplicated: Secondary | ICD-10-CM

## 2013-06-12 DIAGNOSIS — Z720 Tobacco use: Secondary | ICD-10-CM

## 2013-06-12 DIAGNOSIS — R911 Solitary pulmonary nodule: Secondary | ICD-10-CM | POA: Diagnosis not present

## 2013-06-12 DIAGNOSIS — I2699 Other pulmonary embolism without acute cor pulmonale: Secondary | ICD-10-CM

## 2013-06-12 MED ORDER — RIVAROXABAN 20 MG PO TABS: 20.0000 mg | ORAL_TABLET | Freq: Every day | ORAL | Status: DC

## 2013-06-12 NOTE — Progress Notes (Signed)
Subjective:    Patient ID: Ian Grimes, male    DOB: 04/28/1970, 43 y.o.   MRN: [id number] PCP Lysbeth Penner, FNP  HPI  IOV 06/12/2013  Chief Complaint  Patient presents with  . Advice Only    Referred for pulmonary nodule.  Had CT chest at the end of March.  Has no breathing complaints at this time.    43 year old smoker with significant psychiatric history but a fairly good historian also with anxiety, hyperlipidemia and smoking. He was admitted 05/06/2013 with 3 week history of dyspnea. Was diagnosed with bilateral pulmonary embolism. Treated by the hospitalist service. He was on 2 L nasal cannula with a respirator 30 per minute at the time of admission. He was discharged 05/08/2013 on xarelto. Since then he has returned to baseline health. He is followed up with his primary care physician. He says that for the last 1 week he is not taking his xarelto because his primary care physician apparently told him that the refills are actually once a month. He is also confused that his primary care physician has been regarding that he is on Coumadin but he insists that he wants on xarelto up until one week ago and that there was no change in plan to take any other medication. He had tolerated xarelto well without any problems of bleeding or compliance issues.  CT scan of the chest at that time should 8 mm subpleural nodule in the lateral segment of the right middle lobe and that is the main reason for referral today  Other she smoking: He continues to smoke and is unable to quit.  reports that he has been smoking Cigarettes.  He has a 22 pack-year smoking history. He has never used smokeless tobacco.  IMPRESSION:   CT chest 05/06/13 Segmental pulmonary emboli within all lobes. Overall clot burden is  moderate.  8 x 6 mm triangular subpleural nodule in the lateral right middle  lobe, corresponding to the radiographic abnormality, favored to  reflect a benign subpleural lymph node but  technically  indeterminate. Given patient's high risk for primary bronchogenic  neoplasm, initial follow-up CT chest is suggested in 6 months.  Faint ground-glass nodular opacities in the left upper lobe/lingula,  possibly infectious/inflammatory.  Trace right pleural effusion.  Nodule recommendation follows the consensus statement: Guidelines  for Management of Small Pulmonary Nodules Detected on CT Scans: A  Statement from the [place] as published in Radiology  2005; 237:395-400.  Critical value/emergent results were called by telephone at the time  of interpretation on 05/06/2013 at 9:12 AM to [facility], who  verbally acknowledged these results.  Electronically Signed  By: Julian Hy M.D.  On: 05/06/2013 09:35         Past Medical History  Diagnosis Date  . Schizo-affective psychosis   . Anxiety   . Depression   . HA (headache)   . Hyperlipidemia   . Pulmonary emboli   . Pulmonary nodule     follow up CT 6 months (due 9/15)  . Pleural effusion      Family History  Problem Relation Age of Onset  . Heart disease Father   . Stroke Father   . COPD Paternal 28   . Asthma Paternal Aunt   . Stroke Paternal Uncle   . Cancer Father     lung     History   Social History  . Marital Status: Legally Separated    Spouse Name: N/A    Number  of Children: N/A  . Years of Education: N/A   Occupational History  . Not on file.   Social History Main Topics  . Smoking status: Current Every Day Smoker -- 1.00 packs/day for 22 years    Types: Cigarettes  . Smokeless tobacco: Never Used  . Alcohol Use: Yes     Comment: drinks 1 can of beer/month.    . Drug Use: No  . Sexual Activity: Yes   Other Topics Concern  . Not on file   Social History Narrative  . No narrative on file     Allergies  Allergen Reactions  . Bactrim Rash     Outpatient Prescriptions Prior to Visit  Medication Sig Dispense Refill  . clonazePAM (KLONOPIN) 1 MG tablet  Take 1 mg by mouth 3 (three) times daily.       Marland Kitchen ezetimibe (ZETIA) 10 MG tablet Take 1 tablet (10 mg total) by mouth daily.  90 tablet  3  . fenofibrate micronized (ANTARA) 130 MG capsule Take 1 capsule (130 mg total) by mouth daily before breakfast.  30 capsule  5  . ibuprofen (ADVIL,MOTRIN) 200 MG tablet Take 800 mg by mouth 2 (two) times daily as needed. For pain      . pravastatin (PRAVACHOL) 40 MG tablet Take 1 tablet (40 mg total) by mouth daily.  90 tablet  3  . sertraline (ZOLOFT) 100 MG tablet Take 1 tablet (100 mg total) by mouth 2 (two) times daily.  30 tablet  0  . traZODone (DESYREL) 100 MG tablet Take 100 mg by mouth at bedtime.       Marland Kitchen amoxicillin (AMOXIL) 500 MG tablet 2 po bid x 10 days  40 tablet  0  . Rivaroxaban (XARELTO) 15 MG TABS tablet Take 1 tablet (15 mg total) by mouth 2 (two) times daily with a meal.  42 tablet  0  . Rivaroxaban (XARELTO) 20 MG TABS tablet Take 1 tablet (20 mg total) by mouth daily with supper.  30 tablet  1   No facility-administered medications prior to visit.       Review of Systems  Constitutional: Negative for fever and unexpected weight change.  HENT: Positive for postnasal drip and sinus pressure. Negative for congestion, dental problem, ear pain, nosebleeds, rhinorrhea, sneezing, sore throat and trouble swallowing.   Eyes: Negative for redness and itching.  Respiratory: Negative for cough, chest tightness, shortness of breath and wheezing.   Cardiovascular: Negative for palpitations and leg swelling.  Gastrointestinal: Negative for nausea and vomiting.  Genitourinary: Negative for dysuria.  Musculoskeletal: Negative for joint swelling.  Skin: Negative for rash.  Neurological: Positive for headaches.  Hematological: Does not bruise/bleed easily.  Psychiatric/Behavioral: Negative for dysphoric mood. The patient is nervous/anxious.        Objective:   Physical Exam  Nursing note and vitals reviewed. Constitutional: He is oriented  to person, place, and time. He appears well-developed and well-nourished. No distress.  HENT:  Head: Normocephalic and atraumatic.  Right Ear: External ear normal.  Left Ear: External ear normal.  Mouth/Throat: Oropharynx is clear and moist. No oropharyngeal exudate.  Smell is of tobacco  Eyes: Conjunctivae and EOM are normal. Pupils are equal, round, and reactive to light. Right eye exhibits no discharge. Left eye exhibits no discharge. No scleral icterus.  Neck: Normal range of motion. Neck supple. No JVD present. No tracheal deviation present. No thyromegaly present.  Cardiovascular: Normal rate, regular rhythm and intact distal pulses.  Exam reveals no  gallop and no friction rub.   No murmur heard. Pulmonary/Chest: Effort normal and breath sounds normal. No respiratory distress. He has no wheezes. He has no rales. He exhibits no tenderness.  Abdominal: Soft. Bowel sounds are normal. He exhibits no distension and no mass. There is no tenderness. There is no rebound and no guarding.  Musculoskeletal: Normal range of motion. He exhibits no edema and no tenderness.  Lymphadenopathy:    He has no cervical adenopathy.  Neurological: He is alert and oriented to person, place, and time. He has normal reflexes. No cranial nerve deficit. Coordination normal.  Skin: Skin is warm and dry. No rash noted. He is not diaphoretic. No erythema. No pallor.  Tattoo of the word OZZY and one Swastika on his knuckles  Psychiatric: He has a normal mood and affect. His behavior is normal. Judgment and thought content normal.          Assessment & Plan:

## 2013-06-12 NOTE — Patient Instructions (Addendum)
#  Lung nodule -49mm in Right Middle Lobe lateral segment - low-intermediate probability for lung cancer; could be inflammation related to pulmonary embolism  - repeat CT chest wo contrast 3 months from 05/06/13   #Pulmonary embolism  - you should be on anti-coagulation for a long time; 6 months to a few years - please restart xarelto - dose 20mg  once daily with food, do not skip dose  - report bleeding issues immediately - at followup will evaluate if he is on CYP inhibitors (Strong ones) that will increase his xarelto levels  #smoking  - try to quit please  #followup  - mid-June 2015 after ct chest

## 2013-06-16 ENCOUNTER — Telehealth: Payer: Self-pay | Admitting: Internal Medicine

## 2013-06-16 DIAGNOSIS — R911 Solitary pulmonary nodule: Secondary | ICD-10-CM | POA: Insufficient documentation

## 2013-06-16 NOTE — Assessment & Plan Note (Signed)
#  Lung nodule -71mm in Right Middle Lobe lateral segment - low-intermediate probability for lung cancer; could be inflammation related to pulmonary embolism  - repeat CT chest wo contrast 3 months from 05/06/13  #followup  - mid-June 2015 after ct chest

## 2013-06-16 NOTE — Telephone Encounter (Signed)
Hi Erika  This patient is on xarelto, I read recently that any strong cyp inhibitor in liver will increase xarelto levels. I have heard sometimes some of psych meds can be. Do you see any dangerous drug interations of this patient on xarelto   Thanks  Dr. Brand Males, M.D., [facility].C.P Pulmonary and Critical Care Medicine Staff Physician [place] [facility] Pager: [phone], If no answer or between  15:00h - 7:00h: call [phone]  06/16/2013 6:06 PM

## 2013-06-16 NOTE — Assessment & Plan Note (Signed)
Advised to quit but I Suspect can be hard with his schizo-affective disorder

## 2013-06-16 NOTE — Assessment & Plan Note (Addendum)
#  Pulmonary embolism  - you should be on anti-coagulation for a long time; 6 months to a few years - please restart xarelto - dose 20mg  once daily with food, do not skip dose  - report bleeding issues immediately - at followup will investigae for drug interactions with strong cyp inhibitors

## 2013-06-20 NOTE — Telephone Encounter (Signed)
Hey Dr Chase Caller,  I reviewed pt's current medication list and it does not appear pt is currently on any medications that have a dangerous drug interaction with Xarelto.  Zoloft does have a slight interaction given it's platelet inhibition, but not a strong reason to avoid taking Xarelto while on Zoloft.  Pt should just be made aware to monitor for any signs and symptoms of bleeding as usual with taking any anticoagulation drugs.  The major strong CYP3A4 inhibitors/inducers such as ketoconazole, traconazole, ritonavir, carbamazepine, phenytoin, rifampin, St. John's wort, are the drugs we want to avoid using Xarelto in combination with. Hope this answers your questions.

## 2013-06-20 NOTE — Telephone Encounter (Signed)
Called made pt aware. Nothing further needed 

## 2013-06-20 NOTE — Telephone Encounter (Signed)
Ian Grimes  Please let patient know to continue xarelto and we checked for drug itneractcions and appears safe for him to take xarelto  PS: Thanks a lot Erika  Dr. Brand Males, M.D., [facility].C.P Pulmonary and Critical Care Medicine Staff Physician [place] [facility] Pager: [phone], If no answer or between  15:00h - 7:00h: call [phone]  06/20/2013 9:37 AM

## 2013-07-05 ENCOUNTER — Other Ambulatory Visit: Payer: Self-pay | Admitting: Nurse Practitioner

## 2013-07-08 ENCOUNTER — Other Ambulatory Visit: Payer: Self-pay | Admitting: Nurse Practitioner

## 2013-07-09 ENCOUNTER — Other Ambulatory Visit: Payer: Self-pay | Admitting: Nurse Practitioner

## 2013-07-09 ENCOUNTER — Ambulatory Visit (INDEPENDENT_AMBULATORY_CARE_PROVIDER_SITE_OTHER): Payer: Medicare Other | Admitting: Family Medicine

## 2013-07-09 ENCOUNTER — Encounter: Payer: Self-pay | Admitting: Family Medicine

## 2013-07-09 VITALS — BP 106/78 | HR 91 | Temp 98.1°F | Ht 72.0 in | Wt 212.2 lb

## 2013-07-09 DIAGNOSIS — E785 Hyperlipidemia, unspecified: Secondary | ICD-10-CM | POA: Diagnosis not present

## 2013-07-09 MED ORDER — EZETIMIBE 10 MG PO TABS: ORAL_TABLET | ORAL | Status: DC

## 2013-07-09 MED ORDER — FENOFIBRATE MICRONIZED 130 MG PO CAPS: 130.0000 mg | ORAL_CAPSULE | Freq: Every day | ORAL | Status: DC

## 2013-07-09 NOTE — Progress Notes (Signed)
   Subjective:    Patient ID: Orson Ape Dorff, male    DOB: 17-Mar-1970, 43 y.o.   MRN: [id number]  HPI  This 43 y.o. male presents for evaluation of hyperlipidemia.  He needs refills on zetia and  fexofibrate.  Review of Systems    No chest pain, SOB, HA, dizziness, vision change, N/V, diarrhea, constipation, dysuria, urinary urgency or frequency, myalgias, arthralgias or rash.  Objective:   Physical Exam  Vital signs noted  Well developed well nourished male.  HEENT - Head atraumatic Normocephalic                Eyes - PERRLA, Conjuctiva - clear Sclera- Clear EOMI                Ears - EAC's Wnl TM's Wnl Gross Hearing WNL                Nose - Nares patent                 Throat - oropharanx wnl Respiratory - Lungs CTA bilateral Cardiac - RRR S1 and S2 without murmur GI - Abdomen soft Nontender and bowel sounds active x 4 Extremities - No edema. Neuro - Grossly intact.      Assessment & Plan:  Other and unspecified hyperlipidemia - Plan: ezetimibe (ZETIA) 10 MG tablet, fenofibrate micronized (ANTARA) 130 MG capsule  Lysbeth Penner FNP

## 2013-07-18 DIAGNOSIS — F259 Schizoaffective disorder, unspecified: Secondary | ICD-10-CM | POA: Diagnosis not present

## 2013-08-07 ENCOUNTER — Ambulatory Visit (INDEPENDENT_AMBULATORY_CARE_PROVIDER_SITE_OTHER)
Admission: RE | Admit: 2013-08-07 | Discharge: 2013-08-07 | Disposition: A | Discharge status: Home / Self Care | Payer: Medicare Other | Source: Ambulatory Visit | Attending: Internal Medicine | Admitting: Internal Medicine

## 2013-08-07 ENCOUNTER — Telehealth: Payer: Self-pay | Admitting: Internal Medicine

## 2013-08-07 DIAGNOSIS — R911 Solitary pulmonary nodule: Secondary | ICD-10-CM | POA: Diagnosis not present

## 2013-08-07 DIAGNOSIS — J438 Other emphysema: Secondary | ICD-10-CM | POA: Diagnosis not present

## 2013-08-07 NOTE — Telephone Encounter (Signed)
Spoke with patient-he is aware that MR is back in the office today and will forward message to him to advise on results as patient is eager to know asap.

## 2013-08-08 NOTE — Telephone Encounter (Signed)
  Let him know nodule has resolved and I will discuss at upcoming followup. Regarding aneurysm - no need to mention. He is very anxious; wll d/w him at fu  Dr. Brand Males, M.D., [facility].C.P Pulmonary and Critical Care Medicine Staff Physician [place] [facility] Pager: [phone], If no answer or between  15:00h - 7:00h: call [phone]  08/08/2013 3:59 AM      Ct Chest Wo Contrast  08/07/2013   CLINICAL DATA:  Follow up pulmonary nodules demonstrated on prior CT. Smoker. No history of malignancy.  EXAM: CT CHEST WITHOUT CONTRAST  TECHNIQUE: Multidetector CT imaging of the chest was performed following the standard protocol without IV contrast.  COMPARISON:  Radiographs 04/30/2013.  CT 05/06/2013.  FINDINGS: There are stable small mediastinal and hilar lymph nodes. Allowing for the limitations of noncontrast technique, no enlarged mediastinal, hilar or axillary lymph nodes are identified.  The ascending aorta is dilated to 4.9 cm. No displaced intimal calcifications are identified. There is no evidence of mediastinal hematoma, and only minimal atherosclerotic calcification is present.  The heart size is normal. There is no pleural or pericardial effusion.  The lungs demonstrate mild centrilobular and paraseptal emphysema. Previously demonstrated small nodule near the right costophrenic angle has resolved, likely representing atelectasis. There is no residual ground-glass density. Minimal subpleural scarring is present in the left lower lobe on image 37. There are no suspicious pulmonary nodules.  There are no suspicious osseous findings. There is a mild scoliosis. The visualized upper abdomen appears unremarkable.  IMPRESSION: 1. No suspicious pulmonary nodules. Subpleural density previously noted at the right lung base has resolved, likely atelectasis or sequela of pulmonary embolism demonstrated on prior examination. 2. Mild centrilobular and paraseptal  emphysema. 3. Ascending aortic aneurysm appears mildly enlarged compared with the prior study. Of note, previously demonstrated pulmonary embolism is not addressed on this noncontrast study.   Electronically Signed   By: Camie Patience M.D.   On: 08/07/2013 12:30

## 2013-08-08 NOTE — Telephone Encounter (Signed)
I spoke with patient about results and he verbalized understanding and had no questions 

## 2013-08-19 ENCOUNTER — Encounter: Payer: Self-pay | Admitting: Internal Medicine

## 2013-08-19 ENCOUNTER — Ambulatory Visit (INDEPENDENT_AMBULATORY_CARE_PROVIDER_SITE_OTHER): Payer: Medicare Other | Admitting: Internal Medicine

## 2013-08-19 VITALS — BP 118/80 | HR 91 | Ht 72.0 in | Wt 210.2 lb

## 2013-08-19 DIAGNOSIS — R911 Solitary pulmonary nodule: Secondary | ICD-10-CM | POA: Diagnosis not present

## 2013-08-19 DIAGNOSIS — Z720 Tobacco use: Secondary | ICD-10-CM

## 2013-08-19 DIAGNOSIS — F17200 Nicotine dependence, unspecified, uncomplicated: Secondary | ICD-10-CM | POA: Diagnosis not present

## 2013-08-19 DIAGNOSIS — R918 Other nonspecific abnormal finding of lung field: Secondary | ICD-10-CM

## 2013-08-19 DIAGNOSIS — I712 Thoracic aortic aneurysm, without rupture, unspecified: Secondary | ICD-10-CM

## 2013-08-19 DIAGNOSIS — I2699 Other pulmonary embolism without acute cor pulmonale: Secondary | ICD-10-CM | POA: Diagnosis not present

## 2013-08-19 DIAGNOSIS — I7121 Aneurysm of the ascending aorta, without rupture: Secondary | ICD-10-CM

## 2013-08-19 NOTE — Assessment & Plan Note (Signed)
#  smoking  - try to quit please

## 2013-08-19 NOTE — Assessment & Plan Note (Signed)
#  Lung nodule -78mm in Right Middle Lobe lateral segment 05/06/13 -- resolved CT chest June 2015  - no further followup -  - repeat CT chest wo contrast 3 months from 05/06/13

## 2013-08-19 NOTE — Patient Instructions (Addendum)
#  Lung nodule -60mm in Right Middle Lobe lateral segment March 2015  - resolved on CT June 2015  - no further followup    #Pulmonary embolism  - you should be on anti-coagulation for a long time; 6 months to a few years from march 2015 - continue  xarelto - dose 20mg  once daily with food, do not skip dose 0- will ensure refills through April 2016  #smoking  - try to quit please   #Aortic aneurysm - ascending aorta 4.5cm CT June 2015  - refer thoracic surgery  #Followup - mid-march 2016 to decide on continuation of xarelto; will get d-dimer at that time

## 2013-08-19 NOTE — Progress Notes (Signed)
Subjective:    Patient ID: Ian Grimes, male    DOB: 10-Aug-1970, 43 y.o.   MRN: [id number]  HPI    IOV 06/12/2013  Chief Complaint  Patient presents with  . Advice Only    Referred for pulmonary nodule.  Had CT chest at the end of March.  Has no breathing complaints at this time.    43 year old smoker with significant psychiatric history but a fairly good historian also with anxiety, hyperlipidemia and smoking. He was admitted 05/06/2013 with 3 week history of dyspnea. Was diagnosed with bilateral pulmonary embolism. Treated by the hospitalist service. He was on 2 L nasal cannula with a respirator 30 per minute at the time of admission. He was discharged 05/08/2013 on xarelto. Since then he has returned to baseline health. He is followed up with his primary care physician. He says that for the last 1 week he is not taking his xarelto because his primary care physician apparently told him that the refills are actually once a month. He is also confused that his primary care physician has been regarding that he is on Coumadin but he insists that he wants on xarelto up until one week ago and that there was no change in plan to take any other medication. He had tolerated xarelto well without any problems of bleeding or compliance issues.  CT scan of the chest at that time should 8 mm subpleural nodule in the lateral segment of the right middle lobe and that is the main reason for referral today  Other she smoking: He continues to smoke and is unable to quit.  reports that he has been smoking Cigarettes.  He has a 22 pack-year smoking history. He has never used smokeless tobacco.  IMPRESSION:   CT chest 05/06/13 Segmental pulmonary emboli within all lobes. Overall clot burden is  moderate.  8 x 6 mm triangular subpleural nodule in the lateral right middle  lobe, corresponding to the radiographic abnormality, favored to  reflect a benign subpleural lymph node but technically  indeterminate.  Given patient's high risk for primary bronchogenic  neoplasm, initial follow-up CT chest is suggested in 6 months.  Faint ground-glass nodular opacities in the left upper lobe/lingula,  possibly infectious/inflammatory.  Trace right pleural effusion.  Nodule recommendation follows the consensus statement: Guidelines  for Management of Small Pulmonary Nodules Detected on CT Scans: A  Statement from the [place] as published in Radiology  2005; 237:395-400.  Critical value/emergent results were called by telephone at the time  of interpretation on 05/06/2013 at 9:12 AM to [facility], who  verbally acknowledged these results.  Electronically Signed  By: Julian Hy M.D.  On: 05/06/2013 09:35         OV 08/19/2013  Chief Complaint  Patient presents with  . Follow-up    Pt states breathing is unchanged. Pt states he only coughs to clear his throat. Pt states he constant sinus headaches. Pt denies SOB and CP.     Folllowup   Smoking: still smokes. Struggling to q uit. Depression makes it harder to quit  PE: back on xaretlo 20mg  per day after med review. He has no problems. MOwed yard 4h and no dyspnea   Lung nodule: has resolved June 2015 CT chest but new finding: Ascending Aorta Aneurumsm 4.5cm on CT chest   IMPRESSION:  1. No suspicious pulmonary nodules. Subpleural density previously  noted at the right lung base has resolved, likely atelectasis or  sequela of pulmonary embolism demonstrated  on prior examination.  2. Mild centrilobular and paraseptal emphysema.  3. Ascending aortic aneurysm appears mildly enlarged compared with  the prior study. Of note, previously demonstrated pulmonary embolism  is not addressed on this noncontrast study.  Electronically Signed  By: Camie Patience M.D.  On: 08/07/2013 12:30    Review of Systems  Constitutional: Negative for fever and unexpected weight change.  HENT: Positive for postnasal drip and sinus pressure.  Negative for congestion, dental problem, ear pain, nosebleeds, rhinorrhea, sneezing, sore throat and trouble swallowing.   Eyes: Negative for redness and itching.  Respiratory: Positive for cough. Negative for chest tightness, shortness of breath and wheezing.   Cardiovascular: Negative for palpitations and leg swelling.  Gastrointestinal: Positive for vomiting. Negative for nausea.  Genitourinary: Negative for dysuria.  Musculoskeletal: Negative for joint swelling.  Skin: Negative for rash.  Neurological: Positive for headaches.  Hematological: Does not bruise/bleed easily.  Psychiatric/Behavioral: Negative for dysphoric mood. The patient is not nervous/anxious.        Objective:   Physical Exam Filed Vitals:   08/19/13 1438  BP: 118/80  Pulse: 91  Height: 6' (1.829 m)  Weight: 210 lb 3.2 oz (95.346 kg)  SpO2: 98%    Nursing note and vitals reviewed. Constitutional: He is oriented to person, place, and time. He appears well-developed and well-nourished. No distress.  HENT:  Head: Normocephalic and atraumatic.  Right Ear: External ear normal.  Left Ear: External ear normal.  Mouth/Throat: Oropharynx is clear and moist. No oropharyngeal exudate.   Eyes: Conjunctivae and EOM are normal. Pupils are equal, round, and reactive to light. Right eye exhibits no discharge. Left eye exhibits no discharge. No scleral icterus.  Neck: Normal range of motion. Neck supple. No JVD present. No tracheal deviation present. No thyromegaly present.  Cardiovascular: Normal rate, regular rhythm and intact distal pulses.  Exam reveals no gallop and no friction rub.   No murmur heard. Pulmonary/Chest: Effort normal and breath sounds normal. No respiratory distress. He has no wheezes. He has no rales. He exhibits no tenderness.  Abdominal: Soft. Bowel sounds are normal. He exhibits no distension and no mass. There is no tenderness. There is no rebound and no guarding.  Musculoskeletal: Normal range of  motion. He exhibits no edema and no tenderness.  Lymphadenopathy:    He has no cervical adenopathy.  Neurological: He is alert and oriented to person, place, and time. He has normal reflexes. No cranial nerve deficit. Coordination normal.  Skin: Skin is warm and dry. No rash noted. He is not diaphoretic. No erythema. No pallor.  Tattoo of the word OZZY on knuckles and Swastika on left distal forearm  Psychiatric: He has a normal mood and affect. His behavior is normal. Judgment and thought content normal.          Assessment & Plan:  #Lung nodule -70mm in Right Middle Lobe lateral segment March 2015  - resolved on CT June 2015  - no further followup    #Pulmonary embolism  - you should be on anti-coagulation for a long time; 6 months to a few years from march 2015 - continue  xarelto - dose 20mg  once daily with food, do not skip dose 0- will ensure refills through April 2016  #smoking  - try to quit please  #Aortic aneurysm - ascending aorta 4.5cm CT June 2015  - refer thoracic surgery  #Followup  - mid-march 2016 to decide on continuation of xarelto; will get d-dimer at that time

## 2013-08-20 ENCOUNTER — Telehealth: Payer: Self-pay | Admitting: Family Medicine

## 2013-08-20 NOTE — Telephone Encounter (Signed)
Appointment made for 6/24 per bill to go over CT patients appointment at 10

## 2013-08-20 NOTE — Telephone Encounter (Signed)
Spoke with patient and advised him that he should contact the pulmonologist and let him advise on activity. He states that he called them this am and that the nurse would not tell him any info and told him that he can find our everything he needs to know at his follow up appt in July. Asked that a message would be sent to you and see if you could help him out. Please advise

## 2013-08-20 NOTE — Telephone Encounter (Signed)
Please call patient today and have him follow up with me on Wednesday to discuss his recent CT and his recent Pulmonary consult

## 2013-08-21 ENCOUNTER — Encounter: Payer: Self-pay | Admitting: Family Medicine

## 2013-08-21 ENCOUNTER — Ambulatory Visit (INDEPENDENT_AMBULATORY_CARE_PROVIDER_SITE_OTHER): Payer: Medicare Other | Admitting: Family Medicine

## 2013-08-21 VITALS — BP 102/71 | HR 75 | Temp 99.3°F | Ht 72.0 in | Wt 208.6 lb

## 2013-08-21 DIAGNOSIS — I712 Thoracic aortic aneurysm, without rupture, unspecified: Secondary | ICD-10-CM | POA: Diagnosis not present

## 2013-08-21 DIAGNOSIS — I7121 Aneurysm of the ascending aorta, without rupture: Secondary | ICD-10-CM

## 2013-08-21 NOTE — Progress Notes (Signed)
   Subjective:    Patient ID: Ian Grimes, male    DOB: 08-05-1970, 43 y.o.   MRN: [id number]  HPI This 43 y.o. male presents for evaluation of ascending abdominal aortic aneurysm. He has hx of pulmonary embolism and pulmonary nodule and was referred to Pulmonary and  A recent CT of the chest revealed 4.8 cm ascending aortic aneurysm.  He has been referred to Vascular and has an appointment in a couple weeks.  He is taking xarelto 20mg  po qd.  He has  Been given refills per Pulmonary.  He has questions about the aneurysm.  He has hx of psychiatric illness and is concerned about what the aneurysm means and what activity can he do.   Review of Systems No chest pain, SOB, HA, dizziness, vision change, N/V, diarrhea, constipation, dysuria, urinary urgency or frequency, myalgias, arthralgias or rash.     Objective:   Physical Exam  Vital signs noted  Well developed well nourished male.  HEENT - Head atraumatic Normocephalic                Eyes - PERRLA, Conjuctiva - clear Sclera- Clear EOMI                Ears - EAC's Wnl TM's Wnl Gross Hearing WNL                Throat - oropharanx wnl Respiratory - Lungs CTA bilateral Cardiac - RRR S1 and S2 without murmur GI - Abdomen soft Nontender and bowel sounds active x 4 Extremities - No edema. Neuro - Grossly intact.      Assessment & Plan:  Ascending aortic aneurysm Discussed with patient to continue with current level of activity and explained that he needs to follow  Up with vascular surgeon with scheduled appointment by Pulmonary.  Pulmonary Nodule - Follow up with Pulmonary  Tobacco abuse - Discontinue smoking  Pulmonary Embolism - Continue with xarelto 20mg  po qd.  Explained he needs to take this daily and  Not run out and he agrees  Lysbeth Penner FNP

## 2013-08-25 NOTE — Assessment & Plan Note (Signed)
#  Pulmonary embolism  - you should be on anti-coagulation for a long time; 6 months to a few years from march 2015 - continue  xarelto - dose 20mg  once daily with food, do not skip dose 0- will ensure refills through April 2016

## 2013-08-25 NOTE — Assessment & Plan Note (Signed)
#  Lung nodule -34mm in Right Middle Lobe lateral segment March 2015  - resolved on CT June 2015  - no further followup

## 2013-08-25 NOTE — Assessment & Plan Note (Signed)
#  Aortic aneurysm - ascending aorta 4.5cm CT June 2015  - refer thoracic surgery

## 2013-09-04 ENCOUNTER — Institutional Professional Consult (permissible substitution) (INDEPENDENT_AMBULATORY_CARE_PROVIDER_SITE_OTHER): Payer: Medicare Other | Admitting: Surgery

## 2013-09-04 ENCOUNTER — Encounter: Payer: Self-pay | Admitting: Surgery

## 2013-09-04 ENCOUNTER — Other Ambulatory Visit: Payer: Self-pay | Admitting: *Deleted

## 2013-09-04 VITALS — BP 125/88 | HR 100 | Resp 20 | Ht 72.0 in | Wt 210.0 lb

## 2013-09-04 DIAGNOSIS — I712 Thoracic aortic aneurysm, without rupture, unspecified: Secondary | ICD-10-CM | POA: Diagnosis not present

## 2013-09-04 DIAGNOSIS — I7121 Aneurysm of the ascending aorta, without rupture: Secondary | ICD-10-CM

## 2013-09-06 ENCOUNTER — Encounter: Payer: Self-pay | Admitting: Surgery

## 2013-09-06 NOTE — Progress Notes (Signed)
Cardiothoracic Surgery Consultation   PCP is Redge Gainer, MD Referring Provider is Brand Males, MD  Chief Complaint  Patient presents with  . Thoracic Aortic Aneurysm    Surgical eval, Chest CT 08/07/13    HPI:  The patient is a 43 year old smoker with a history of Schizo-affective disorder on disability for mental health who presented in early March 2015 with a few week history of shortness of breath. He had a chest xray showing a small nodule. He was treated with prednisone and amoxicillin without improvement and a week later had a CT of the chest which showed bilateral pulmonary emboli. It also showed a 9mm x 6 mm triangular subpleural nodule in the lateral right middle lobe corresponding to the nodule seen on CXR. He was admitted and started on heparin and converted to Xarelto which he is still taking. He had a follow up CT scan of the chest on 08/07/2013 that showed resolution of the pulmonary nodule. He did not have any contrast so no evaluation of the pulmonary emboli could be made. It was noted that he had fusiform dilatation of the ascending aorta to 4.9 cm. In retrospect this was present on the CTA from March but was not commented on. It appeared that it may have enlarged slightly compared to the scan in March.  Past Medical History  Diagnosis Date  . Schizo-affective psychosis   . Anxiety   . Depression   . HA (headache)   . Hyperlipidemia   . Pulmonary emboli   . Pulmonary nodule     follow up CT 6 months (due 9/15)  . Pleural effusion     Past Surgical History  Procedure Laterality Date  . Rib injury      Family History  Problem Relation Age of Onset  . Heart disease Father   . Stroke Father   . COPD Paternal 11   . Asthma Paternal Aunt   . Stroke Paternal Uncle   . Cancer Father     lung    Social History History  Substance Use Topics  . Smoking status: Current Every Day Smoker -- 1.00 packs/day for 22 years    Types: Cigarettes  . Smokeless  tobacco: Never Used  . Alcohol Use: Yes     Comment: drinks 1 can of beer/month.      Current Outpatient Prescriptions  Medication Sig Dispense Refill  . clonazePAM (KLONOPIN) 1 MG tablet Take 1 mg by mouth 3 (three) times daily.       Marland Kitchen ezetimibe (ZETIA) 10 MG tablet TAKE 1 TABLET ONCE A DAY  30 tablet  5  . fenofibrate micronized (ANTARA) 130 MG capsule Take 1 capsule (130 mg total) by mouth daily before breakfast.  30 capsule  3  . pravastatin (PRAVACHOL) 40 MG tablet Take 1 tablet (40 mg total) by mouth daily.  90 tablet  3  . rivaroxaban (XARELTO) 20 MG TABS tablet Take 1 tablet (20 mg total) by mouth daily with supper.  30 tablet  5  . sertraline (ZOLOFT) 100 MG tablet Take 1 tablet (100 mg total) by mouth 2 (two) times daily.  30 tablet  0  . traZODone (DESYREL) 100 MG tablet Take 100 mg by mouth at bedtime.        No current facility-administered medications for this visit.    Allergies  Allergen Reactions  . Bactrim Rash    Review of Systems  Constitutional: Negative for fever, chills, activity change and fatigue.  HENT:  Negative.        Sees his dentist every 6 months.  Eyes: Negative.   Respiratory: Negative.   Cardiovascular: Negative.   Gastrointestinal: Negative.   Endocrine: Negative.   Genitourinary: Negative.   Musculoskeletal: Negative.   Skin: Negative.   Allergic/Immunologic: Negative.   Neurological: Negative.   Hematological: Negative.   Psychiatric/Behavioral:       Schizo-affective disorder    BP 125/88  Pulse 100  Resp 20  Ht 6' (1.829 m)  Wt 210 lb (95.255 kg)  BMI 28.47 kg/m2  SpO2 96% Physical Exam  Constitutional: He is oriented to person, place, and time. He appears well-developed and well-nourished. No distress.  HENT:  Head: Normocephalic and atraumatic.  Multiple missing teeth. Extensive plaque on teeth.  Eyes: EOM are normal. Pupils are equal, round, and reactive to light.  Neck: Normal range of motion. No JVD present. No  thyromegaly present.  Cardiovascular: Normal rate, regular rhythm, normal heart sounds and intact distal pulses.   No murmur heard. Pulmonary/Chest: Effort normal and breath sounds normal. No respiratory distress. He has no rales.  Abdominal: Soft. Bowel sounds are normal. He exhibits no distension and no mass. There is no tenderness.  Musculoskeletal: He exhibits no edema and no tenderness.  Lymphadenopathy:    He has no cervical adenopathy.  Neurological: He is alert and oriented to person, place, and time. He has normal strength. No cranial nerve deficit or sensory deficit.  Skin: Skin is warm and dry.  Multiple tatoos  Psychiatric: He has a normal mood and affect.     Diagnostic Tests:  CLINICAL DATA: Follow up pulmonary nodules demonstrated on prior  CT. Smoker. No history of malignancy.  EXAM:  CT CHEST WITHOUT CONTRAST  TECHNIQUE:  Multidetector CT imaging of the chest was performed following the  standard protocol without IV contrast.  COMPARISON: Radiographs 04/30/2013. CT 05/06/2013.  FINDINGS:  There are stable small mediastinal and hilar lymph nodes. Allowing  for the limitations of noncontrast technique, no enlarged  mediastinal, hilar or axillary lymph nodes are identified.  The ascending aorta is dilated to 4.9 cm. No displaced intimal  calcifications are identified. There is no evidence of mediastinal  hematoma, and only minimal atherosclerotic calcification is present.  The heart size is normal. There is no pleural or pericardial  effusion.  The lungs demonstrate mild centrilobular and paraseptal emphysema.  Previously demonstrated small nodule near the right costophrenic  angle has resolved, likely representing atelectasis. There is no  residual ground-glass density. Minimal subpleural scarring is  present in the left lower lobe on image 37. There are no suspicious  pulmonary nodules.  There are no suspicious osseous findings. There is a mild scoliosis.  The  visualized upper abdomen appears unremarkable.  IMPRESSION:  1. No suspicious pulmonary nodules. Subpleural density previously  noted at the right lung base has resolved, likely atelectasis or  sequela of pulmonary embolism demonstrated on prior examination.  2. Mild centrilobular and paraseptal emphysema.  3. Ascending aortic aneurysm appears mildly enlarged compared with  the prior study. Of note, previously demonstrated pulmonary embolism  is not addressed on this noncontrast study.  Electronically Signed  By: Camie Patience M.D.  On: 08/07/2013 12:30   Impression:  He has a fusiform ascending aortic aneurysm with a maximum diameter of 4.9 cm. I have reviewed both of his CT scans myself and it appears that this aneurysm may have enlarged 2 mm since March 2015. The diameter of his descending aorta at the  same level is only 21 mm so I think 4.9 cm is very significant and warrants repair in this 43 year old gentleman. He has not had an echo to evaluate the aortic valve but I do not hear a murmur. I discussed surgical repair with him including the alternative of continued close follow up, the risks of aortic dissection or rupture, the risks of surgery and answered all of his questions. He says he has been reading a lot of journal articles about this problem. He would like to proceed with further workup and repair.  Plan:  1. 2D-echo to assess LV function and valvular anatomy and function  2. Cardiology evaluation and catheterization. He has a family history of heart disease and is a longtime heavy smoker so he is at significant risk for coronary disease even at his young age.  3. I will see him back after these have been completed to review the results and make surgical plans.

## 2013-09-11 ENCOUNTER — Ambulatory Visit: Payer: Medicare Other

## 2013-09-18 ENCOUNTER — Other Ambulatory Visit (HOSPITAL_COMMUNITY): Payer: Self-pay | Admitting: Surgery

## 2013-09-18 ENCOUNTER — Ambulatory Visit (HOSPITAL_COMMUNITY): Payer: Medicare Other | Attending: Cardiovascular Disease | Admitting: Radiology

## 2013-09-18 DIAGNOSIS — I712 Thoracic aortic aneurysm, without rupture, unspecified: Secondary | ICD-10-CM | POA: Diagnosis not present

## 2013-09-18 DIAGNOSIS — F17200 Nicotine dependence, unspecified, uncomplicated: Secondary | ICD-10-CM | POA: Diagnosis not present

## 2013-09-18 DIAGNOSIS — I2699 Other pulmonary embolism without acute cor pulmonale: Secondary | ICD-10-CM | POA: Insufficient documentation

## 2013-09-18 DIAGNOSIS — Q231 Congenital insufficiency of aortic valve: Secondary | ICD-10-CM | POA: Diagnosis not present

## 2013-09-18 DIAGNOSIS — E785 Hyperlipidemia, unspecified: Secondary | ICD-10-CM | POA: Diagnosis not present

## 2013-09-18 NOTE — Progress Notes (Signed)
Echocardiogram performed.  

## 2013-09-23 ENCOUNTER — Telehealth: Payer: Self-pay | Admitting: Cardiovascular Disease

## 2013-09-23 NOTE — Telephone Encounter (Signed)
Patient would like results of Echo. Please call and advise.

## 2013-09-23 NOTE — Telephone Encounter (Signed)
Reviewed result of echo with pt in regards to his ascending aortic aneurysm.  Advised the echo states moderately dilated however I can not tell him how that relates to size compared the to CT he had in April which demonstrated the aneurysm to be 4.9 cm.  Advised pt to call Dr Vivi Martens since after reviewing this I realized he ordered the testing.  Pt states understanding.

## 2013-10-11 ENCOUNTER — Ambulatory Visit (INDEPENDENT_AMBULATORY_CARE_PROVIDER_SITE_OTHER): Payer: Medicare Other | Admitting: Cardiovascular Disease

## 2013-10-11 ENCOUNTER — Encounter: Payer: Self-pay | Admitting: Cardiovascular Disease

## 2013-10-11 ENCOUNTER — Telehealth: Payer: Self-pay | Admitting: Cardiovascular Disease

## 2013-10-11 ENCOUNTER — Encounter: Payer: Self-pay | Admitting: *Deleted

## 2013-10-11 VITALS — BP 120/70 | HR 80 | Ht 66.0 in | Wt 204.0 lb

## 2013-10-11 DIAGNOSIS — I712 Thoracic aortic aneurysm, without rupture, unspecified: Secondary | ICD-10-CM

## 2013-10-11 DIAGNOSIS — Z720 Tobacco use: Secondary | ICD-10-CM

## 2013-10-11 DIAGNOSIS — F17200 Nicotine dependence, unspecified, uncomplicated: Secondary | ICD-10-CM | POA: Diagnosis not present

## 2013-10-11 DIAGNOSIS — I2699 Other pulmonary embolism without acute cor pulmonale: Secondary | ICD-10-CM

## 2013-10-11 DIAGNOSIS — E785 Hyperlipidemia, unspecified: Secondary | ICD-10-CM | POA: Diagnosis not present

## 2013-10-11 DIAGNOSIS — I7121 Aneurysm of the ascending aorta, without rupture: Secondary | ICD-10-CM

## 2013-10-11 NOTE — Telephone Encounter (Signed)
New Prob   Pt has a cath procedure schedule for 9/4. Requesting to speak to a nurse regarding procedure details and why a designated driver is required afterwards. Please call.

## 2013-10-11 NOTE — Assessment & Plan Note (Signed)
?   How long he will be on xarelto as he is finishing 6 months at end of this month  Symptoms resolved no cor pulmonale on echo  F/u pulmonary

## 2013-10-11 NOTE — Assessment & Plan Note (Signed)
Cholesterol is at goal.  Continue current dose of statin and diet Rx.  No myalgias or side effects.  F/U  LFT's in 6 months. Lab Results  Component Value Date   LDLCALC 49 05/06/2013

## 2013-10-11 NOTE — Assessment & Plan Note (Signed)
Aortopathy in setting of bicuspid valve  Descending aorta only 1.8 cm  Needs AVR and root replacement  Will arrange left and right cath with aortic root  9/4 earliest I can do.  Risks discussed willing to proceed Orders done.  Interesting that valve is not stenotic or leaky but clearly aortic root is worrisome.

## 2013-10-11 NOTE — Progress Notes (Signed)
Patient ID: Anais Koenen Suitt, male   DOB: Sep 23, 1970, 43 y.o.   MRN: [id number]  43 yo patient of Dr Leafy Kindle referred by Dr Cyndia Bent for preop evaluation.  Bilateral PE 3/15 on xarelto.  CRF;s faminly history, smoking and elevated lipids  Has a bicuspid AV with aortic root 4.9 cm  Reviewed echo from 09/18/13  Bicuspid AV no significant AS/AR but dilated aortic root.  I reviewed his CT and despite risk factors I saw no coronary calcium  He denies chest pain dyspnea or palpitations Has schizo affective disease that seems compensated.  Has been on xarelto for PE since March.  Compliant  Dyspnea improved     Study Conclusions  - Left ventricle: The cavity size was normal. Wall thickness was normal. Systolic function was normal. The estimated ejection fraction was in the range of 55% to 60%. Wall motion was normal; there were no regional wall motion abnormalities. Left ventricular diastolic function parameters were normal. - Aortic valve: Bicuspid. - Ascending aorta: The ascending aorta was moderately dilated. - Atrial septum: No defect or patent foramen ovale was identified.       ROS: Denies fever, malais, weight loss, blurry vision, decreased visual acuity, cough, sputum, SOB, hemoptysis, pleuritic pain, palpitaitons, heartburn, abdominal pain, melena, lower extremity edema, claudication, or rash.  All other systems reviewed and negative   General: Affect appropriate Healthy:  appears stated age 6: poor dentition  Neck supple with no adenopathy JVP normal no bruits no thyromegaly Lungs clear with no wheezing and good diaphragmatic motion Heart:  S1/S2 no murmur,rub, gallop or click PMI normal Abdomen: benighn, BS positve, no tenderness, no AAA no bruit.  No HSM or HJR Distal pulses intact with no bruits No edema Neuro non-focal Skin warm and dry multiple tatoos  No muscular weakness  Medications Current Outpatient Prescriptions  Medication Sig Dispense Refill  . clonazePAM  (KLONOPIN) 1 MG tablet Take 1 mg by mouth 3 (three) times daily.       Marland Kitchen ezetimibe (ZETIA) 10 MG tablet TAKE 1 TABLET ONCE A DAY  30 tablet  5  . fenofibrate micronized (ANTARA) 130 MG capsule Take 1 capsule (130 mg total) by mouth daily before breakfast.  30 capsule  3  . pravastatin (PRAVACHOL) 40 MG tablet Take 1 tablet (40 mg total) by mouth daily.  90 tablet  3  . rivaroxaban (XARELTO) 20 MG TABS tablet Take 1 tablet (20 mg total) by mouth daily with supper.  30 tablet  5  . sertraline (ZOLOFT) 100 MG tablet Take 1 tablet (100 mg total) by mouth 2 (two) times daily.  30 tablet  0  . traZODone (DESYREL) 100 MG tablet Take 100 mg by mouth at bedtime.        No current facility-administered medications for this visit.    Allergies Bactrim  Family History: Family History  Problem Relation Age of Onset  . Heart disease Father   . Stroke Father   . COPD Paternal 43   . Asthma Paternal Aunt   . Stroke Paternal Uncle   . Cancer Father     lung    Social History: History   Social History  . Marital Status: Legally Separated    Spouse Name: N/A    Number of Children: N/A  . Years of Education: N/A   Occupational History  . Not on file.   Social History Main Topics  . Smoking status: Current Every Day Smoker -- 1.00 packs/day for 22 years  Types: Cigarettes  . Smokeless tobacco: Never Used  . Alcohol Use: Yes     Comment: drinks 1 can of beer/month.    . Drug Use: No  . Sexual Activity: Yes   Other Topics Concern  . Not on file   Social History Narrative  . No narrative on file    Electrocardiogram:  SR rate 95 normal   Assessment and Plan

## 2013-10-11 NOTE — Assessment & Plan Note (Signed)
Discussed cessation and benefit even 1 week prior to any surgery  CXR nodule stable  Decreased V/Q reserve from PE

## 2013-10-11 NOTE — Patient Instructions (Addendum)
Your physician has requested that you have a cardiac catheterization. Cardiac catheterization is used to diagnose and/or treat various heart conditions. Doctors may recommend this procedure for a number of different reasons. The most common reason is to evaluate chest pain. Chest pain can be a symptom of coronary artery disease (CAD), and cardiac catheterization can show whether plaque is narrowing or blocking your heart's arteries. This procedure is also used to evaluate the valves, as well as measure the blood flow and oxygen levels in different parts of your heart. For further information please visit [email or web address]. Please follow instruction sheet, as given.   Your physician recommends that you return for lab work in:  10-28-13 [facility] [place] [facility] Your physician recommends that you continue on your current medications as directed. Please refer to the Current Medication list given to you today.

## 2013-10-11 NOTE — Telephone Encounter (Signed)
PT  AWARE OF  THE REASONING  THAT  SOMEONE  NEEDS TO BE   WITH HIM  24 HOURS  AFTER  CATH .Adonis Housekeeper

## 2013-10-14 ENCOUNTER — Telehealth: Payer: Self-pay | Admitting: Cardiovascular Disease

## 2013-10-14 NOTE — Telephone Encounter (Signed)
New message          Pt does not have anyone to be with him after his cath / Can you assist him with this

## 2013-10-14 NOTE — Telephone Encounter (Signed)
New message  Pt called. Requests a call back to discuss if he will need a lab before his Cath lab. Please call back to discuss cath instructions with pt//SR

## 2013-10-14 NOTE — Telephone Encounter (Signed)
Pt states he does not have anyone to stay with you after the cath. Pt states his friend will bring him to the hospital and take him back home. Pt is aware as long he has someone to check on him and make sure that  he is doing fine  the first 24 hours he will be okay. Pt verbalized understanding.

## 2013-10-16 ENCOUNTER — Telehealth: Payer: Self-pay | Admitting: Family Medicine

## 2013-10-16 ENCOUNTER — Ambulatory Visit: Payer: Medicare Other | Admitting: Surgery

## 2013-10-16 NOTE — Telephone Encounter (Signed)
Patient is aware that you are out of town this week he wants you to give him a call when you get back in town about his heart condition he really want you to explain it to him he feels more comfortable with you

## 2013-10-25 ENCOUNTER — Encounter (HOSPITAL_COMMUNITY): Payer: Self-pay | Admitting: Pharmacy Technician

## 2013-10-28 ENCOUNTER — Other Ambulatory Visit: Payer: Self-pay | Admitting: *Deleted

## 2013-10-28 ENCOUNTER — Other Ambulatory Visit (INDEPENDENT_AMBULATORY_CARE_PROVIDER_SITE_OTHER): Payer: Medicare Other

## 2013-10-28 DIAGNOSIS — E7800 Pure hypercholesterolemia, unspecified: Secondary | ICD-10-CM

## 2013-10-28 DIAGNOSIS — Z01810 Encounter for preprocedural cardiovascular examination: Secondary | ICD-10-CM | POA: Diagnosis not present

## 2013-10-28 DIAGNOSIS — I359 Nonrheumatic aortic valve disorder, unspecified: Secondary | ICD-10-CM

## 2013-10-28 LAB — BASIC METABOLIC PANEL
BUN: 9 mg/dL (ref 6–23)
CALCIUM: 9.5 mg/dL (ref 8.4–10.5)
CHLORIDE: 106 meq/L (ref 96–112)
CO2: 26 mEq/L (ref 19–32)
Creatinine, Ser: 1.2 mg/dL (ref 0.4–1.5)
GFR: 73.71 mL/min (ref >60.00)
Glucose, Bld: 99 mg/dL (ref 70–99)
Potassium: 3.6 mEq/L (ref 3.5–5.1)
Sodium: 140 mEq/L (ref 135–145)

## 2013-10-28 LAB — CBC WITH DIFFERENTIAL/PLATELET
BASOS ABS: 0.1 10*3/uL (ref 0.0–0.1)
Basophils Relative: 0.4 % (ref 0.0–3.0)
EOS ABS: 0.3 10*3/uL (ref 0.0–0.7)
Eosinophils Relative: 2.1 % (ref 0.0–5.0)
HCT: 48.9 % (ref 39.0–52.0)
Hemoglobin: 16.6 g/dL (ref 13.0–17.0)
LYMPHS PCT: 25.9 % (ref 12.0–46.0)
Lymphs Abs: 3.3 10*3/uL (ref 0.7–4.0)
MCHC: 33.9 g/dL (ref 30.0–36.0)
MCV: 98.5 fl (ref 78.0–100.0)
MONOS PCT: 4.6 % (ref 3.0–12.0)
Monocytes Absolute: 0.6 10*3/uL (ref 0.1–1.0)
Neutro Abs: 8.4 10*3/uL — ABNORMAL HIGH (ref 1.4–7.7)
Neutrophils Relative %: 67 % (ref 43.0–77.0)
PLATELETS: 291 10*3/uL (ref 150.0–400.0)
RBC: 4.96 Mil/uL (ref 4.22–5.81)
RDW: 13.1 % (ref 11.5–15.5)
WBC: 12.6 10*3/uL — ABNORMAL HIGH (ref 4.0–10.5)

## 2013-10-28 LAB — PROTIME-INR
INR: 1.2 ratio — ABNORMAL HIGH (ref 0.8–1.0)
PROTHROMBIN TIME: 12.9 s (ref 9.6–13.1)

## 2013-10-29 NOTE — Telephone Encounter (Signed)
Left message to call back  

## 2013-10-29 NOTE — Telephone Encounter (Signed)
We can use versed and fentanyl we never do under general.

## 2013-10-29 NOTE — Telephone Encounter (Signed)
Follow up    Having procedure on Friday would like to discuss.

## 2013-10-29 NOTE — Telephone Encounter (Signed)
Spoke with pt. He reports he read instructions which state he will be given local medication prior to cath. Pt requests procedure be done under general anesthesia. I explained to pt procedure is not usually done this way. Pt does not think he can handle procedure under local due to his mental health condition.  I told pt I would send his concerns to Dr. Johnsie Cancel and call him back with MD's recommendation.

## 2013-10-30 NOTE — Telephone Encounter (Signed)
Pt notified of information from Dr. Johnsie Cancel. Pt will be there for procedure as planned.

## 2013-10-31 ENCOUNTER — Other Ambulatory Visit: Payer: Self-pay | Admitting: Cardiovascular Disease

## 2013-10-31 DIAGNOSIS — R072 Precordial pain: Secondary | ICD-10-CM

## 2013-11-01 ENCOUNTER — Encounter (HOSPITAL_COMMUNITY)
Admission: RE | Disposition: A | Discharge status: Home / Self Care | Payer: Self-pay | Source: Ambulatory Visit | Attending: Cardiovascular Disease

## 2013-11-01 ENCOUNTER — Ambulatory Visit (HOSPITAL_COMMUNITY)
Admission: RE | Admit: 2013-11-01 | Discharge: 2013-11-01 | Disposition: A | Discharge status: Home / Self Care | Payer: Medicare Other | Source: Ambulatory Visit | Attending: Cardiovascular Disease | Admitting: Cardiovascular Disease

## 2013-11-01 DIAGNOSIS — F259 Schizoaffective disorder, unspecified: Secondary | ICD-10-CM | POA: Diagnosis not present

## 2013-11-01 DIAGNOSIS — Z7901 Long term (current) use of anticoagulants: Secondary | ICD-10-CM | POA: Diagnosis not present

## 2013-11-01 DIAGNOSIS — I77819 Aortic ectasia, unspecified site: Secondary | ICD-10-CM | POA: Insufficient documentation

## 2013-11-01 DIAGNOSIS — Q231 Congenital insufficiency of aortic valve: Secondary | ICD-10-CM | POA: Insufficient documentation

## 2013-11-01 DIAGNOSIS — I359 Nonrheumatic aortic valve disorder, unspecified: Secondary | ICD-10-CM | POA: Insufficient documentation

## 2013-11-01 DIAGNOSIS — Z86711 Personal history of pulmonary embolism: Secondary | ICD-10-CM | POA: Diagnosis not present

## 2013-11-01 DIAGNOSIS — I719 Aortic aneurysm of unspecified site, without rupture: Secondary | ICD-10-CM | POA: Insufficient documentation

## 2013-11-01 DIAGNOSIS — F17200 Nicotine dependence, unspecified, uncomplicated: Secondary | ICD-10-CM | POA: Diagnosis not present

## 2013-11-01 DIAGNOSIS — R072 Precordial pain: Secondary | ICD-10-CM

## 2013-11-01 HISTORY — PX: LEFT AND RIGHT HEART CATHETERIZATION WITH CORONARY ANGIOGRAM: SHX5449

## 2013-11-01 SURGERY — LEFT AND RIGHT HEART CATHETERIZATION WITH CORONARY ANGIOGRAM
Anesthesia: LOCAL

## 2013-11-01 MED ORDER — HEPARIN (PORCINE) IN NACL 2-0.9 UNIT/ML-% IJ SOLN
INTRAMUSCULAR | Status: AC
  Filled 2013-11-01: qty 1500

## 2013-11-01 MED ORDER — FENTANYL CITRATE 0.05 MG/ML IJ SOLN
INTRAMUSCULAR | Status: AC
  Filled 2013-11-01: qty 2

## 2013-11-01 MED ORDER — SODIUM CHLORIDE 0.9 % IJ SOLN: 3.0000 mL | INTRAMUSCULAR | Status: DC | PRN

## 2013-11-01 MED ORDER — OXYCODONE-ACETAMINOPHEN 5-325 MG PO TABS: 1.0000 | ORAL_TABLET | ORAL | Status: DC | PRN

## 2013-11-01 MED ORDER — SODIUM CHLORIDE 0.45 % IV SOLN: INTRAVENOUS | Status: DC

## 2013-11-01 MED ORDER — MIDAZOLAM HCL 2 MG/2ML IJ SOLN
INTRAMUSCULAR | Status: AC
  Filled 2013-11-01: qty 2

## 2013-11-01 MED ORDER — DIAZEPAM 5 MG PO TABS: 5.0000 mg | ORAL_TABLET | ORAL | Status: DC | PRN

## 2013-11-01 MED ORDER — ASPIRIN 81 MG PO CHEW
CHEWABLE_TABLET | ORAL | Status: AC
  Filled 2013-11-01: qty 1

## 2013-11-01 MED ORDER — SODIUM CHLORIDE 0.9 % IV SOLN: 250.0000 mL | INTRAVENOUS | Status: DC | PRN

## 2013-11-01 MED ORDER — NITROGLYCERIN 1 MG/10 ML FOR IR/CATH LAB
INTRA_ARTERIAL | Status: AC
  Filled 2013-11-01: qty 10

## 2013-11-01 MED ORDER — LIDOCAINE HCL (PF) 1 % IJ SOLN
INTRAMUSCULAR | Status: AC
  Filled 2013-11-01: qty 30

## 2013-11-01 MED ORDER — SODIUM CHLORIDE 0.9 % IJ SOLN: 3.0000 mL | Freq: Two times a day (BID) | INTRAMUSCULAR | Status: DC

## 2013-11-01 MED ORDER — SODIUM CHLORIDE 0.9 % IV SOLN
INTRAVENOUS | Status: DC
  Administered 2013-11-01: 09:00:00 via INTRAVENOUS

## 2013-11-01 MED ORDER — ASPIRIN 81 MG PO CHEW
81.0000 mg | CHEWABLE_TABLET | ORAL | Status: AC
  Administered 2013-11-01: 81 mg via ORAL

## 2013-11-01 NOTE — Progress Notes (Signed)
0.9NS d/c'ed. 0.45NS started at 100cc/hr.

## 2013-11-01 NOTE — Interval H&P Note (Signed)
History and Physical Interval Note:  11/01/2013 9:03 AM  Ian Grimes  has presented today for surgery, with the diagnosis of pre-op clearance  The various methods of treatment have been discussed with the patient and family. After consideration of risks, benefits and other options for treatment, the patient has consented to  Procedure(s): LEFT AND RIGHT HEART CATHETERIZATION WITH CORONARY ANGIOGRAM (N/A) as a surgical intervention .  The patient's history has been reviewed, patient examined, no change in status, stable for surgery.  I have reviewed the patient's chart and labs.  Questions were answered to the patient's satisfaction.     [name]

## 2013-11-01 NOTE — Discharge Instructions (Addendum)
F/U with Dr Cyndia Bent for surgical date Call if any pain or bleeding from groin  Angiogram, Care After  Refer to this sheet in the next few weeks. These instructions provide you with information on caring for yourself after your procedure. Your health care provider may also give you more specific instructions. Your treatment has been planned according to current medical practices, but problems sometimes occur. Call your health care provider if you have any problems or questions after your procedure.  WHAT TO EXPECT AFTER THE PROCEDURE After your procedure, it is typical to have the following sensations:  Minor discomfort or tenderness and a small bump at the catheter insertion site. The bump should usually decrease in size and tenderness within 1 to 2 weeks.  Any bruising will usually fade within 2 to 4 weeks. HOME CARE INSTRUCTIONS   You may need to keep taking blood thinners if they were prescribed for you. Take medicines only as directed by your health care provider.  Do not apply powder or lotion to the site.  Do not take baths, swim, or use a hot tub until your health care provider approves.  You may shower 24 hours after the procedure. Remove the bandage (dressing) and gently wash the site with plain soap and water. Gently pat the site dry.  Inspect the site at least twice daily.  Limit your activity for the first 24 hours. Do not bend, squat, or lift anything over 10 lb (9 kg) or as directed by your health care provider.  Plan to have someone take you home after the procedure. Follow instructions about when you can drive or return to work. SEEK MEDICAL CARE IF:  You get light-headed when standing up.  You have drainage (other than a small amount of blood on the dressing).  You have chills.  You have a fever.  You have redness, warmth, swelling, or pain at the insertion site. SEEK IMMEDIATE MEDICAL CARE IF:   You develop chest pain or shortness of breath, feel faint, or  pass out.  You have bleeding, swelling larger than a walnut, or drainage from the catheter insertion site.  You develop pain, discoloration, coldness, or severe bruising in the leg or arm that held the catheter.  You have heavy bleeding from the site. If this happens, hold pressure on the site. MAKE SURE YOU:  Understand these instructions.  Will watch your condition.  Will get help right away if you are not doing well or get worse. Document Released: 09/02/2004 Document Revised: 07/01/2013 Document Reviewed: 07/09/2012 [facility] Patient Information 2015 [place], [place]. This information is not intended to replace advice given to you by your health care provider. Make sure you discuss any questions you have with your health care provider.

## 2013-11-01 NOTE — H&P (View-Only) (Signed)
Patient ID: Ian Grimes, male   DOB: 03-24-1970, 43 y.o.   MRN: [id number]  43 yo patient of Dr Leafy Kindle referred by Dr Cyndia Bent for preop evaluation.  Bilateral PE 3/15 on xarelto.  CRF;s faminly history, smoking and elevated lipids  Has a bicuspid AV with aortic root 4.9 cm  Reviewed echo from 09/18/13  Bicuspid AV no significant AS/AR but dilated aortic root.  I reviewed his CT and despite risk factors I saw no coronary calcium  He denies chest pain dyspnea or palpitations Has schizo affective disease that seems compensated.  Has been on xarelto for PE since March.  Compliant  Dyspnea improved     Study Conclusions  - Left ventricle: The cavity size was normal. Wall thickness was normal. Systolic function was normal. The estimated ejection fraction was in the range of 55% to 60%. Wall motion was normal; there were no regional wall motion abnormalities. Left ventricular diastolic function parameters were normal. - Aortic valve: Bicuspid. - Ascending aorta: The ascending aorta was moderately dilated. - Atrial septum: No defect or patent foramen ovale was identified.       ROS: Denies fever, malais, weight loss, blurry vision, decreased visual acuity, cough, sputum, SOB, hemoptysis, pleuritic pain, palpitaitons, heartburn, abdominal pain, melena, lower extremity edema, claudication, or rash.  All other systems reviewed and negative   General: Affect appropriate Healthy:  appears stated age 33: poor dentition  Neck supple with no adenopathy JVP normal no bruits no thyromegaly Lungs clear with no wheezing and good diaphragmatic motion Heart:  S1/S2 no murmur,rub, gallop or click PMI normal Abdomen: benighn, BS positve, no tenderness, no AAA no bruit.  No HSM or HJR Distal pulses intact with no bruits No edema Neuro non-focal Skin warm and dry multiple tatoos  No muscular weakness  Medications Current Outpatient Prescriptions  Medication Sig Dispense Refill  . clonazePAM  (KLONOPIN) 1 MG tablet Take 1 mg by mouth 3 (three) times daily.       Marland Kitchen ezetimibe (ZETIA) 10 MG tablet TAKE 1 TABLET ONCE A DAY  30 tablet  5  . fenofibrate micronized (ANTARA) 130 MG capsule Take 1 capsule (130 mg total) by mouth daily before breakfast.  30 capsule  3  . pravastatin (PRAVACHOL) 40 MG tablet Take 1 tablet (40 mg total) by mouth daily.  90 tablet  3  . rivaroxaban (XARELTO) 20 MG TABS tablet Take 1 tablet (20 mg total) by mouth daily with supper.  30 tablet  5  . sertraline (ZOLOFT) 100 MG tablet Take 1 tablet (100 mg total) by mouth 2 (two) times daily.  30 tablet  0  . traZODone (DESYREL) 100 MG tablet Take 100 mg by mouth at bedtime.        No current facility-administered medications for this visit.    Allergies Bactrim  Family History: Family History  Problem Relation Age of Onset  . Heart disease Father   . Stroke Father   . COPD Paternal 5   . Asthma Paternal Aunt   . Stroke Paternal Uncle   . Cancer Father     lung    Social History: History   Social History  . Marital Status: Legally Separated    Spouse Name: N/A    Number of Children: N/A  . Years of Education: N/A   Occupational History  . Not on file.   Social History Main Topics  . Smoking status: Current Every Day Smoker -- 1.00 packs/day for 22 years  Types: Cigarettes  . Smokeless tobacco: Never Used  . Alcohol Use: Yes     Comment: drinks 1 can of beer/month.    . Drug Use: No  . Sexual Activity: Yes   Other Topics Concern  . Not on file   Social History Narrative  . No narrative on file    Electrocardiogram:  SR rate 95 normal   Assessment and Plan

## 2013-11-01 NOTE — CV Procedure (Signed)
       Catheterization   Indication:  Pre AVR Aortic Aneurysm associated with bicuspid AV  Procedure: After informed consent and clinical "time out" the right groin was prepped and draped in a sterile fashion.  A 5Fr sheath was placed in the right femoral artery using seldinger technique and local lidocaine.  Standard JL4, JR4 and angled pigtail catheters were used to engage the coronary arteries.  Coronary arteries were visualized in orthogonal views using caudal and cranial angulation.  RAO ventriculography was done using 28* cc of contrast.  LAO Aortography done using 40 cc contrast.  Right heart cath done from RFV using 6 Fr catheter  Medications:   Versed: 4 mg's  Fentanyl: 50 ug's  Coronary Arteries: Right dominant with no anomalies  LM: Normal  LAD: Normal    IM: Normal  D1: Normal    Circumflex:  Normal   OM1: Normal  OM2: Normal  OM3: Distal AV goove normal  RCA:  Normal Dominant   PDA: Normal  PLA: Normal   Ventriculography: EF: 65 %,   Pull back gradient only 20 mmHg  Aortography:  Severe ascending aortic root dilatation with no significant AR  Hemodynamics:  Aortic Pressure: 105 68 mmHg  LV Pressure :85 54  MmHg  Right Heart:    Mean RA 4 mmHg RV:  25/2 mmHg PA:  21/8 mean 13 mmHg PCWP:  11 mmHg  CO:  2.61 Liters/min  Seems low  Impression:  Normal coronary arteries  Bicuspid AV with mild AS.  Severe aortic root dilatation.  Normal right heart pressures  F/U Dr Georgia Dom [facility] 11/01/2013 9:43 AM

## 2013-11-01 NOTE — Progress Notes (Signed)
Site area: rt groin Site Prior to Removal:  Level 0 Pressure Applied For: 20 minutes Manual:   yes Patient Status During Pull:  stable Post Pull Site:  Level 0 Post Pull Instructions Given:  yes Post Pull Pulses Present: yes Dressing Applied:  tegaderm Bedrest begins @ 1025 Comments: no complications   

## 2013-11-05 LAB — POCT I-STAT 3, ART BLOOD GAS (G3+)
Acid-base deficit: 4 mmol/L — ABNORMAL HIGH (ref 0.0–2.0)
Bicarbonate: 20.8 mEq/L (ref 20.0–24.0)
O2 Saturation: 96 %
PH ART: 7.366 (ref 7.350–7.450)
TCO2: 22 mmol/L (ref 0–100)
pCO2 arterial: 36.4 mmHg (ref 35.0–45.0)
pO2, Arterial: 84 mmHg (ref 80.0–100.0)

## 2013-11-05 LAB — POCT I-STAT 3, VENOUS BLOOD GAS (G3P V)
ACID-BASE DEFICIT: 2 mmol/L (ref 0.0–2.0)
BICARBONATE: 23.7 meq/L (ref 20.0–24.0)
O2 Saturation: 47 %
PO2 VEN: 27 mmHg — AB (ref 30.0–45.0)
TCO2: 25 mmol/L (ref 0–100)
pCO2, Ven: 42 mmHg — ABNORMAL LOW (ref 45.0–50.0)
pH, Ven: 7.36 — ABNORMAL HIGH (ref 7.250–7.300)

## 2013-11-06 ENCOUNTER — Encounter: Payer: Self-pay | Admitting: Surgery

## 2013-11-06 ENCOUNTER — Ambulatory Visit (INDEPENDENT_AMBULATORY_CARE_PROVIDER_SITE_OTHER): Payer: Medicare Other | Admitting: Surgery

## 2013-11-06 ENCOUNTER — Other Ambulatory Visit: Payer: Self-pay

## 2013-11-06 VITALS — BP 115/83 | HR 71 | Resp 20 | Ht 72.0 in | Wt 210.0 lb

## 2013-11-06 DIAGNOSIS — I712 Thoracic aortic aneurysm, without rupture, unspecified: Secondary | ICD-10-CM

## 2013-11-06 DIAGNOSIS — Q231 Congenital insufficiency of aortic valve: Secondary | ICD-10-CM

## 2013-11-06 DIAGNOSIS — I7121 Aneurysm of the ascending aorta, without rupture: Secondary | ICD-10-CM

## 2013-11-06 DIAGNOSIS — Q249 Congenital malformation of heart, unspecified: Secondary | ICD-10-CM | POA: Diagnosis not present

## 2013-11-07 ENCOUNTER — Encounter: Payer: Self-pay | Admitting: Surgery

## 2013-11-07 ENCOUNTER — Telehealth: Payer: Self-pay | Admitting: Family Medicine

## 2013-11-07 ENCOUNTER — Encounter (HOSPITAL_COMMUNITY): Payer: Self-pay

## 2013-11-07 DIAGNOSIS — Q231 Congenital insufficiency of aortic valve: Secondary | ICD-10-CM | POA: Insufficient documentation

## 2013-11-07 NOTE — Progress Notes (Signed)
HPI:  The patient returns for follow up after cardiology evaluation, echocardiogram and cardiac cath. He was seen by Dr. Johnsie Cancel. The echo shows a bicuspid aortic valve with no stenosis or regurgitation. Cardiac cath shows no coronary disease with dilatation of the aortic root and ascending aorta.  Current Outpatient Prescriptions  Medication Sig Dispense Refill  . clonazePAM (KLONOPIN) 1 MG tablet Take 1 mg by mouth 3 (three) times daily.       Marland Kitchen ezetimibe (ZETIA) 10 MG tablet Take 10 mg by mouth daily.      . fenofibrate micronized (ANTARA) 130 MG capsule Take 1 capsule (130 mg total) by mouth daily before breakfast.  30 capsule  3  . pravastatin (PRAVACHOL) 40 MG tablet Take 1 tablet (40 mg total) by mouth daily.  90 tablet  3  . Pseudoephedrine HCl (SUDAFED PO) Take 2 tablets by mouth 2 (two) times daily as needed (sinus headache).      . rivaroxaban (XARELTO) 20 MG TABS tablet Take 1 tablet (20 mg total) by mouth daily with supper.  30 tablet  5  . sertraline (ZOLOFT) 100 MG tablet Take 1 tablet (100 mg total) by mouth 2 (two) times daily.  30 tablet  0  . Tetrahydrozoline HCl (VISINE OP) Place 2 drops into both eyes daily as needed (dry eyes).      . traZODone (DESYREL) 100 MG tablet Take 100 mg by mouth at bedtime.        No current facility-administered medications for this visit.     Physical Exam: BP 115/83  Pulse 71  Resp 20  Ht 6' (1.829 m)  Wt 210 lb (95.255 kg)  BMI 28.47 kg/m2  SpO2 98% He looks well Lungs are clear Cardiac exam shows a regular rate and rhythm with normal heart sounds.  Diagnostic Tests:  Catheterization  Indication: Pre AVR Aortic Aneurysm associated with bicuspid AV  Procedure: After informed consent and clinical "time out" the right groin was prepped and draped in a sterile fashion. A 5Fr sheath was placed in the right femoral artery using seldinger technique and local lidocaine. Standard JL4, JR4 and angled pigtail catheters were used  to engage the coronary arteries. Coronary arteries were visualized in orthogonal views using caudal and cranial angulation. RAO ventriculography was done using 28* cc of contrast. LAO Aortography done using 40 cc contrast. Right heart cath done from RFV using 6 Fr catheter  Medications:  Versed: 4 mg's Fentanyl: 50 ug's  Coronary Arteries:  Right dominant with no anomalies  LM: Normal   LAD: Normal  IM: Normal  D1: Normal  Circumflex: Normal  OM1: Normal  OM2: Normal  OM3: Distal AV goove normal  RCA: Normal Dominant  PDA: Normal  PLA: Normal  Ventriculography: EF: 65 %, Pull back gradient only 20 mmHg  Aortography: Severe ascending aortic root dilatation with no significant AR  Hemodynamics:  Aortic Pressure: 105 68 mmHg LV Pressure :85 54 MmHg  Right Heart:  Mean RA 4 mmHg  RV: 25/2 mmHg  PA: 21/8 mean 13 mmHg  PCWP: 11 mmHg  CO: 2.61 Liters/min Seems low  Impression: Normal coronary arteries Bicuspid AV with mild AS. Severe aortic root dilatation. Normal right heart pressures F/U Dr Georgia Dom [facility]  11/01/2013  9:43 AM    *[facility]* [address], [place] [address] [phone]  ------------------------------------------------------------------- Transthoracic Echocardiography  Patient: Sayavong, Josua Ferrebee MR #: [id number] Study Date: 09/18/2013 Gender: M Age: 17  Height: 182.9 cm Weight: 95.3 kg BSA: 2.22 m^2 Pt. Status: Room:  Gas, MD [place], MD SONOGRAPHER Cindy Hazy, RDCS ATTENDING Sanda Klein, MD PERFORMING [facility], Outpatient  cc:  ------------------------------------------------------------------- LV EF: 55% - 60%  ------------------------------------------------------------------- Indications: Thoracic Aortic Aneurysm 441.2  ------------------------------------------------------------------- History: PMH: Pulmonary Embolism. Ascending Aortic Aneurysm. Risk factors: Current tobacco  use. Dyslipidemia.  ------------------------------------------------------------------- Study Conclusions  - Left ventricle: The cavity size was normal. Wall thickness was normal. Systolic function was normal. The estimated ejection fraction was in the range of 55% to 60%. Wall motion was normal; there were no regional wall motion abnormalities. Left ventricular diastolic function parameters were normal. - Aortic valve: Bicuspid. - Ascending aorta: The ascending aorta was moderately dilated. - Atrial septum: No defect or patent foramen ovale was identified.  Transthoracic echocardiography. M-mode, complete 2D, spectral Doppler, and color Doppler. Birthdate: Patient birthdate: Sep 28, 1970. Age: Patient is 43 yr old. Sex: Gender: male. Height: Height: 182.9 cm. Height: 72 in. Weight: Weight: 95.3 kg. Weight: 209.6 lb. Body mass index: BMI: 28.5 kg/m^2. Body surface area: BSA: 2.22 m^2. Blood pressure: 102/71 Patient status: Outpatient. Study date: Study date: 09/18/2013. Study time: 09:37 AM. Location: [facility]  -------------------------------------------------------------------  ------------------------------------------------------------------- Left ventricle: The cavity size was normal. Wall thickness was normal. Systolic function was normal. The estimated ejection fraction was in the range of 55% to 60%. Wall motion was normal; there were no regional wall motion abnormalities. The transmitral flow pattern was normal. The deceleration time of the early transmitral flow velocity was normal. The pulmonary vein flow pattern was normal. The tissue Doppler parameters were normal. Left ventricular diastolic function parameters were normal. There was no evidence of elevated ventricular filling pressure by Doppler parameters.  ------------------------------------------------------------------- Aortic valve: Bicuspid. Doppler: There was no stenosis. There was no significant  regurgitation.  ------------------------------------------------------------------- Aorta: The aorta was without evidence of coarctation. Ascending aorta: The ascending aorta was moderately dilated.  ------------------------------------------------------------------- Mitral valve: Structurally normal valve. Leaflet separation was normal. Doppler: Transvalvular velocity was within the normal range. There was no evidence for stenosis. There was trivial regurgitation. Peak gradient (D): 3 mm Hg.  ------------------------------------------------------------------- Left atrium: The atrium was normal in size.  ------------------------------------------------------------------- Atrial septum: No defect or patent foramen ovale was identified.  ------------------------------------------------------------------- Right ventricle: The cavity size was normal. Wall thickness was normal. Systolic function was normal.  ------------------------------------------------------------------- Pulmonic valve: The valve appears to be grossly normal. Doppler: There was no significant regurgitation.  ------------------------------------------------------------------- Tricuspid valve: Structurally normal valve. Leaflet separation was normal. Doppler: Transvalvular velocity was within the normal range. There was no regurgitation.  ------------------------------------------------------------------- Pulmonary artery: Systolic pressure could not be accurately estimated.  ------------------------------------------------------------------- Right atrium: The atrium was normal in size.  ------------------------------------------------------------------- Pericardium: There was no pericardial effusion.  ------------------------------------------------------------------- Systemic veins: Inferior vena cava: The vessel was normal in size. The respirophasic diameter changes were in the normal range (=  50%), consistent with normal central venous pressure.  ------------------------------------------------------------------- Post procedure conclusions Ascending Aorta:  - The aorta was without evidence of coarctation.  ------------------------------------------------------------------- Prepared and Electronically Authenticated by  Sanda Klein, MD 2015-07-22T13:11:07  ------------------------------------------------------------------- Measurements  Left ventricle Value Reference LV ID, ED, PLAX chordal (N) 50 mm 43 - 52 LV ID, ES, PLAX chordal (N) 27 mm 23 - 38 LV fx shortening, PLAX chordal (N) 46 % >=29 LV PW thickness, ED 8 mm --------- IVS/LV PW ratio, ED (N) 1 <=1.3 Stroke volume, 2D 94 ml --------- Stroke volume/bsa, 2D 42 ml/m^2 ---------  LV e&', lateral 10.9 cm/s --------- LV E/e&', lateral 8.06 --------- LV e&', medial 8.05 cm/s --------- LV E/e&', medial 10.91 --------- LV e&', average 9.48 cm/s --------- LV E/e&', average 9.27 ---------  Ventricular septum Value Reference IVS thickness, ED 8 mm ---------  LVOT Value Reference LVOT ID, S 24 mm --------- LVOT area 4.52 cm^2 --------- LVOT VTI, S 20.9 cm ---------  Aorta Value Reference Aortic root ID, ED 38 mm --------- Ascending aorta ID, A-P, S 48 mm ---------  Left atrium Value Reference LA ID, A-P, ES 33 mm --------- LA ID/bsa, A-P (N) 1.49 cm/m^2 <=2.2  Mitral valve Value Reference Mitral E-wave peak velocity 87.8 cm/s --------- Mitral A-wave peak velocity 63.4 cm/s --------- Mitral deceleration time (N) 197 ms 150 - 230 Mitral peak gradient, D 3 mm Hg --------- Mitral E/A ratio, peak 1.4 ---------  Right ventricle Value Reference RV s&', lateral, S 12.4 cm/s ---------  Legend: (L) and (H) mark values outside specified reference range.  (N) marks values inside specified reference range.    Impression:  He has a bicuspid aortic valve with an aortic root and ascending aortic aneurysm  with a maximum diameter of 4.9 cm with a normal descending aortic diameter of 21 mm. There is no coronary disease and normal LV function. I think replacement of the aortic root and ascending aorta is indicated to prevent aortic dissection and progressive enlargement with rupture. His aortic valve is bicuspid but has no stenosis or regurgitation and may be suitable for reimplantation during root replacement. If the valve looks favorable and can be spared there is a 90% freedom from valve replacement over 15 years. I think he would be much better off with a normal functioning bicuspid valve than a mechanical prosthesis at his young age. I did discuss the use of a mechanical valve if the valve was not suitable for reimplantation and he is in agreement with that. He seem reliable and feels that he can take coumadin reliably and maintain the needed follow up. I discussed the operative procedure with the patient including alternatives, benefits and risks; including but not limited to bleeding, blood transfusion, infection, stroke, myocardial infarction, coronary anastomotic stenosis,  heart block requiring a permanent pacemaker, organ dysfunction, and death.  Orson Ape Slavick understands and agrees to proceed.  We will schedule surgery for Thursday 11/14/2013.  Plan:  Valve sparing aortic root replacement or mechanical valve Bentall on 11/14/2013.

## 2013-11-07 NOTE — Telephone Encounter (Signed)
appt given for Monday

## 2013-11-11 ENCOUNTER — Ambulatory Visit: Payer: Medicare Other | Admitting: Family Medicine

## 2013-11-12 ENCOUNTER — Inpatient Hospital Stay (HOSPITAL_COMMUNITY)
Admission: RE | Admit: 2013-11-12 | Discharge: 2013-11-12 | Disposition: A | Discharge status: Home / Self Care | Payer: Medicare Other | Source: Ambulatory Visit | Attending: Surgery | Admitting: Surgery

## 2013-11-12 ENCOUNTER — Encounter (HOSPITAL_COMMUNITY)
Admission: RE | Admit: 2013-11-12 | Discharge: 2013-11-12 | Disposition: A | Discharge status: Home / Self Care | Payer: Medicare Other | Source: Ambulatory Visit | Attending: Surgery | Admitting: Surgery

## 2013-11-12 ENCOUNTER — Other Ambulatory Visit: Payer: Self-pay | Admitting: Family Medicine

## 2013-11-12 ENCOUNTER — Ambulatory Visit (HOSPITAL_COMMUNITY)
Admission: RE | Admit: 2013-11-12 | Discharge: 2013-11-12 | Disposition: A | Discharge status: Home / Self Care | Payer: Medicare Other | Source: Ambulatory Visit | Attending: Surgery | Admitting: Surgery

## 2013-11-12 ENCOUNTER — Encounter (HOSPITAL_COMMUNITY): Payer: Self-pay

## 2013-11-12 ENCOUNTER — Other Ambulatory Visit (HOSPITAL_COMMUNITY): Payer: Medicare Other

## 2013-11-12 VITALS — BP 100/76 | HR 89 | Temp 98.0°F | Resp 20 | Ht 72.0 in | Wt 199.7 lb

## 2013-11-12 DIAGNOSIS — I712 Thoracic aortic aneurysm, without rupture, unspecified: Secondary | ICD-10-CM | POA: Insufficient documentation

## 2013-11-12 DIAGNOSIS — I6529 Occlusion and stenosis of unspecified carotid artery: Secondary | ICD-10-CM | POA: Diagnosis not present

## 2013-11-12 DIAGNOSIS — G4730 Sleep apnea, unspecified: Secondary | ICD-10-CM

## 2013-11-12 DIAGNOSIS — I7121 Aneurysm of the ascending aorta, without rupture: Secondary | ICD-10-CM

## 2013-11-12 DIAGNOSIS — I2782 Chronic pulmonary embolism: Secondary | ICD-10-CM | POA: Insufficient documentation

## 2013-11-12 DIAGNOSIS — F17200 Nicotine dependence, unspecified, uncomplicated: Secondary | ICD-10-CM

## 2013-11-12 DIAGNOSIS — J209 Acute bronchitis, unspecified: Secondary | ICD-10-CM | POA: Insufficient documentation

## 2013-11-12 DIAGNOSIS — Z7901 Long term (current) use of anticoagulants: Secondary | ICD-10-CM

## 2013-11-12 DIAGNOSIS — Z01810 Encounter for preprocedural cardiovascular examination: Secondary | ICD-10-CM | POA: Insufficient documentation

## 2013-11-12 DIAGNOSIS — Z01812 Encounter for preprocedural laboratory examination: Secondary | ICD-10-CM

## 2013-11-12 DIAGNOSIS — Z7982 Long term (current) use of aspirin: Secondary | ICD-10-CM | POA: Insufficient documentation

## 2013-11-12 DIAGNOSIS — Z01811 Encounter for preprocedural respiratory examination: Secondary | ICD-10-CM

## 2013-11-12 DIAGNOSIS — Z79899 Other long term (current) drug therapy: Secondary | ICD-10-CM | POA: Insufficient documentation

## 2013-11-12 DIAGNOSIS — R911 Solitary pulmonary nodule: Secondary | ICD-10-CM

## 2013-11-12 DIAGNOSIS — Z01818 Encounter for other preprocedural examination: Secondary | ICD-10-CM | POA: Diagnosis not present

## 2013-11-12 LAB — SURGICAL PCR SCREEN
MRSA, PCR: NEGATIVE
Staphylococcus aureus: NEGATIVE

## 2013-11-12 LAB — COMPREHENSIVE METABOLIC PANEL
ALBUMIN: 4 g/dL (ref 3.5–5.2)
ALT: 23 U/L (ref 0–53)
ANION GAP: 19 — AB (ref 5–15)
AST: 30 U/L (ref 0–37)
Alkaline Phosphatase: 58 U/L (ref 39–117)
BUN: 15 mg/dL (ref 6–23)
CO2: 14 mEq/L — ABNORMAL LOW (ref 19–32)
CREATININE: 0.84 mg/dL (ref 0.50–1.35)
Calcium: 9.3 mg/dL (ref 8.4–10.5)
Chloride: 105 mEq/L (ref 96–112)
GFR calc Af Amer: >90 mL/min (ref >90)
GFR calc non Af Amer: >90 mL/min (ref >90)
Glucose, Bld: 106 mg/dL — ABNORMAL HIGH (ref 70–99)
Potassium: 4.5 mEq/L (ref 3.7–5.3)
Sodium: 138 mEq/L (ref 137–147)
Total Bilirubin: 0.3 mg/dL (ref 0.3–1.2)
Total Protein: 6.9 g/dL (ref 6.0–8.3)

## 2013-11-12 LAB — BLOOD GAS, ARTERIAL
ACID-BASE DEFICIT: 3.1 mmol/L — AB (ref 0.0–2.0)
Bicarbonate: 21 mEq/L (ref 20.0–24.0)
DRAWN BY: 206361
FIO2: 0.21 %
O2 SAT: 97.2 %
PATIENT TEMPERATURE: 98.6
PCO2 ART: 35.5 mmHg (ref 35.0–45.0)
TCO2: 22.1 mmol/L (ref 0–100)
pH, Arterial: 7.391 (ref 7.350–7.450)
pO2, Arterial: 88.2 mmHg (ref 80.0–100.0)

## 2013-11-12 LAB — PULMONARY FUNCTION TEST
DL/VA % pred: 93 %
DL/VA: 4.3 ml/min/mmHg/L
DLCO COR % PRED: 81 %
DLCO COR: 25.19 ml/min/mmHg
DLCO UNC % PRED: 85 %
DLCO unc: 26.56 ml/min/mmHg
FEF 25-75 Post: 3.36 L/sec
FEF 25-75 Pre: 1.86 L/sec
FEF2575-%Change-Post: 80 %
FEF2575-%Pred-Post: 90 %
FEF2575-%Pred-Pre: 50 %
FEV1-%CHANGE-POST: 17 %
FEV1-%PRED-POST: 78 %
FEV1-%Pred-Pre: 66 %
FEV1-Post: 3.16 L
FEV1-Pre: 2.68 L
FEV1FVC-%Change-Post: 6 %
FEV1FVC-%PRED-PRE: 92 %
FEV6-%Change-Post: 11 %
FEV6-%PRED-PRE: 73 %
FEV6-%Pred-Post: 82 %
FEV6-POST: 4.05 L
FEV6-Pre: 3.63 L
FEV6FVC-%Change-Post: 1 %
FEV6FVC-%PRED-POST: 102 %
FEV6FVC-%PRED-PRE: 101 %
FVC-%CHANGE-POST: 10 %
FVC-%PRED-PRE: 72 %
FVC-%Pred-Post: 80 %
FVC-PRE: 3.68 L
FVC-Post: 4.08 L
POST FEV6/FVC RATIO: 100 %
PRE FEV6/FVC RATIO: 99 %
Post FEV1/FVC ratio: 77 %
Pre FEV1/FVC ratio: 73 %

## 2013-11-12 LAB — CBC
HCT: 49.3 % (ref 39.0–52.0)
Hemoglobin: 17 g/dL (ref 13.0–17.0)
MCH: 34.3 pg — AB (ref 26.0–34.0)
MCHC: 34.5 g/dL (ref 30.0–36.0)
MCV: 99.6 fL (ref 78.0–100.0)
Platelets: 223 10*3/uL (ref 150–400)
RBC: 4.95 MIL/uL (ref 4.22–5.81)
RDW: 13.1 % (ref 11.5–15.5)
WBC: 11.5 10*3/uL — AB (ref 4.0–10.5)

## 2013-11-12 LAB — URINALYSIS, ROUTINE W REFLEX MICROSCOPIC
Bilirubin Urine: NEGATIVE
GLUCOSE, UA: NEGATIVE mg/dL
HGB URINE DIPSTICK: NEGATIVE
KETONES UR: NEGATIVE mg/dL
Leukocytes, UA: NEGATIVE
Nitrite: NEGATIVE
PH: 5.5 (ref 5.0–8.0)
Protein, ur: NEGATIVE mg/dL
Specific Gravity, Urine: 1.024 (ref 1.005–1.030)
Urobilinogen, UA: 1 mg/dL (ref 0.0–1.0)

## 2013-11-12 LAB — PROTIME-INR
INR: 0.99 (ref 0.00–1.49)
Prothrombin Time: 13.1 seconds (ref 11.6–15.2)

## 2013-11-12 LAB — HEMOGLOBIN A1C
HEMOGLOBIN A1C: 5.6 % (ref <5.7)
Mean Plasma Glucose: 114 mg/dL (ref <117)

## 2013-11-12 LAB — APTT: APTT: 30 s (ref 24–37)

## 2013-11-12 LAB — ABO/RH: ABO/RH(D): A POS

## 2013-11-12 MED ORDER — ALBUTEROL SULFATE (2.5 MG/3ML) 0.083% IN NEBU
2.5000 mg | INHALATION_SOLUTION | Freq: Once | RESPIRATORY_TRACT | Status: AC
  Administered 2013-11-12: 2.5 mg via RESPIRATORY_TRACT

## 2013-11-12 NOTE — Progress Notes (Signed)
Pt. Was instructed to stop xarelto on 11/06/13.  During psycho-social assessment,  pt. expressing hopelessness due to his medical condition of heart surgery. Denies hurting himself or others. Pt. Agreed to talk to a Education officer, museum. Contacted Evelena Peat, a Education officer, museum, here at [facility]. Pt. Is to have Pul. Function testing today at 2 pm. Evelena Peat stated Jeral Fruit another social worker will come to resp. Therapy and talk to pt. Pt. After his test.

## 2013-11-12 NOTE — Progress Notes (Signed)
Spoke with pt re: his feelings of hopelessness.  Pt denies SI/HI, but is anxious about his upcoming surgery.  Pt concerned that his quality of life may be negatively affected by his surgery and that it may not prolong his life for that long.  Pt admits to using the internet to get information re: his condition, instead of consulting his MD.  Pt expresses confidence in his MD, but still has concerns re: waking up from surgery or being incapacitated afterwards.  Pt states that he has very limited social support and was disowned by both sides of his family at an early age.  Pt reports no children or significant other.  He states he only lists an aunt as his emergency contact because he had to, but doesn't believe she would care if he lives or dies. CSW offered pt emotional support/supportive counseling and will ask unit CSW to f/u with pt following his surgery.  Pt appreciative of CSW visit.

## 2013-11-12 NOTE — Progress Notes (Signed)
11/12/13 1315  OBSTRUCTIVE SLEEP APNEA  Have you ever been diagnosed with sleep apnea through a sleep study? No  Do you snore loudly (loud enough to be heard through closed doors)?  1  Do you often feel tired, fatigued, or sleepy during the daytime? 1  Has anyone observed you stop breathing during your sleep? 0  Do you have, or are you being treated for high blood pressure? 0  BMI more than 35 kg/m2? 0  Age over 43 years old? 0  Neck circumference greater than 40 cm/16 inches? 1  Gender: 1  Obstructive Sleep Apnea Score 4  Score 4 or greater  Results sent to PCP

## 2013-11-12 NOTE — Pre-Procedure Instructions (Signed)
Ian Grimes  11/12/2013   Your procedure is scheduled on:  Thursday, September 17  Report to [facility] Main Entrance "A" at 5:30AM.  Call this number if you have problems the morning of surgery: [phone]   Remember:   Do not eat food or drink liquids after midnight.   Take these medicines the morning of surgery with A SIP OF WATER: clonazepam (klonopin), sertraline (zoloft)   Do not wear jewelry, make-up or nail polish.  Do not wear lotions, powders, or perfumes. You may wear deodorant.  Do not shave 48 hours prior to surgery. Men may shave face and neck.  Do not bring valuables to the hospital.  [facility] is not responsible                  for any belongings or valuables.               Contacts, dentures or bridgework may not be worn into surgery.  Leave suitcase in the car. After surgery it may be brought to your room.  For patients admitted to the hospital, discharge time is determined by your                treatment team.               Patients discharged the day of surgery will not be allowed to drive  home.  Name and phone number of your driver:   Special Instructions: review preparing for surgery   Please read over the following fact sheets that you were given: Pain Booklet, Coughing and Deep Breathing, Blood Transfusion Information, Open Heart Packet, MRSA Information and Surgical Site Infection Prevention

## 2013-11-12 NOTE — Progress Notes (Signed)
VASCULAR LAB PRELIMINARY  PRELIMINARY  PRELIMINARY  PRELIMINARY  Pre-op Cardiac Surgery  Carotid Findings:  Bilateral:  1-39% ICA stenosis.  Vertebral artery flow is antegrade.      Upper Extremity Right Left  Brachial Pressures 108  Triphasic  101  Triphasic   Radial Waveforms Triphasic  Triphasic   Ulnar Waveforms Triphasic  Triphasic   Palmar Arch (Allen's Test) Within normal limits  Within normal limits      Ian Grimes, RVT 11/12/2013, 11:55 AM

## 2013-11-13 MED ORDER — NITROGLYCERIN IN D5W 200-5 MCG/ML-% IV SOLN
2.0000 ug/min | INTRAVENOUS | Status: DC
  Filled 2013-11-13: qty 250

## 2013-11-13 MED ORDER — SODIUM CHLORIDE 0.9 % IV SOLN
INTRAVENOUS | Status: AC
  Administered 2013-11-14: 1.5 [IU]/h via INTRAVENOUS
  Administered 2013-11-14: 1.1 [IU]/h via INTRAVENOUS
  Filled 2013-11-13: qty 2.5

## 2013-11-13 MED ORDER — DEXTROSE 5 % IV SOLN
750.0000 mg | INTRAVENOUS | Status: DC
  Filled 2013-11-13: qty 750

## 2013-11-13 MED ORDER — PHENYLEPHRINE HCL 10 MG/ML IJ SOLN
30.0000 ug/min | INTRAVENOUS | Status: AC
  Administered 2013-11-14: 25 ug/min via INTRAVENOUS
  Filled 2013-11-13: qty 2

## 2013-11-13 MED ORDER — PLASMA-LYTE 148 IV SOLN
INTRAVENOUS | Status: DC
  Filled 2013-11-13: qty 2.5

## 2013-11-13 MED ORDER — CHLORHEXIDINE GLUCONATE 4 % EX LIQD
30.0000 mL | CUTANEOUS | Status: DC
  Filled 2013-11-13: qty 30

## 2013-11-13 MED ORDER — DOPAMINE-DEXTROSE 3.2-5 MG/ML-% IV SOLN
2.0000 ug/kg/min | INTRAVENOUS | Status: DC
  Filled 2013-11-13: qty 250

## 2013-11-13 MED ORDER — CEFUROXIME SODIUM 1.5 G IJ SOLR
1.5000 g | INTRAMUSCULAR | Status: AC
  Administered 2013-11-14: 1.5 g via INTRAVENOUS
  Administered 2013-11-14: .75 g via INTRAVENOUS
  Filled 2013-11-13 (×2): qty 1.5

## 2013-11-13 MED ORDER — DEXMEDETOMIDINE HCL IN NACL 400 MCG/100ML IV SOLN
0.1000 ug/kg/h | INTRAVENOUS | Status: AC
Start: 2013-11-14 — End: 2013-11-14
  Administered 2013-11-14: .3 ug/kg/h via INTRAVENOUS
  Filled 2013-11-13: qty 100

## 2013-11-13 MED ORDER — MAGNESIUM SULFATE 50 % IJ SOLN
40.0000 meq | INTRAMUSCULAR | Status: DC
  Filled 2013-11-13: qty 10

## 2013-11-13 MED ORDER — SODIUM CHLORIDE 0.9 % IV SOLN
INTRAVENOUS | Status: DC
  Filled 2013-11-13: qty 30

## 2013-11-13 MED ORDER — METOPROLOL TARTRATE 12.5 MG HALF TABLET
12.5000 mg | ORAL_TABLET | Freq: Once | ORAL | Status: AC
  Administered 2013-11-14: 12.5 mg via ORAL
  Filled 2013-11-13: qty 1

## 2013-11-13 MED ORDER — AMINOCAPROIC ACID 250 MG/ML IV SOLN
INTRAVENOUS | Status: AC
  Administered 2013-11-14: 69 mL/h via INTRAVENOUS
  Filled 2013-11-13: qty 40

## 2013-11-13 MED ORDER — VANCOMYCIN HCL 10 G IV SOLR
1500.0000 mg | INTRAVENOUS | Status: AC
  Administered 2013-11-14: 1500 mg via INTRAVENOUS
  Filled 2013-11-13: qty 1500

## 2013-11-13 MED ORDER — POTASSIUM CHLORIDE 2 MEQ/ML IV SOLN
80.0000 meq | INTRAVENOUS | Status: DC
  Filled 2013-11-13: qty 40

## 2013-11-13 MED ORDER — DEXTROSE 5 % IV SOLN
0.5000 ug/min | INTRAVENOUS | Status: DC
  Filled 2013-11-13: qty 4

## 2013-11-14 ENCOUNTER — Encounter (HOSPITAL_COMMUNITY): Payer: Medicare Other | Admitting: Anesthesiology

## 2013-11-14 ENCOUNTER — Inpatient Hospital Stay (HOSPITAL_COMMUNITY)
Admission: RE | Admit: 2013-11-14 | Discharge: 2013-11-20 | DRG: 220 | Disposition: A | Discharge status: Home Health | Payer: Medicare Other | Source: Ambulatory Visit | Attending: Surgery | Admitting: Surgery

## 2013-11-14 ENCOUNTER — Inpatient Hospital Stay (HOSPITAL_COMMUNITY): Payer: Medicare Other | Admitting: Anesthesiology

## 2013-11-14 ENCOUNTER — Inpatient Hospital Stay (HOSPITAL_COMMUNITY): Payer: Medicare Other

## 2013-11-14 ENCOUNTER — Encounter (HOSPITAL_COMMUNITY)
Admission: RE | Disposition: A | Discharge status: Home Health | Payer: Medicare Other | Source: Ambulatory Visit | Attending: Surgery

## 2013-11-14 ENCOUNTER — Encounter (HOSPITAL_COMMUNITY): Payer: Self-pay | Admitting: *Deleted

## 2013-11-14 DIAGNOSIS — F17200 Nicotine dependence, unspecified, uncomplicated: Secondary | ICD-10-CM | POA: Diagnosis present

## 2013-11-14 DIAGNOSIS — I712 Thoracic aortic aneurysm, without rupture, unspecified: Principal | ICD-10-CM | POA: Diagnosis present

## 2013-11-14 DIAGNOSIS — Z86711 Personal history of pulmonary embolism: Secondary | ICD-10-CM | POA: Diagnosis not present

## 2013-11-14 DIAGNOSIS — I7121 Aneurysm of the ascending aorta, without rupture: Secondary | ICD-10-CM

## 2013-11-14 DIAGNOSIS — Z7901 Long term (current) use of anticoagulants: Secondary | ICD-10-CM

## 2013-11-14 DIAGNOSIS — F3289 Other specified depressive episodes: Secondary | ICD-10-CM | POA: Diagnosis not present

## 2013-11-14 DIAGNOSIS — T380X5A Adverse effect of glucocorticoids and synthetic analogues, initial encounter: Secondary | ICD-10-CM | POA: Diagnosis not present

## 2013-11-14 DIAGNOSIS — F411 Generalized anxiety disorder: Secondary | ICD-10-CM | POA: Diagnosis present

## 2013-11-14 DIAGNOSIS — Z951 Presence of aortocoronary bypass graft: Secondary | ICD-10-CM | POA: Diagnosis not present

## 2013-11-14 DIAGNOSIS — J9819 Other pulmonary collapse: Secondary | ICD-10-CM | POA: Diagnosis not present

## 2013-11-14 DIAGNOSIS — D72829 Elevated white blood cell count, unspecified: Secondary | ICD-10-CM | POA: Diagnosis not present

## 2013-11-14 DIAGNOSIS — R0989 Other specified symptoms and signs involving the circulatory and respiratory systems: Secondary | ICD-10-CM | POA: Diagnosis not present

## 2013-11-14 DIAGNOSIS — I714 Abdominal aortic aneurysm, without rupture, unspecified: Secondary | ICD-10-CM

## 2013-11-14 DIAGNOSIS — E785 Hyperlipidemia, unspecified: Secondary | ICD-10-CM | POA: Diagnosis present

## 2013-11-14 DIAGNOSIS — D62 Acute posthemorrhagic anemia: Secondary | ICD-10-CM | POA: Diagnosis not present

## 2013-11-14 DIAGNOSIS — Z95828 Presence of other vascular implants and grafts: Secondary | ICD-10-CM

## 2013-11-14 DIAGNOSIS — F329 Major depressive disorder, single episode, unspecified: Secondary | ICD-10-CM | POA: Diagnosis not present

## 2013-11-14 DIAGNOSIS — J90 Pleural effusion, not elsewhere classified: Secondary | ICD-10-CM | POA: Diagnosis not present

## 2013-11-14 DIAGNOSIS — Z4682 Encounter for fitting and adjustment of non-vascular catheter: Secondary | ICD-10-CM | POA: Diagnosis not present

## 2013-11-14 DIAGNOSIS — F259 Schizoaffective disorder, unspecified: Secondary | ICD-10-CM | POA: Diagnosis present

## 2013-11-14 HISTORY — PX: INTRAOPERATIVE TRANSESOPHAGEAL ECHOCARDIOGRAM: SHX5062

## 2013-11-14 HISTORY — PX: ASCENDING AORTIC ROOT REPLACEMENT: SHX5729

## 2013-11-14 LAB — POCT I-STAT 3, ART BLOOD GAS (G3+)
ACID-BASE DEFICIT: 4 mmol/L — AB (ref 0.0–2.0)
ACID-BASE DEFICIT: 4 mmol/L — AB (ref 0.0–2.0)
Acid-base deficit: 1 mmol/L (ref 0.0–2.0)
Acid-base deficit: 7 mmol/L — ABNORMAL HIGH (ref 0.0–2.0)
BICARBONATE: 25.8 meq/L — AB (ref 20.0–24.0)
BICARBONATE: 22.8 meq/L (ref 20.0–24.0)
Bicarbonate: 25.2 mEq/L — ABNORMAL HIGH (ref 20.0–24.0)
Bicarbonate: 21.5 mEq/L (ref 20.0–24.0)
Bicarbonate: 23.1 mEq/L (ref 20.0–24.0)
O2 SAT: 100 %
O2 SAT: 94 %
O2 SAT: 94 %
O2 Saturation: 100 %
O2 Saturation: 98 %
PCO2 ART: 44.9 mmHg (ref 35.0–45.0)
PCO2 ART: 49.8 mmHg — AB (ref 35.0–45.0)
PH ART: 7.357 (ref 7.350–7.450)
PH ART: 7.24 — AB (ref 7.350–7.450)
PO2 ART: 561 mmHg — AB (ref 80.0–100.0)
Patient temperature: 36.3
Patient temperature: 37
Patient temperature: 98.2
TCO2: 27 mmol/L (ref 0–100)
TCO2: 27 mmol/L (ref 0–100)
TCO2: 23 mmol/L (ref 0–100)
TCO2: 24 mmol/L (ref 0–100)
TCO2: 25 mmol/L (ref 0–100)
pCO2 arterial: 45.7 mmHg — ABNORMAL HIGH (ref 35.0–45.0)
pCO2 arterial: 45.4 mmHg — ABNORMAL HIGH (ref 35.0–45.0)
pCO2 arterial: 49 mmHg — ABNORMAL HIGH (ref 35.0–45.0)
pH, Arterial: 7.36 (ref 7.350–7.450)
pH, Arterial: 7.31 — ABNORMAL LOW (ref 7.350–7.450)
pH, Arterial: 7.281 — ABNORMAL LOW (ref 7.350–7.450)
pO2, Arterial: 471 mmHg — ABNORMAL HIGH (ref 80.0–100.0)
pO2, Arterial: 82 mmHg (ref 80.0–100.0)
pO2, Arterial: 106 mmHg — ABNORMAL HIGH (ref 80.0–100.0)
pO2, Arterial: 81 mmHg (ref 80.0–100.0)

## 2013-11-14 LAB — CBC
HCT: 40.4 % (ref 39.0–52.0)
HEMOGLOBIN: 14.3 g/dL (ref 13.0–17.0)
MCH: 34 pg (ref 26.0–34.0)
MCHC: 35.4 g/dL (ref 30.0–36.0)
MCV: 96 fL (ref 78.0–100.0)
Platelets: 158 10*3/uL (ref 150–400)
RBC: 4.21 MIL/uL — AB (ref 4.22–5.81)
RDW: 13.2 % (ref 11.5–15.5)
WBC: 18.6 10*3/uL — AB (ref 4.0–10.5)

## 2013-11-14 LAB — POCT I-STAT, CHEM 8
BUN: 11 mg/dL (ref 6–23)
BUN: 11 mg/dL (ref 6–23)
BUN: 9 mg/dL (ref 6–23)
BUN: 10 mg/dL (ref 6–23)
BUN: 11 mg/dL (ref 6–23)
BUN: 10 mg/dL (ref 6–23)
BUN: 9 mg/dL (ref 6–23)
BUN: 7 mg/dL (ref 6–23)
CALCIUM ION: 1.18 mmol/L (ref 1.12–1.23)
CHLORIDE: 108 meq/L (ref 96–112)
CHLORIDE: 100 meq/L (ref 96–112)
CHLORIDE: 106 meq/L (ref 96–112)
CHLORIDE: 107 meq/L (ref 96–112)
CREATININE: 0.7 mg/dL (ref 0.50–1.35)
CREATININE: 0.7 mg/dL (ref 0.50–1.35)
CREATININE: 0.7 mg/dL (ref 0.50–1.35)
CREATININE: 0.8 mg/dL (ref 0.50–1.35)
Calcium, Ion: 1.27 mmol/L — ABNORMAL HIGH (ref 1.12–1.23)
Calcium, Ion: 1.01 mmol/L — ABNORMAL LOW (ref 1.12–1.23)
Calcium, Ion: 1.21 mmol/L (ref 1.12–1.23)
Calcium, Ion: 1.02 mmol/L — ABNORMAL LOW (ref 1.12–1.23)
Calcium, Ion: 1.03 mmol/L — ABNORMAL LOW (ref 1.12–1.23)
Calcium, Ion: 1.24 mmol/L — ABNORMAL HIGH (ref 1.12–1.23)
Calcium, Ion: 1.18 mmol/L (ref 1.12–1.23)
Chloride: 107 mEq/L (ref 96–112)
Chloride: 104 mEq/L (ref 96–112)
Chloride: 98 mEq/L (ref 96–112)
Chloride: 110 mEq/L (ref 96–112)
Creatinine, Ser: 0.7 mg/dL (ref 0.50–1.35)
Creatinine, Ser: 0.6 mg/dL (ref 0.50–1.35)
Creatinine, Ser: 0.7 mg/dL (ref 0.50–1.35)
Creatinine, Ser: 0.6 mg/dL (ref 0.50–1.35)
GLUCOSE: 95 mg/dL (ref 70–99)
GLUCOSE: 109 mg/dL — AB (ref 70–99)
Glucose, Bld: 93 mg/dL (ref 70–99)
Glucose, Bld: 107 mg/dL — ABNORMAL HIGH (ref 70–99)
Glucose, Bld: 121 mg/dL — ABNORMAL HIGH (ref 70–99)
Glucose, Bld: 122 mg/dL — ABNORMAL HIGH (ref 70–99)
Glucose, Bld: 134 mg/dL — ABNORMAL HIGH (ref 70–99)
Glucose, Bld: 156 mg/dL — ABNORMAL HIGH (ref 70–99)
HCT: 45 % (ref 39.0–52.0)
HCT: 33 % — ABNORMAL LOW (ref 39.0–52.0)
HCT: 41 % (ref 39.0–52.0)
HCT: 28 % — ABNORMAL LOW (ref 39.0–52.0)
HCT: 30 % — ABNORMAL LOW (ref 39.0–52.0)
HCT: 33 % — ABNORMAL LOW (ref 39.0–52.0)
HCT: 42 % (ref 39.0–52.0)
HEMATOCRIT: 30 % — AB (ref 39.0–52.0)
HEMOGLOBIN: 15.3 g/dL (ref 13.0–17.0)
HEMOGLOBIN: 11.2 g/dL — AB (ref 13.0–17.0)
HEMOGLOBIN: 9.5 g/dL — AB (ref 13.0–17.0)
HEMOGLOBIN: 11.2 g/dL — AB (ref 13.0–17.0)
Hemoglobin: 13.9 g/dL (ref 13.0–17.0)
Hemoglobin: 10.2 g/dL — ABNORMAL LOW (ref 13.0–17.0)
Hemoglobin: 10.2 g/dL — ABNORMAL LOW (ref 13.0–17.0)
Hemoglobin: 14.3 g/dL (ref 13.0–17.0)
POTASSIUM: 4.1 meq/L (ref 3.7–5.3)
POTASSIUM: 4.5 meq/L (ref 3.7–5.3)
POTASSIUM: 6.8 meq/L — AB (ref 3.7–5.3)
POTASSIUM: 4.8 meq/L (ref 3.7–5.3)
Potassium: 4 mEq/L (ref 3.7–5.3)
Potassium: 4.1 mEq/L (ref 3.7–5.3)
Potassium: 5.9 mEq/L — ABNORMAL HIGH (ref 3.7–5.3)
Potassium: 4.2 mEq/L (ref 3.7–5.3)
SODIUM: 142 meq/L (ref 137–147)
SODIUM: 142 meq/L (ref 137–147)
SODIUM: 130 meq/L — AB (ref 137–147)
SODIUM: 129 meq/L — AB (ref 137–147)
Sodium: 140 mEq/L (ref 137–147)
Sodium: 134 mEq/L — ABNORMAL LOW (ref 137–147)
Sodium: 138 mEq/L (ref 137–147)
Sodium: 141 mEq/L (ref 137–147)
TCO2: 23 mmol/L (ref 0–100)
TCO2: 23 mmol/L (ref 0–100)
TCO2: 25 mmol/L (ref 0–100)
TCO2: 22 mmol/L (ref 0–100)
TCO2: 23 mmol/L (ref 0–100)
TCO2: 22 mmol/L (ref 0–100)
TCO2: 21 mmol/L (ref 0–100)
TCO2: 22 mmol/L (ref 0–100)

## 2013-11-14 LAB — TYPE AND SCREEN
ABO/RH(D): A POS
ABO/RH(D): A POS
Antibody Screen: NEGATIVE
Antibody Screen: NEGATIVE

## 2013-11-14 LAB — GLUCOSE, CAPILLARY
GLUCOSE-CAPILLARY: 133 mg/dL — AB (ref 70–99)
GLUCOSE-CAPILLARY: 123 mg/dL — AB (ref 70–99)
GLUCOSE-CAPILLARY: 119 mg/dL — AB (ref 70–99)
GLUCOSE-CAPILLARY: 89 mg/dL (ref 70–99)
Glucose-Capillary: 127 mg/dL — ABNORMAL HIGH (ref 70–99)
Glucose-Capillary: 99 mg/dL (ref 70–99)
Glucose-Capillary: 133 mg/dL — ABNORMAL HIGH (ref 70–99)

## 2013-11-14 LAB — HEMOGLOBIN AND HEMATOCRIT, BLOOD
HCT: 31.9 % — ABNORMAL LOW (ref 39.0–52.0)
HEMOGLOBIN: 11.2 g/dL — AB (ref 13.0–17.0)

## 2013-11-14 LAB — POCT I-STAT 4, (NA,K, GLUC, HGB,HCT)
GLUCOSE: 145 mg/dL — AB (ref 70–99)
HCT: 44 % (ref 39.0–52.0)
Hemoglobin: 15 g/dL (ref 13.0–17.0)
POTASSIUM: 4.2 meq/L (ref 3.7–5.3)
SODIUM: 140 meq/L (ref 137–147)

## 2013-11-14 LAB — CREATININE, SERUM
CREATININE: 0.74 mg/dL (ref 0.50–1.35)
GFR calc non Af Amer: >90 mL/min (ref >90)

## 2013-11-14 LAB — PLATELET COUNT: Platelets: 118 10*3/uL — ABNORMAL LOW (ref 150–400)

## 2013-11-14 LAB — MAGNESIUM: Magnesium: 2.6 mg/dL — ABNORMAL HIGH (ref 1.5–2.5)

## 2013-11-14 SURGERY — ECHOCARDIOGRAM, TRANSESOPHAGEAL, INTRAOPERATIVE
Anesthesia: General | Site: Chest

## 2013-11-14 MED ORDER — NITROGLYCERIN IN D5W 200-5 MCG/ML-% IV SOLN: 0.0000 ug/min | INTRAVENOUS | Status: DC

## 2013-11-14 MED ORDER — FENTANYL CITRATE 0.05 MG/ML IJ SOLN
INTRAMUSCULAR | Status: AC
  Filled 2013-11-14: qty 5

## 2013-11-14 MED ORDER — POTASSIUM CHLORIDE 10 MEQ/50ML IV SOLN: 10.0000 meq | INTRAVENOUS | Status: AC

## 2013-11-14 MED ORDER — SODIUM CHLORIDE 0.9 % IJ SOLN
INTRAMUSCULAR | Status: AC
  Filled 2013-11-14: qty 10

## 2013-11-14 MED ORDER — GELATIN ABSORBABLE MT POWD
OROMUCOSAL | Status: DC | PRN
  Administered 2013-11-14 (×3): via TOPICAL

## 2013-11-14 MED ORDER — ONDANSETRON HCL 4 MG/2ML IJ SOLN: 4.0000 mg | Freq: Four times a day (QID) | INTRAMUSCULAR | Status: DC | PRN

## 2013-11-14 MED ORDER — INSULIN REGULAR BOLUS VIA INFUSION
0.0000 [IU] | Freq: Three times a day (TID) | INTRAVENOUS | Status: DC
  Filled 2013-11-14: qty 10

## 2013-11-14 MED ORDER — PROPOFOL 10 MG/ML IV BOLUS
INTRAVENOUS | Status: AC
  Filled 2013-11-14: qty 20

## 2013-11-14 MED ORDER — SODIUM CHLORIDE 0.9 % IJ SOLN
3.0000 mL | Freq: Two times a day (BID) | INTRAMUSCULAR | Status: DC
  Administered 2013-11-15 – 2013-11-17 (×3): 3 mL via INTRAVENOUS

## 2013-11-14 MED ORDER — DOPAMINE-DEXTROSE 3.2-5 MG/ML-% IV SOLN: 0.0000 ug/kg/min | INTRAVENOUS | Status: DC

## 2013-11-14 MED ORDER — LACTATED RINGERS IV SOLN
INTRAVENOUS | Status: DC | PRN
  Administered 2013-11-14 (×2): via INTRAVENOUS

## 2013-11-14 MED ORDER — METOPROLOL TARTRATE 1 MG/ML IV SOLN: 2.5000 mg | INTRAVENOUS | Status: DC | PRN

## 2013-11-14 MED ORDER — MIDAZOLAM HCL 2 MG/2ML IJ SOLN
INTRAMUSCULAR | Status: AC
  Filled 2013-11-14: qty 2

## 2013-11-14 MED ORDER — BISACODYL 5 MG PO TBEC
10.0000 mg | DELAYED_RELEASE_TABLET | Freq: Every day | ORAL | Status: DC
  Administered 2013-11-15 – 2013-11-17 (×3): 10 mg via ORAL
  Filled 2013-11-14 (×3): qty 2

## 2013-11-14 MED ORDER — METHYLPREDNISOLONE SODIUM SUCC 125 MG IJ SOLR
INTRAMUSCULAR | Status: DC | PRN
  Administered 2013-11-14: 250 mg via INTRAVENOUS

## 2013-11-14 MED ORDER — SODIUM CHLORIDE 0.45 % IV SOLN
INTRAVENOUS | Status: DC
  Administered 2013-11-14: 15:00:00 via INTRAVENOUS

## 2013-11-14 MED ORDER — METOPROLOL TARTRATE 12.5 MG HALF TABLET
12.5000 mg | ORAL_TABLET | Freq: Two times a day (BID) | ORAL | Status: DC
  Filled 2013-11-14 (×3): qty 1

## 2013-11-14 MED ORDER — VANCOMYCIN HCL IN DEXTROSE 1-5 GM/200ML-% IV SOLN
1000.0000 mg | Freq: Once | INTRAVENOUS | Status: AC
  Administered 2013-11-14: 1000 mg via INTRAVENOUS
  Filled 2013-11-14: qty 200

## 2013-11-14 MED ORDER — ALBUMIN HUMAN 5 % IV SOLN
INTRAVENOUS | Status: DC | PRN
  Administered 2013-11-14: 14:00:00 via INTRAVENOUS

## 2013-11-14 MED ORDER — ALBUMIN HUMAN 5 % IV SOLN
250.0000 mL | INTRAVENOUS | Status: AC | PRN
  Administered 2013-11-14 (×2): 250 mL via INTRAVENOUS
  Filled 2013-11-14: qty 250

## 2013-11-14 MED ORDER — ROCURONIUM BROMIDE 50 MG/5ML IV SOLN
INTRAVENOUS | Status: AC
  Filled 2013-11-14: qty 1

## 2013-11-14 MED ORDER — DOPAMINE-DEXTROSE 3.2-5 MG/ML-% IV SOLN
INTRAVENOUS | Status: DC | PRN
  Administered 2013-11-14: 5 ug/kg/min via INTRAVENOUS

## 2013-11-14 MED ORDER — ACETAMINOPHEN 160 MG/5ML PO SOLN: 650.0000 mg | Freq: Once | ORAL | Status: AC

## 2013-11-14 MED ORDER — MORPHINE SULFATE 2 MG/ML IJ SOLN
2.0000 mg | INTRAMUSCULAR | Status: DC | PRN
  Administered 2013-11-14: 4 mg via INTRAVENOUS
  Administered 2013-11-15 (×3): 2 mg via INTRAVENOUS
  Administered 2013-11-15 – 2013-11-16 (×2): 4 mg via INTRAVENOUS
  Filled 2013-11-14 (×3): qty 2
  Filled 2013-11-14: qty 1
  Filled 2013-11-14: qty 2

## 2013-11-14 MED ORDER — ASPIRIN 81 MG PO CHEW: 324.0000 mg | CHEWABLE_TABLET | Freq: Every day | ORAL | Status: DC

## 2013-11-14 MED ORDER — MIDAZOLAM HCL 10 MG/2ML IJ SOLN
INTRAMUSCULAR | Status: AC
  Filled 2013-11-14: qty 2

## 2013-11-14 MED ORDER — HEPARIN SODIUM (PORCINE) 1000 UNIT/ML IJ SOLN
INTRAMUSCULAR | Status: AC
  Filled 2013-11-14: qty 1

## 2013-11-14 MED ORDER — ROCURONIUM BROMIDE 100 MG/10ML IV SOLN
INTRAVENOUS | Status: DC | PRN
  Administered 2013-11-14: 30 mg via INTRAVENOUS
  Administered 2013-11-14: 70 mg via INTRAVENOUS
  Administered 2013-11-14: 50 mg via INTRAVENOUS

## 2013-11-14 MED ORDER — DOCUSATE SODIUM 100 MG PO CAPS
200.0000 mg | ORAL_CAPSULE | Freq: Every day | ORAL | Status: DC
  Administered 2013-11-15 – 2013-11-19 (×5): 200 mg via ORAL
  Filled 2013-11-14 (×6): qty 2

## 2013-11-14 MED ORDER — LACTATED RINGERS IV SOLN
INTRAVENOUS | Status: DC | PRN
  Administered 2013-11-14: 07:00:00 via INTRAVENOUS

## 2013-11-14 MED ORDER — SIMVASTATIN 20 MG PO TABS
20.0000 mg | ORAL_TABLET | Freq: Every day | ORAL | Status: DC
  Administered 2013-11-16 – 2013-11-19 (×4): 20 mg via ORAL
  Filled 2013-11-14 (×7): qty 1

## 2013-11-14 MED ORDER — SODIUM CHLORIDE 0.9 % IV SOLN: 250.0000 mL | INTRAVENOUS | Status: DC

## 2013-11-14 MED ORDER — MORPHINE SULFATE 2 MG/ML IJ SOLN: 1.0000 mg | INTRAMUSCULAR | Status: AC | PRN

## 2013-11-14 MED ORDER — SODIUM CHLORIDE 0.9 % IJ SOLN: 3.0000 mL | INTRAMUSCULAR | Status: DC | PRN

## 2013-11-14 MED ORDER — FENTANYL CITRATE 0.05 MG/ML IJ SOLN
INTRAMUSCULAR | Status: DC | PRN
Start: 2013-11-14 — End: 2013-11-14
  Administered 2013-11-14: 50 ug via INTRAVENOUS
  Administered 2013-11-14: 500 ug via INTRAVENOUS
  Administered 2013-11-14: 400 ug via INTRAVENOUS
  Administered 2013-11-14: 100 ug via INTRAVENOUS
  Administered 2013-11-14: 500 ug via INTRAVENOUS
  Administered 2013-11-14: 150 ug via INTRAVENOUS
  Administered 2013-11-14: 50 ug via INTRAVENOUS

## 2013-11-14 MED ORDER — DEXMEDETOMIDINE HCL IN NACL 200 MCG/50ML IV SOLN: 0.1000 ug/kg/h | INTRAVENOUS | Status: DC

## 2013-11-14 MED ORDER — PHENYLEPHRINE HCL 10 MG/ML IJ SOLN
0.0000 ug/min | INTRAMUSCULAR | Status: DC
  Administered 2013-11-15: 20 ug/min via INTRAVENOUS
  Administered 2013-11-15: 45 ug/min via INTRAVENOUS
  Filled 2013-11-14 (×5): qty 2

## 2013-11-14 MED ORDER — SERTRALINE HCL 100 MG PO TABS
100.0000 mg | ORAL_TABLET | Freq: Two times a day (BID) | ORAL | Status: DC
  Administered 2013-11-15 – 2013-11-20 (×11): 100 mg via ORAL
  Filled 2013-11-14 (×12): qty 1

## 2013-11-14 MED ORDER — EZETIMIBE 10 MG PO TABS
10.0000 mg | ORAL_TABLET | Freq: Every day | ORAL | Status: DC
  Administered 2013-11-15 – 2013-11-20 (×6): 10 mg via ORAL
  Filled 2013-11-14 (×6): qty 1

## 2013-11-14 MED ORDER — MIDAZOLAM HCL 5 MG/5ML IJ SOLN
INTRAMUSCULAR | Status: DC | PRN
  Administered 2013-11-14: 2 mg via INTRAVENOUS
  Administered 2013-11-14: 1 mg via INTRAVENOUS
  Administered 2013-11-14: 3 mg via INTRAVENOUS
  Administered 2013-11-14: 2 mg via INTRAVENOUS
  Administered 2013-11-14: 6 mg via INTRAVENOUS

## 2013-11-14 MED ORDER — BISACODYL 10 MG RE SUPP: 10.0000 mg | Freq: Every day | RECTAL | Status: DC

## 2013-11-14 MED ORDER — ROCURONIUM BROMIDE 50 MG/5ML IV SOLN
INTRAVENOUS | Status: AC
  Filled 2013-11-14: qty 2

## 2013-11-14 MED ORDER — SODIUM CHLORIDE 0.9 % IV SOLN: INTRAVENOUS | Status: DC

## 2013-11-14 MED ORDER — HEMOSTATIC AGENTS (NO CHARGE) OPTIME
TOPICAL | Status: DC | PRN
  Administered 2013-11-14 (×2): 1 via TOPICAL

## 2013-11-14 MED ORDER — ASPIRIN EC 325 MG PO TBEC
325.0000 mg | DELAYED_RELEASE_TABLET | Freq: Every day | ORAL | Status: DC
  Administered 2013-11-15 – 2013-11-20 (×6): 325 mg via ORAL
  Filled 2013-11-14 (×6): qty 1

## 2013-11-14 MED ORDER — ACETAMINOPHEN 500 MG PO TABS
1000.0000 mg | ORAL_TABLET | Freq: Four times a day (QID) | ORAL | Status: DC
  Administered 2013-11-15 – 2013-11-17 (×5): 1000 mg via ORAL
  Filled 2013-11-14 (×12): qty 2

## 2013-11-14 MED ORDER — MAGNESIUM SULFATE 40 MG/ML IJ SOLN
INTRAMUSCULAR | Status: AC
  Filled 2013-11-14: qty 100

## 2013-11-14 MED ORDER — LACTATED RINGERS IV SOLN: INTRAVENOUS | Status: DC

## 2013-11-14 MED ORDER — SODIUM CHLORIDE 0.9 % IV SOLN
INTRAVENOUS | Status: DC
  Filled 2013-11-14 (×2): qty 2.5

## 2013-11-14 MED ORDER — OXYCODONE HCL 5 MG PO TABS
5.0000 mg | ORAL_TABLET | ORAL | Status: DC | PRN
  Administered 2013-11-14: 5 mg via ORAL
  Administered 2013-11-15 – 2013-11-16 (×7): 10 mg via ORAL
  Administered 2013-11-16: 5 mg via ORAL
  Administered 2013-11-17 – 2013-11-20 (×10): 10 mg via ORAL
  Filled 2013-11-14 (×7): qty 2
  Filled 2013-11-14: qty 1
  Filled 2013-11-14 (×5): qty 2
  Filled 2013-11-14: qty 1
  Filled 2013-11-14 (×5): qty 2

## 2013-11-14 MED ORDER — ARTIFICIAL TEARS OP OINT
TOPICAL_OINTMENT | OPHTHALMIC | Status: AC
  Filled 2013-11-14: qty 3.5

## 2013-11-14 MED ORDER — EPHEDRINE SULFATE 50 MG/ML IJ SOLN
INTRAMUSCULAR | Status: AC
  Filled 2013-11-14: qty 1

## 2013-11-14 MED ORDER — PANTOPRAZOLE SODIUM 40 MG PO TBEC
40.0000 mg | DELAYED_RELEASE_TABLET | Freq: Every day | ORAL | Status: DC
  Administered 2013-11-16 – 2013-11-20 (×5): 40 mg via ORAL
  Filled 2013-11-14 (×6): qty 1

## 2013-11-14 MED ORDER — LACTATED RINGERS IV SOLN
INTRAVENOUS | Status: DC | PRN
  Administered 2013-11-14 (×2): via INTRAVENOUS

## 2013-11-14 MED ORDER — 0.9 % SODIUM CHLORIDE (POUR BTL) OPTIME
TOPICAL | Status: DC | PRN
  Administered 2013-11-14: 6000 mL

## 2013-11-14 MED ORDER — ACETAMINOPHEN 650 MG RE SUPP
650.0000 mg | Freq: Once | RECTAL | Status: AC
  Administered 2013-11-14: 650 mg via RECTAL

## 2013-11-14 MED ORDER — ACETAMINOPHEN 160 MG/5ML PO SOLN
1000.0000 mg | Freq: Four times a day (QID) | ORAL | Status: DC
  Filled 2013-11-14: qty 40

## 2013-11-14 MED ORDER — MIDAZOLAM HCL 2 MG/2ML IJ SOLN: 2.0000 mg | INTRAMUSCULAR | Status: DC | PRN

## 2013-11-14 MED ORDER — METOPROLOL TARTRATE 25 MG/10 ML ORAL SUSPENSION
12.5000 mg | Freq: Two times a day (BID) | ORAL | Status: DC
  Filled 2013-11-14 (×3): qty 5

## 2013-11-14 MED ORDER — PROPOFOL 10 MG/ML IV BOLUS
INTRAVENOUS | Status: DC | PRN
  Administered 2013-11-14: 100 mg via INTRAVENOUS
  Administered 2013-11-14: 250 mg via INTRAVENOUS

## 2013-11-14 MED ORDER — HEPARIN SODIUM (PORCINE) 1000 UNIT/ML IJ SOLN
INTRAMUSCULAR | Status: DC | PRN
  Administered 2013-11-14: 32000 [IU] via INTRAVENOUS

## 2013-11-14 MED ORDER — MAGNESIUM SULFATE 4000MG/100ML IJ SOLN
4.0000 g | Freq: Once | INTRAMUSCULAR | Status: AC
  Administered 2013-11-14: 4 g via INTRAVENOUS

## 2013-11-14 MED ORDER — FAMOTIDINE IN NACL 20-0.9 MG/50ML-% IV SOLN
20.0000 mg | Freq: Two times a day (BID) | INTRAVENOUS | Status: AC
  Administered 2013-11-14: 20 mg via INTRAVENOUS

## 2013-11-14 MED ORDER — PROTAMINE SULFATE 10 MG/ML IV SOLN
INTRAVENOUS | Status: DC | PRN
  Administered 2013-11-14: 300 mg via INTRAVENOUS

## 2013-11-14 MED ORDER — DEXTROSE 5 % IV SOLN
1.5000 g | Freq: Two times a day (BID) | INTRAVENOUS | Status: AC
  Administered 2013-11-14 – 2013-11-16 (×4): 1.5 g via INTRAVENOUS
  Filled 2013-11-14 (×4): qty 1.5

## 2013-11-14 MED ORDER — DEXMEDETOMIDINE HCL IN NACL 400 MCG/100ML IV SOLN
0.1000 ug/kg/h | INTRAVENOUS | Status: DC
  Filled 2013-11-14: qty 100

## 2013-11-14 MED ORDER — LACTATED RINGERS IV SOLN
500.0000 mL | Freq: Once | INTRAVENOUS | Status: AC | PRN
Start: 2013-11-14 — End: 2013-11-14

## 2013-11-14 SURGICAL SUPPLY — 93 items
ADAPTER CARDIO PERF ANTE/RETRO (ADAPTER) ×4 IMPLANT
ADAPTER MULTI PERFUSION 15 (ADAPTER) ×4 IMPLANT
APPLICATOR TIP COSEAL (VASCULAR PRODUCTS) ×8 IMPLANT
ATTRACTOMAT 16X20 MAGNETIC DRP (DRAPES) ×4 IMPLANT
BAG DECANTER FOR FLEXI CONT (MISCELLANEOUS) ×4 IMPLANT
BLADE STERNUM SYSTEM 6 (BLADE) ×4 IMPLANT
BLADE SURG 15 STRL LF DISP TIS (BLADE) ×2 IMPLANT
BLADE SURG 15 STRL SS (BLADE) ×2
CANISTER SUCTION 2500CC (MISCELLANEOUS) ×4 IMPLANT
CANNULA DLP CORONARY OST 12FR (MISCELLANEOUS) ×4 IMPLANT
CANNULA GUNDRY RCSP 15FR (MISCELLANEOUS) ×12 IMPLANT
CATH HEART VENT LEFT (CATHETERS) ×2 IMPLANT
CATH ROBINSON RED A/P 18FR (CATHETERS) ×16 IMPLANT
CATH THORACIC 36FR (CATHETERS) ×4 IMPLANT
CATH THORACIC 36FR RT ANG (CATHETERS) ×4 IMPLANT
CAUTERY HIGH TEMP VAS (MISCELLANEOUS) ×4 IMPLANT
CAUTERY SURG HI TEMP FINE TIP (MISCELLANEOUS) ×4 IMPLANT
CONT SPEC 4OZ CLIKSEAL STRL BL (MISCELLANEOUS) ×8 IMPLANT
CONT SPEC STER OR (MISCELLANEOUS) ×4 IMPLANT
COVER SURGICAL LIGHT HANDLE (MISCELLANEOUS) ×4 IMPLANT
CRADLE DONUT ADULT HEAD (MISCELLANEOUS) ×4 IMPLANT
DRAPE SLUSH/WARMER DISC (DRAPES) ×4 IMPLANT
DRSG COVADERM 4X14 (GAUZE/BANDAGES/DRESSINGS) ×4 IMPLANT
ELECT CAUTERY BLADE 6.4 (BLADE) ×4 IMPLANT
ELECT REM PT RETURN 9FT ADLT (ELECTROSURGICAL) ×8
ELECTRODE REM PT RTRN 9FT ADLT (ELECTROSURGICAL) ×4 IMPLANT
GAUZE SPONGE 4X4 12PLY STRL (GAUZE/BANDAGES/DRESSINGS) ×4 IMPLANT
GLOVE BIO SURGEON STRL SZ 6 (GLOVE) ×4 IMPLANT
GLOVE BIO SURGEON STRL SZ 6.5 (GLOVE) IMPLANT
GLOVE BIO SURGEON STRL SZ7 (GLOVE) ×8 IMPLANT
GLOVE BIO SURGEON STRL SZ7.5 (GLOVE) ×4 IMPLANT
GLOVE BIO SURGEONS STRL SZ 6.5 (GLOVE)
GLOVE EUDERMIC 7 POWDERFREE (GLOVE) ×12 IMPLANT
GOWN STRL REUS W/ TWL LRG LVL3 (GOWN DISPOSABLE) ×12 IMPLANT
GOWN STRL REUS W/ TWL XL LVL3 (GOWN DISPOSABLE) ×2 IMPLANT
GOWN STRL REUS W/TWL LRG LVL3 (GOWN DISPOSABLE) ×12
GOWN STRL REUS W/TWL XL LVL3 (GOWN DISPOSABLE) ×2
GRAFT GELWEAVE VALSALVA 30CM (Prosthesis & Implant Heart) ×4 IMPLANT
GRAFT HEMASHIELD 30X10 (Vascular Products) ×4 IMPLANT
HEART VENT LT CURVED (MISCELLANEOUS) ×4 IMPLANT
HEMOSTAT POWDER SURGIFOAM 1G (HEMOSTASIS) ×12 IMPLANT
HEMOSTAT SURGICEL 2X14 (HEMOSTASIS) ×4 IMPLANT
INSERT FOGARTY XLG (MISCELLANEOUS) ×4 IMPLANT
KIT BASIN OR (CUSTOM PROCEDURE TRAY) ×4 IMPLANT
KIT CATH CPB BARTLE (MISCELLANEOUS) ×4 IMPLANT
KIT ROOM TURNOVER OR (KITS) ×4 IMPLANT
KIT SUCTION CATH 14FR (SUCTIONS) ×4 IMPLANT
LINE EXTENSION DELIVERY (MISCELLANEOUS) ×4 IMPLANT
LINE VENT (MISCELLANEOUS) ×4 IMPLANT
NS IRRIG 1000ML POUR BTL (IV SOLUTION) ×24 IMPLANT
PACK OPEN HEART (CUSTOM PROCEDURE TRAY) ×4 IMPLANT
PAD ARMBOARD 7.5X6 YLW CONV (MISCELLANEOUS) ×8 IMPLANT
SEALANT SURG COSEAL 8ML (VASCULAR PRODUCTS) ×4 IMPLANT
SET CARDIOPLEGIA MPS 5001102 (MISCELLANEOUS) ×4 IMPLANT
STOPCOCK 4 WAY LG BORE MALE ST (IV SETS) ×4 IMPLANT
SUT BONE WAX W31G (SUTURE) ×4 IMPLANT
SUT ETHIBON 2 0 V 52N 30 (SUTURE) ×12 IMPLANT
SUT ETHIBON EXCEL 2-0 V-5 (SUTURE) ×12 IMPLANT
SUT ETHIBOND V-5 VALVE (SUTURE) ×12 IMPLANT
SUT PROLENE 3 0 SH 1 (SUTURE) ×4 IMPLANT
SUT PROLENE 3 0 SH DA (SUTURE) ×8 IMPLANT
SUT PROLENE 3 0 SH1 36 (SUTURE) ×4 IMPLANT
SUT PROLENE 4 0 RB 1 (SUTURE) ×22
SUT PROLENE 4 0 SH DA (SUTURE) ×8 IMPLANT
SUT PROLENE 4-0 RB1 .5 CRCL 36 (SUTURE) ×22 IMPLANT
SUT PROLENE 5 0 C 1 36 (SUTURE) ×8 IMPLANT
SUT PROLENE 5 0 RB 2 (SUTURE) ×8 IMPLANT
SUT PROLENE 6 0 C 1 30 (SUTURE) ×20 IMPLANT
SUT SILK 2 0 SH CR/8 (SUTURE) ×4 IMPLANT
SUT STEEL 6MS V (SUTURE) IMPLANT
SUT STEEL STERNAL CCS#1 18IN (SUTURE) IMPLANT
SUT STEEL SZ 6 DBL 3X14 BALL (SUTURE) ×12 IMPLANT
SUT VIC AB 1 CTX 27 (SUTURE) ×4 IMPLANT
SUT VIC AB 1 CTX 36 (SUTURE) ×4
SUT VIC AB 1 CTX36XBRD ANBCTR (SUTURE) ×4 IMPLANT
SUT VIC AB 2-0 CT1 27 (SUTURE)
SUT VIC AB 2-0 CT1 TAPERPNT 27 (SUTURE) IMPLANT
SUT VIC AB 3-0 X1 27 (SUTURE) IMPLANT
SUTURE E-PAK OPEN HEART (SUTURE) ×4 IMPLANT
SYSTEM SAHARA CHEST DRAIN ATS (WOUND CARE) ×4 IMPLANT
TAPE CLOTH SURG 4X10 WHT LF (GAUZE/BANDAGES/DRESSINGS) ×4 IMPLANT
TAPE PAPER 2X10 WHT MICROPORE (GAUZE/BANDAGES/DRESSINGS) ×4 IMPLANT
TOWEL OR 17X24 6PK STRL BLUE (TOWEL DISPOSABLE) ×8 IMPLANT
TOWEL OR 17X26 10 PK STRL BLUE (TOWEL DISPOSABLE) ×8 IMPLANT
TRAY FOLEY IC TEMP SENS 14FR (CATHETERS) IMPLANT
TRAY FOLEY IC TEMP SENS 16FR (CATHETERS) ×4 IMPLANT
TUBE CONNECTING 12'X1/4 (SUCTIONS) ×1
TUBE CONNECTING 12X1/4 (SUCTIONS) ×3 IMPLANT
TUBE SUCT INTRACARD DLP 20F (MISCELLANEOUS) ×4 IMPLANT
UNDERPAD 30X30 INCONTINENT (UNDERPADS AND DIAPERS) ×4 IMPLANT
VENT LEFT HEART 12002 (CATHETERS) ×4
WATER STERILE IRR 1000ML POUR (IV SOLUTION) ×8 IMPLANT
YANKAUER SUCT BULB TIP NO VENT (SUCTIONS) ×4 IMPLANT

## 2013-11-14 NOTE — Procedures (Signed)
Extubation Procedure Note  Patient Details:   Name: Ian Grimes Emile DOB: 1971/02/10 MRN: [id number]   Airway Documentation:     Evaluation  O2 sats: stable throughout Complications: No apparent complications Patient did tolerate procedure well. Bilateral Breath Sounds: Clear   Yes  Blanchie Serve 11/14/2013, 6:29 PM

## 2013-11-14 NOTE — Transfer of Care (Signed)
Immediate Anesthesia Transfer of Care Note  Patient: Ian Grimes  Procedure(s) Performed: Procedure(s): INTRAOPERATIVE TRANSESOPHAGEAL ECHOCARDIOGRAM (N/A) Value sparing Root replacement, ASCENDING AORTIC ROOT REPLACEMENT, circ Arrest (N/A)  Patient Location: PACU  Anesthesia Type:General  Level of Consciousness: Patient remains intubated per anesthesia plan  Airway & Oxygen Therapy: Patient remains intubated per anesthesia plan and Patient placed on Ventilator (see vital sign flow sheet for setting)  Post-op Assessment: Report given to PACU RN  Post vital signs: Reviewed and stable  Complications: No apparent anesthesia complications

## 2013-11-14 NOTE — H&P (Signed)
[address]       [place] [address]             [phone]      Cardiothoracic Surgery History and Physical   PCP is Redge Gainer, MD  Referring Provider is Brand Males, MD   Chief Complaint   Patient presents with   .  Thoracic Aortic Aneurysm     Surgical eval, Chest CT 08/07/13     HPI:  The patient is a 43 year old smoker with a history of Schizo-affective disorder on disability for mental health who presented in early March 2015 with a few week history of shortness of breath. He had a chest xray showing a small nodule. He was treated with prednisone and amoxicillin without improvement and a week later had a CT of the chest which showed bilateral pulmonary emboli. It also showed a 52mm x 6 mm triangular subpleural nodule in the lateral right middle lobe corresponding to the nodule seen on CXR. He was admitted and started on heparin and converted to Xarelto which he is still taking. He had a follow up CT scan of the chest on 08/07/2013 that showed resolution of the pulmonary nodule. He did not have any contrast so no evaluation of the pulmonary emboli could be made. It was noted that he had fusiform dilatation of the ascending aorta to 4.9 cm. In retrospect this was present on the CTA from March but was not commented on. It appeared that it may have enlarged slightly compared to the scan in March.   Past Medical History   Diagnosis  Date   .  Schizo-affective psychosis    .  Anxiety    .  Depression    .  HA (headache)    .  Hyperlipidemia    .  Pulmonary emboli    .  Pulmonary nodule      follow up CT 6 months (due 9/15)   .  Pleural effusion     Past Surgical History   Procedure  Laterality  Date   .  Rib injury      Family History   Problem  Relation  Age of Onset   .  Heart disease  Father    .  Stroke  Father    .  COPD  Paternal 72    .  Asthma  Paternal Aunt    .  Stroke  Paternal Uncle    .  Cancer  Father      lung   Social  History  History   Substance Use Topics   .  Smoking status:  Current Every Day Smoker -- 1.00 packs/day for 22 years     Types:  Cigarettes   .  Smokeless tobacco:  Never Used   .  Alcohol Use:  Yes      Comment: drinks 1 can of beer/month.    Current Outpatient Prescriptions   Medication  Sig  Dispense  Refill   .  clonazePAM (KLONOPIN) 1 MG tablet  Take 1 mg by mouth 3 (three) times daily.     Marland Kitchen  ezetimibe (ZETIA) 10 MG tablet  TAKE 1 TABLET ONCE A DAY  30 tablet  5   .  fenofibrate micronized (ANTARA) 130 MG capsule  Take 1 capsule (130 mg total) by mouth daily before breakfast.  30 capsule  3   .  pravastatin (PRAVACHOL) 40 MG tablet  Take 1 tablet (40  mg total) by mouth daily.  90 tablet  3   .  rivaroxaban (XARELTO) 20 MG TABS tablet  Take 1 tablet (20 mg total) by mouth daily with supper.  30 tablet  5   .  sertraline (ZOLOFT) 100 MG tablet  Take 1 tablet (100 mg total) by mouth 2 (two) times daily.  30 tablet  0   .  traZODone (DESYREL) 100 MG tablet  Take 100 mg by mouth at bedtime.      No current facility-administered medications for this visit.    Allergies   Allergen  Reactions   .  Bactrim  Rash     Review of Systems   Constitutional: Negative for fever, chills, activity change and fatigue.  HENT: Negative.  Sees his dentist every 6 months.  Eyes: Negative.  Respiratory: Negative.  Cardiovascular: Negative.  Gastrointestinal: Negative.  Endocrine: Negative.  Genitourinary: Negative.  Musculoskeletal: Negative.  Skin: Negative.  Allergic/Immunologic: Negative.  Neurological: Negative.  Hematological: Negative.  Psychiatric/Behavioral:  Schizo-affective disorder    BP 125/88  Pulse 100  Resp 20  Ht 6' (1.829 m)  Wt 210 lb (95.255 kg)  BMI 28.47 kg/m2  SpO2 96%  Physical Exam   Constitutional: He is oriented to person, place, and time. He appears well-developed and well-nourished. No distress.  HENT:  Head: Normocephalic and atraumatic.  Multiple  missing teeth. Extensive plaque on teeth.  Eyes: EOM are normal. Pupils are equal, round, and reactive to light.  Neck: Normal range of motion. No JVD present. No thyromegaly present.  Cardiovascular: Normal rate, regular rhythm, normal heart sounds and intact distal pulses.  No murmur heard.  Pulmonary/Chest: Effort normal and breath sounds normal. No respiratory distress. He has no rales.  Abdominal: Soft. Bowel sounds are normal. He exhibits no distension and no mass. There is no tenderness.  Musculoskeletal: He exhibits no edema and no tenderness.  Lymphadenopathy:  He has no cervical adenopathy.  Neurological: He is alert and oriented to person, place, and time. He has normal strength. No cranial nerve deficit or sensory deficit.  Skin: Skin is warm and dry.  Multiple tatoos  Psychiatric: He has a normal mood and affect.   Diagnostic Tests:   CLINICAL DATA: Follow up pulmonary nodules demonstrated on prior  CT. Smoker. No history of malignancy.  EXAM:  CT CHEST WITHOUT CONTRAST  TECHNIQUE:  Multidetector CT imaging of the chest was performed following the  standard protocol without IV contrast.  COMPARISON: Radiographs 04/30/2013. CT 05/06/2013.  FINDINGS:  There are stable small mediastinal and hilar lymph nodes. Allowing  for the limitations of noncontrast technique, no enlarged  mediastinal, hilar or axillary lymph nodes are identified.  The ascending aorta is dilated to 4.9 cm. No displaced intimal  calcifications are identified. There is no evidence of mediastinal  hematoma, and only minimal atherosclerotic calcification is present.  The heart size is normal. There is no pleural or pericardial  effusion.  The lungs demonstrate mild centrilobular and paraseptal emphysema.  Previously demonstrated small nodule near the right costophrenic  angle has resolved, likely representing atelectasis. There is no  residual ground-glass density. Minimal subpleural scarring is    present in the left lower lobe on image 37. There are no suspicious  pulmonary nodules.  There are no suspicious osseous findings. There is a mild scoliosis.  The visualized upper abdomen appears unremarkable.  IMPRESSION:  1. No suspicious pulmonary nodules. Subpleural density previously  noted at the right lung base has resolved, likely  atelectasis or  sequela of pulmonary embolism demonstrated on prior examination.  2. Mild centrilobular and paraseptal emphysema.  3. Ascending aortic aneurysm appears mildly enlarged compared with  the prior study. Of note, previously demonstrated pulmonary embolism  is not addressed on this noncontrast study.  Electronically Signed  By: Camie Patience M.D.  On: 08/07/2013 12:30     *[facility]* [address], [place] [address] [phone]  ------------------------------------------------------------------- Transthoracic Echocardiography  Patient: Ian, Stephenson Grimes MR #: [id number] Study Date: 09/18/2013 Gender: M Age: 19 Height: 182.9 cm Weight: 95.3 kg BSA: 2.22 m^2 Pt. Status: Room:  Sadler, MD Baldwin, MD SONOGRAPHER Cindy Hazy, RDCS ATTENDING Sanda Klein, MD PERFORMING [facility], Outpatient  cc:  ------------------------------------------------------------------- LV EF: 55% - 60%  ------------------------------------------------------------------- Indications: Thoracic Aortic Aneurysm 441.2  ------------------------------------------------------------------- History: PMH: Pulmonary Embolism. Ascending Aortic Aneurysm. Risk factors: Current tobacco use. Dyslipidemia.  ------------------------------------------------------------------- Study Conclusions  - Left ventricle: The cavity size was normal. Wall thickness was normal. Systolic function was normal. The estimated ejection fraction was in the range of 55% to 60%. Wall motion was normal; there were no regional wall  motion abnormalities. Left ventricular diastolic function parameters were normal. - Aortic valve: Bicuspid. - Ascending aorta: The ascending aorta was moderately dilated. - Atrial septum: No defect or patent foramen ovale was identified.  Transthoracic echocardiography. M-mode, complete 2D, spectral Doppler, and color Doppler. Birthdate: Patient birthdate: [birth year]. Age: Patient is 43 yr old. Sex: Gender: male. Height: Height: 182.9 cm. Height: 72 in. Weight: Weight: 95.3 kg. Weight: 209.6 lb. Body mass index: BMI: 28.5 kg/m^2. Body surface area: BSA: 2.22 m^2. Blood pressure: 102/71 Patient status: Outpatient. Study date: Study date: 09/18/2013. Study time: 09:37 AM. Location: [facility]  -------------------------------------------------------------------  ------------------------------------------------------------------- Left ventricle: The cavity size was normal. Wall thickness was normal. Systolic function was normal. The estimated ejection fraction was in the range of 55% to 60%. Wall motion was normal; there were no regional wall motion abnormalities. The transmitral flow pattern was normal. The deceleration time of the early transmitral flow velocity was normal. The pulmonary vein flow pattern was normal. The tissue Doppler parameters were normal. Left ventricular diastolic function parameters were normal. There was no evidence of elevated ventricular filling pressure by Doppler parameters.  ------------------------------------------------------------------- Aortic valve: Bicuspid. Doppler: There was no stenosis. There was no significant regurgitation.  ------------------------------------------------------------------- Aorta: The aorta was without evidence of coarctation. Ascending aorta: The ascending aorta was moderately dilated.  ------------------------------------------------------------------- Mitral valve: Structurally normal valve. Leaflet separation  was normal. Doppler: Transvalvular velocity was within the normal range. There was no evidence for stenosis. There was trivial regurgitation. Peak gradient (D): 3 mm Hg.  ------------------------------------------------------------------- Left atrium: The atrium was normal in size.  ------------------------------------------------------------------- Atrial septum: No defect or patent foramen ovale was identified.  ------------------------------------------------------------------- Right ventricle: The cavity size was normal. Wall thickness was normal. Systolic function was normal.  ------------------------------------------------------------------- Pulmonic valve: The valve appears to be grossly normal. Doppler: There was no significant regurgitation.  ------------------------------------------------------------------- Tricuspid valve: Structurally normal valve. Leaflet separation was normal. Doppler: Transvalvular velocity was within the normal range. There was no regurgitation.  ------------------------------------------------------------------- Pulmonary artery: Systolic pressure could not be accurately estimated.  ------------------------------------------------------------------- Right atrium: The atrium was normal in size.  ------------------------------------------------------------------- Pericardium: There was no pericardial effusion.  ------------------------------------------------------------------- Systemic veins: Inferior vena cava: The vessel was normal in size. The respirophasic diameter changes were in the normal range (= 50%), consistent with normal central venous pressure.  -------------------------------------------------------------------  Post procedure conclusions Ascending Aorta:  - The aorta was without evidence of coarctation.  ------------------------------------------------------------------- Prepared and Electronically Authenticated  by  Sanda Klein, MD 2015-07-22T13:11:07  ------------------------------------------------------------------- Measurements  Left ventricle Value Reference LV ID, ED, PLAX chordal (N) 50 mm 43 - 52 LV ID, ES, PLAX chordal (N) 27 mm 23 - 38 LV fx shortening, PLAX chordal (N) 46 % >=29 LV PW thickness, ED 8 mm --------- IVS/LV PW ratio, ED (N) 1 <=1.3 Stroke volume, 2D 94 ml --------- Stroke volume/bsa, 2D 42 ml/m^2 --------- LV e&', lateral 10.9 cm/s --------- LV E/e&', lateral 8.06 --------- LV e&', medial 8.05 cm/s --------- LV E/e&', medial 10.91 --------- LV e&', average 9.48 cm/s --------- LV E/e&', average 9.27 ---------  Ventricular septum Value Reference IVS thickness, ED 8 mm ---------  LVOT Value Reference LVOT ID, S 24 mm --------- LVOT area 4.52 cm^2 --------- LVOT VTI, S 20.9 cm ---------  Aorta Value Reference Aortic root ID, ED 38 mm --------- Ascending aorta ID, A-P, S 48 mm ---------  Left atrium Value Reference LA ID, A-P, ES 33 mm --------- LA ID/bsa, A-P (N) 1.49 cm/m^2 <=2.2  Mitral valve Value Reference Mitral E-wave peak velocity 87.8 cm/s --------- Mitral A-wave peak velocity 63.4 cm/s --------- Mitral deceleration time (N) 197 ms 150 - 230 Mitral peak gradient, D 3 mm Hg --------- Mitral E/A ratio, peak 1.4 ---------  Right ventricle Value Reference RV s&', lateral, S 12.4 cm/s ---------  Legend: (L) and (H) mark values outside specified reference range.  (N) marks values inside specified reference range.   Catheterization  Indication: Pre AVR Aortic Aneurysm associated with bicuspid AV  Procedure: After informed consent and clinical "time out" the right groin was prepped and draped in a sterile fashion. A 5Fr sheath was placed in the right femoral artery using seldinger technique and local lidocaine. Standard JL4, JR4 and angled pigtail catheters were used to engage the coronary arteries. Coronary arteries were visualized in  orthogonal views using caudal and cranial angulation. RAO ventriculography was done using 28* cc of contrast. LAO Aortography done using 40 cc contrast. Right heart cath done from RFV using 6 Fr catheter  Medications:  Versed: 4 mg's Fentanyl: 50 ug's  Coronary Arteries:  Right dominant with no anomalies  LM: Normal   LAD: Normal  IM: Normal  D1: Normal  Circumflex: Normal  OM1: Normal  OM2: Normal  OM3: Distal AV goove normal  RCA: Normal Dominant  PDA: Normal  PLA: Normal  Ventriculography: EF: 65 %, Pull back gradient only 20 mmHg  Aortography: Severe ascending aortic root dilatation with no significant AR  Hemodynamics:  Aortic Pressure: 105 68 mmHg LV Pressure :85 54 MmHg  Right Heart:  Mean RA 4 mmHg  RV: 25/2 mmHg  PA: 21/8 mean 13 mmHg  PCWP: 11 mmHg  CO: 2.61 Liters/min Seems low  Impression: Normal coronary arteries Bicuspid AV with mild AS. Severe aortic root dilatation. Normal right heart pressures F/U Dr Georgia Dom [facility]  11/01/2013  9:43 AM   Impression:   He has a fusiform ascending aortic aneurysm with a maximum diameter of 4.9 cm. I have reviewed both of his CT scans myself and it appears that this aneurysm may have enlarged 2 mm since March 2015. The diameter of his descending aorta at the same level is only 21 mm so I think 4.9 cm is very significant and warrants repair in this 43 year old gentleman. He has a bicuspid aortic valve that has no  stenosis or regurgitation on echo and looks good. Cardiac cath shows no coronary disease. I will evaluate the valve in the OR and plan a valve- sparing root replacement if possible to avoid the need for a mechanical valve and coumadin in this young patient. The mid term results with sparing a functional bicuspid valve are excellent. I discussed valve- sparing root replacement with him and using a mechanical valve if replacement is needed. He understands the need for long term coumadin with a mechanical valve. He is in  agreement with that plan. I discussed the operative procedure with the patient  including alternatives, benefits and risks; including but not limited to bleeding, blood transfusion, infection, stroke, myocardial infarction, graft failure, heart block requiring a permanent pacemaker, organ dysfunction, and death.  Ian Grimes understands and agrees to proceed.    Plan:  Valve-sparing aortic root replacement and replacement of the ascending aortic aneurysm, possible mechanical aortic valve replacement.

## 2013-11-14 NOTE — Progress Notes (Signed)
  Echocardiogram Echocardiogram Transesophageal has been performed.  Ian Grimes, Ian Grimes 11/14/2013, 8:45 AM

## 2013-11-14 NOTE — Brief Op Note (Addendum)
      [address]       [place] [address]             [phone]     11/14/2013  12:50 PM  PATIENT:  Ian Grimes  43 y.o. male  PRE-OPERATIVE DIAGNOSIS:  ascending aortic aneurysm  POST-OPERATIVE DIAGNOSIS:  Ascending Aortic Aneurysm  PROCEDURE:  Procedure(s): INTRAOPERATIVE TRANSESOPHAGEAL ECHOCARDIOGRAM VALVE SPARING  ASCENDING AORTIC ROOT REPLACEMENT WITH , 30 MM GELWEAVE AND 30 MM HEMASHIELD DACRON, BENTALL PROCEDURE  SURGEON:  Surgeon(s): Gaye Pollack, MD  PHYSICIAN ASSISTANT: Berthel Bagnall PA-C  ANESTHESIA:   general  PATIENT CONDITION:  ICU - intubated and hemodynamically stable.  PRE-OPERATIVE WEIGHT: 90kg  EBL SEE ANEST FLOW SHEET  COMPLICATIONS: NO KNOWN  Aortic Valve Etiology   Aortic Insufficiency:  Mild  Aortic Valve Disease:  Yes.  Aortic Stenosis:  No.  Etiology (Choose at least one and up to  5 etiologies):  Bicuspid valve disease and Primary Aortic Disease, Atherosclerotic Aneurysm  Aortic Valve  Procedure Performed:  Replacement: No.  Repair/Reconstruction: No.   Aortic Annular Enlargement: No.

## 2013-11-14 NOTE — Progress Notes (Signed)
Patient was extubated with no complications. Patient was able to cough and speak after extubation. Placed patient on a 2LNC, sats are 97. Patient passed NIF and VC with a NIF of -60 and a VC of 1L. Patient is doing well at this time.

## 2013-11-14 NOTE — Op Note (Signed)
CARDIOVASCULAR SURGERY OPERATIVE NOTE  11/11/2013  Surgeon:  Gaye Pollack, MD  First Assistant: Jadene Pierini, PA-C   Preoperative Diagnosis:  Bicuspid aortic valve with a 4.9 cm enlarging aortic root and ascending aortic aneurysm    Postoperative Diagnosis:  Same   Procedure:  1. Median Sternotomy 2. Extracorporeal circulation 3.   Valve-sparing aortic root replacement with reimplantation of the native aortic valve and left and right coronary arteries ( 30 mm Gelweave valsalva graft). 4.   Replacement of ascending aortic aneurysm with a 30 mm Hemashield graft using deep hypothermic circulatory arrest.   Anesthesia:  General Endotracheal   Clinical History/Surgical Indication:  The patient is a 43 year old smoker with a history of Schizo-affective disorder on disability for mental health who presented in early March 2015 with a few week history of shortness of breath. He had a chest xray showing a small nodule. He was treated with prednisone and amoxicillin without improvement and a week later had a CT of the chest which showed bilateral pulmonary emboli. It also showed a 48mm x 6 mm triangular subpleural nodule in the lateral right middle lobe corresponding to the nodule seen on CXR. He was admitted and started on heparin and converted to Xarelto which he is still taking. He had a follow up CT scan of the chest on 08/07/2013 that showed resolution of the pulmonary nodule. He did not have any contrast so no evaluation of the pulmonary emboli could be made. It was noted that he had fusiform dilatation of the ascending aorta to 4.9 cm. In retrospect this was present on the CTA from March but was not commented on. It appeared that it may have enlarged slightly compared to the scan in March. He has a fusiform ascending aortic aneurysm with a maximum diameter of 4.9 cm. I have reviewed both of his CT scans myself and it appears that this aneurysm may have enlarged 2 mm since March 2015. The  diameter of his descending aorta at the same level is only 21 mm so I think 4.9 cm is very significant and warrants repair in this 43 year old gentleman. He has a bicuspid aortic valve that has no stenosis or regurgitation on echo and looks good. Cardiac cath shows no coronary disease. I will evaluate the valve in the OR and plan a valve- sparing root replacement if possible to avoid the need for a mechanical valve and coumadin in this young patient. The mid term results with sparing a functional bicuspid valve are excellent. I discussed valve- sparing root replacement with him and using a mechanical valve if replacement is needed. He understands the need for long term coumadin with a mechanical valve. He is in agreement with that plan. I discussed the operative procedure with the patient including alternatives, benefits and risks; including but not limited to bleeding, blood transfusion, infection, stroke, myocardial infarction, graft failure, heart block requiring a permanent pacemaker, organ dysfunction, and death. Ian Grimes understands and agrees to proceed.     Preparation:  The patient was seen in the preoperative holding area and the correct patient, correct operation were confirmed with the patient after reviewing the medical record and catheterization. The consent was signed by me. Preoperative antibiotics were given. A pulmonary arterial line and radial arterial line were placed by the anesthesia team. The patient was taken back to the operating room and positioned supine on the operating room table. After being placed under general endotracheal anesthesia by the anesthesia team a  foley catheter was placed. The neck, chest, abdomen, and both legs were prepped with betadine soap and solution and draped in the usual sterile manner. A surgical time-out was taken and the correct patient and operative procedure were confirmed with the nursing and anesthesia staff.    Pre-bypass TEE:  Performed by  Dr. Tamela Gammon.    This showed a bicuspid  aortic valve with mobile leaflets with no stenosis or insufficiency.  LV function was normal. There was no MR. There was a tiny PFO.   Cardiopulmonary Bypass:  A median sternotomy was performed. The pericardium was opened in the midline. Right ventricular function appeared normal. The ascending aorta was aneurysmal and had no palpable plaque. There were no contraindications to aortic cannulation or cross-clamping. The patient was fully systemically heparinized and the ACT was maintained > 400 sec. The distal ascending aorta was cannulated with a 20 F aortic cannula for arterial inflow. Venous cannulation was performed via the right atrial appendage using a two-staged venous cannula. An antegrade cardioplegia/vent cannula was inserted into the mid-ascending aorta. Aortic occlusion was performed with a single cross-clamp.    Resection and grafting of ascending aortic aneurysm:  The patient was placed on cardiopulmonary bypass and a left ventricular vent was placed via the right superior pulmonary vein. Systemic cooling was begun with a goal temperature of 18 degrees centigrade by bladder and rectal temperature probes. A retrograde cardioplegia cannula was placed through the right atrium into the coronary sinus without difficulty. A retrograde cerebral perfusion cannula was placed into the SVC through a pursestring suture and the SVC was encircled with a silastic tape. While the patient was cooling the aorta was crossclamped and 750 cc of cold blood antegrade cardioplegia was given into the aorta with quick arrest of the heart. The LIMA graft to the LAD was performed. After 30 minutes of cooling the target temperature of 18 degrees centigrade was reached. Cerebral oximetry was 87% bilaterally. BIS was zero. The patient was given 100 mg Propofol and 125 mg of Solumedrol. The head was packed in ice. The bed was placed in steep trendelenburg. Circulatory arrest was  begun and the blood volume emptied into the venous reservoir. Continuous retrograde cerebral perfusion was begun and the SVC occluded with the silastic tape. Cold blood retrograde cardioplegia was given and myocardial temperature dropped to 10 degrees centigrade. Additional doses of cold blood retrograde cardioplegia and direct coronary ostial cold blood cardiolplegia were given at approximately 20 minute intervals throughout the period of circulatory arrest and cross-clamping. Complete diastolic arrest was maintained. The aortic cannula was removed. The aorta was transected just proximal to the innominate artery beveling the resection out along the undersurface of the aortic arch (Hemiarch replacement). The aortic diameter was measured at 30 mm here. A 30 x 10 mm Hemasheild Platinum vascular graft was prepared. ( Catalog # [id number], Lot # [id number]). It was anastomosed to the aortic arch in an end to end manner using 3-0 prolene continuous suture with a felt strip to reinforce the anastomisis. A light coating of CoSeal was applied to seal needle holes. The arterial end of the bypass circuit was then connected to the 14mm side arm graft and circulation was slowly resumed. The tape was removed from the SVC. The aortic graft was cross-clamped proximal to the side arm graft and full CPB support was resumed. Circulatory arrest time was 19 min. Retrograde cerebral perfusion time was 19 min.   Valve sparing root replacement:   The ascending aorta was  mobilized from the right pulmonary artery and main PA. It was opened longitudinally to a point 2 cm above the STJ and the valve inspected. There were 2 leaflets that were mildly thickened and pliable. There was no calcium. There did not appear to be any prolapse. There was a rudimentary third commissure between what would usually be the left and right cusps but there was no raphe and a single uniform cusp anteriorly. The right and left coronary arteries were removed from  the aortic root with a button of aortic wall around the ostia. They were retracted carefully out of the way with stay sutures to prevent rotation. Stay sutures were placed at each commissure. The plane between the aorta and the surrounding structures was dissected using electrocautery down to level just below the nadir of the valve cusps. The excess aortic sinus tissue was excised leaving a 5 mm rim above the annulus. A 2-0 Ethibond horizontal mattress suture was placed below the nadir of each cusp from the ventricular side to outside the aorta. Two additional sutures were placed beneath each commissure so that all 4 sutures were in the same plane. The optimal STJ diameter was determined using a valve sizer and a 30 mm Gelweave Valsalva graft chosen ( Cat # [id number], SN [id number]). The proximal skirt of the graft was shortened to 3 rings. The 4 sub-annular sutures were placed through the skirt at the proper position. The commissures were brought inside the graft and the graft lowered into place. The sub-annular sutures were tied. The position of the commissures were determined and they were held in place using pledgetted 4-0 prolene mattress sutures through the graft from inside to outside. Then the distal valve suture line was performed using 4-0 prolene sutures beginning at the nadir of each cusp and tieing the sutures together at the top of the commissures outside the graft. This resulted in a single line of closure of the valve which appeared completely competent. Small openings were made in the graft for the coronary anastomoses using a thermal cautery. Then the left and right coronary buttons were anastomosed to the graft in an end to side manner using continuous 5-0 prolene suture. A light coating of CoSeal was applied to each anastomosis for hemostasis. The two grafts were then cut to the appropriate length and anastomosed end to end using continuous 3-0 prolene suture. CoSeal was applied to seal the  needle holes in the grafts. The proximal and distal aortic grafts were then cut to the appropriate length and anastomosed using continuous 4-0 prolene suture. A vent cannula was placed into the graft to remove any air. Deairing maneuvers were performed and the bed placed in trendelenburg position.   Completion:  The patient was rewarmed to 37 degrees Centigrade.  The crossclamp was removed with a time of 188 minutes. There was spontaneous return of slow junctiona rhythm. The anastomoses were checked for hemostasis. The position of the grafts was satisfactory. The vascular anastomoses all appeared hemostatic. Two temporary epicardial pacing wires were placed on the right atrium and two on the right ventricle.  The LV vent and retrograde cannula were removed. The patient was weaned from CPB without difficulty on low dose dopamine. CPB time was 233 minutes. Cardiac output was 7 LPM. Heparin was fully reversed with protamine. The 10 mm side arm arterial perfusion graft was ligatted with a heavy silk tie and suture ligatted with 3-0 prolene and the venous cannula removed. Hemostasis was achieved. Mediastinal and left pleural drainage tubes  were placed. The sternum was closed with single and double #6 stainless steel wires. The fascia was closed with continuous # 1 vicryl suture. The subcutaneous tissue was closed with 2-0 vicryl continuous suture. The skin was closed with 3-0 vicryl subcuticular suture. All sponge, needle, and instrument counts were reported correct at the end of the Morrissette. Dry sterile dressings were placed over the incisions and around the chest tubes which were connected to pleurevac suction. The patient was then transported to the surgical intensive care unit in critical but stable condition.   Post-bypass TEE:  Normal LV and RV function. Normal aortic leaflet motion and closure. There was a tiny wisp of central AI that is insignificant. No stenosis. No MR.

## 2013-11-14 NOTE — Progress Notes (Signed)
Utilization review completed. Hanne Kegg, RN, BSN. 

## 2013-11-14 NOTE — Anesthesia Postprocedure Evaluation (Signed)
  Anesthesia Post-op Note  Patient: Ian Grimes  Procedure(s) Performed: Procedure(s): INTRAOPERATIVE TRANSESOPHAGEAL ECHOCARDIOGRAM (N/A) Value sparing Root replacement, ASCENDING AORTIC ROOT REPLACEMENT, circ Arrest (N/A)  Patient Location: SICU  Anesthesia Type:General  Level of Consciousness: Patient remains intubated per anesthesia plan  Airway and Oxygen Therapy: Patient remains intubated per anesthesia plan and Patient placed on Ventilator (see vital sign flow sheet for setting)  Post-op Pain: none  Post-op Assessment: Post-op Vital signs reviewed, Patient's Cardiovascular Status Stable and Respiratory Function Stable  Post-op Vital Signs: Reviewed and stable  Last Vitals:  Filed Vitals:   11/14/13 1406  BP: 116/63  Pulse: 83  Temp:   Resp: 12    Complications: No apparent anesthesia complications

## 2013-11-14 NOTE — Interval H&P Note (Signed)
History and Physical Interval Note:  11/14/2013 7:04 AM  Ian Grimes  has presented today for surgery, with the diagnosis of ascending aortic aneurysm  The various methods of treatment have been discussed with the patient and family. After consideration of risks, benefits and other options for treatment, the patient has consented to  Procedure(s): INTRAOPERATIVE TRANSESOPHAGEAL ECHOCARDIOGRAM (N/A) Value sparing Root replacement, ASCENDING AORTIC ROOT REPLACEMENT, circ Arrest (N/A) as a surgical intervention .  The patient's history has been reviewed, patient examined, no change in status, stable for surgery.  I have reviewed the patient's chart and labs.  Questions were answered to the patient's satisfaction.     Gaye Pollack

## 2013-11-14 NOTE — OR Nursing (Signed)
Second call to SICU 

## 2013-11-14 NOTE — Anesthesia Preprocedure Evaluation (Addendum)
Anesthesia Evaluation  Patient identified by MRN, date of birth, ID band Patient awake    Reviewed: Allergy & Precautions, H&P , NPO status , Patient's Chart, lab work & pertinent test results  History of Anesthesia Complications Negative for: history of anesthetic complications  Airway Mallampati: II TM Distance: >3 FB Neck ROM: Full    Dental  (+) Missing, Teeth Intact   Pulmonary Current Smoker,  1 ppdx 29 years + rhonchi         Cardiovascular Rhythm:Regular Rate:Normal  Aotic root aneurysm,  Bicuspid aortic valve.  LVF  WNL  55-60%   Neuro/Psych Anxiety Depression Schizophrenia Sinus headaches    GI/Hepatic negative GI ROS, Neg liver ROS,   Endo/Other  negative endocrine ROS  Renal/GU negative Renal ROS     Musculoskeletal negative musculoskeletal ROS (+)   Abdominal   Peds  Hematology negative hematology ROS (+)   Anesthesia Other Findings   Reproductive/Obstetrics                          Anesthesia Physical Anesthesia Plan  ASA: IV  Anesthesia Plan: General   Post-op Pain Management:    Induction: Intravenous  Airway Management Planned: Oral ETT  Additional Equipment: Arterial line, CVP, PA Cath, TEE and 3D TEE  Intra-op Plan: Utilization of Controlled Hypotension per surrgeon request, Utilization Of Total Body Hypothermia per surgeon request and Delibrate Circulatory arrest per surgeon request  Post-operative Plan: Post-operative intubation/ventilation  Informed Consent: I have reviewed the patients History and Physical, chart, labs and discussed the procedure including the risks, benefits and alternatives for the proposed anesthesia with the patient or authorized representative who has indicated his/her understanding and acceptance.   Dental advisory given  Plan Discussed with: CRNA, Surgeon and Anesthesiologist  Anesthesia Plan Comments:        Anesthesia  Quick Evaluation

## 2013-11-14 NOTE — Anesthesia Procedure Notes (Addendum)
Procedure Name: Intubation Date/Time: 11/14/2013 7:54 AM Performed by: Izora Gala Pre-anesthesia Checklist: Patient identified, Emergency Drugs available, Suction available and Patient being monitored Patient Re-evaluated:Patient Re-evaluated prior to inductionOxygen Delivery Method: Circle system utilized Preoxygenation: Pre-oxygenation with 100% oxygen Intubation Type: IV induction Laryngoscope Size: Miller and 3 Grade View: Grade I Tube type: Oral Tube size: 8.5 mm Number of attempts: 1 Airway Equipment and Method: Stylet Placement Confirmation: ETT inserted through vocal cords under direct vision,  positive ETCO2 and breath sounds checked- equal and bilateral Secured at: 23 cm Tube secured with: Tape Dental Injury: Teeth and Oropharynx as per pre-operative assessment       The patient was identified and consent obtained.  TO was performed, and full barrier precautions were used.  The skin was anesthetized with lidocaine.  Once the vein was located with the 22 ga. needle using ultrasound guidance , the wire was inserted into the vein.  The wire location was confirmed with ultrasound.  The insertion site was dilated and the introducer was carefully inserted and sutured in place. The PAC was checked, and floated into the PA.  Once in the PA, the catheter was secured. The patient tolerated the procedure well.  CXR was ordered for PACU. Start: 9983 End: 3825 J. Tedra Senegal, MD

## 2013-11-14 NOTE — OR Nursing (Signed)
First Call to SICU.

## 2013-11-14 NOTE — Progress Notes (Signed)
[facility] BRIEF SICU PROGRESS NOTE  Day of Surgery  S/P Procedure(s) (LRB): INTRAOPERATIVE TRANSESOPHAGEAL ECHOCARDIOGRAM (N/A) Value sparing Root replacement, ASCENDING AORTIC ROOT REPLACEMENT, circ Arrest (N/A)   Extubated uneventfully, neuro grossly intact AAI paced w/ stable hemodynamics on low dose dopamine and neo drips, PA pressures low UOP excellent Chest tube output minimal  Plan: Continue routine early postop  Dannon Perlow H 11/14/2013 7:01 PM

## 2013-11-15 ENCOUNTER — Inpatient Hospital Stay (HOSPITAL_COMMUNITY): Payer: Medicare Other

## 2013-11-15 LAB — GLUCOSE, CAPILLARY
GLUCOSE-CAPILLARY: 108 mg/dL — AB (ref 70–99)
GLUCOSE-CAPILLARY: 120 mg/dL — AB (ref 70–99)
GLUCOSE-CAPILLARY: 124 mg/dL — AB (ref 70–99)
GLUCOSE-CAPILLARY: 89 mg/dL (ref 70–99)
GLUCOSE-CAPILLARY: 91 mg/dL (ref 70–99)
Glucose-Capillary: 95 mg/dL (ref 70–99)

## 2013-11-15 LAB — POCT I-STAT, CHEM 8
BUN: 11 mg/dL (ref 6–23)
Calcium, Ion: 1.16 mmol/L (ref 1.12–1.23)
Chloride: 105 mEq/L (ref 96–112)
Creatinine, Ser: 0.8 mg/dL (ref 0.50–1.35)
GLUCOSE: 125 mg/dL — AB (ref 70–99)
HCT: 34 % — ABNORMAL LOW (ref 39.0–52.0)
HEMOGLOBIN: 11.6 g/dL — AB (ref 13.0–17.0)
Potassium: 4.1 mEq/L (ref 3.7–5.3)
Sodium: 135 mEq/L — ABNORMAL LOW (ref 137–147)
TCO2: 25 mmol/L (ref 0–100)

## 2013-11-15 LAB — BASIC METABOLIC PANEL
ANION GAP: 8 (ref 5–15)
BUN: 8 mg/dL (ref 6–23)
CO2: 25 meq/L (ref 19–32)
Calcium: 7.9 mg/dL — ABNORMAL LOW (ref 8.4–10.5)
Chloride: 107 mEq/L (ref 96–112)
Creatinine, Ser: 0.69 mg/dL (ref 0.50–1.35)
GFR calc Af Amer: >90 mL/min (ref >90)
GFR calc non Af Amer: >90 mL/min (ref >90)
GLUCOSE: 109 mg/dL — AB (ref 70–99)
POTASSIUM: 4.2 meq/L (ref 3.7–5.3)
SODIUM: 140 meq/L (ref 137–147)

## 2013-11-15 LAB — CBC
HCT: 37.4 % — ABNORMAL LOW (ref 39.0–52.0)
HCT: 33.7 % — ABNORMAL LOW (ref 39.0–52.0)
HEMOGLOBIN: 12.8 g/dL — AB (ref 13.0–17.0)
HEMOGLOBIN: 11.9 g/dL — AB (ref 13.0–17.0)
MCH: 32.6 pg (ref 26.0–34.0)
MCH: 33.7 pg (ref 26.0–34.0)
MCHC: 34.2 g/dL (ref 30.0–36.0)
MCHC: 35.3 g/dL (ref 30.0–36.0)
MCV: 95.2 fL (ref 78.0–100.0)
MCV: 95.5 fL (ref 78.0–100.0)
PLATELETS: 159 10*3/uL (ref 150–400)
PLATELETS: 159 10*3/uL (ref 150–400)
RBC: 3.93 MIL/uL — AB (ref 4.22–5.81)
RBC: 3.53 MIL/uL — AB (ref 4.22–5.81)
RDW: 13.1 % (ref 11.5–15.5)
RDW: 13.5 % (ref 11.5–15.5)
WBC: 21.5 10*3/uL — AB (ref 4.0–10.5)
WBC: 24.1 10*3/uL — AB (ref 4.0–10.5)

## 2013-11-15 LAB — MAGNESIUM
MAGNESIUM: 2.3 mg/dL (ref 1.5–2.5)
MAGNESIUM: 2.2 mg/dL (ref 1.5–2.5)

## 2013-11-15 LAB — CREATININE, SERUM
CREATININE: 0.75 mg/dL (ref 0.50–1.35)
GFR calc Af Amer: >90 mL/min (ref >90)

## 2013-11-15 MED ORDER — INSULIN ASPART 100 UNIT/ML ~~LOC~~ SOLN
0.0000 [IU] | SUBCUTANEOUS | Status: DC
  Administered 2013-11-15: 2 [IU] via SUBCUTANEOUS

## 2013-11-15 MED ORDER — INSULIN ASPART 100 UNIT/ML ~~LOC~~ SOLN: 0.0000 [IU] | SUBCUTANEOUS | Status: DC

## 2013-11-15 MED ORDER — ENOXAPARIN SODIUM 40 MG/0.4ML ~~LOC~~ SOLN
40.0000 mg | Freq: Every day | SUBCUTANEOUS | Status: DC
  Administered 2013-11-15 – 2013-11-19 (×5): 40 mg via SUBCUTANEOUS
  Filled 2013-11-15 (×6): qty 0.4

## 2013-11-15 MED FILL — Lidocaine HCl IV Inj 20 MG/ML: INTRAVENOUS | Qty: 5 | Status: AC

## 2013-11-15 MED FILL — Heparin Sodium (Porcine) Inj 1000 Unit/ML: INTRAMUSCULAR | Qty: 60 | Status: AC

## 2013-11-15 MED FILL — Electrolyte-R (PH 7.4) Solution: INTRAVENOUS | Qty: 4000 | Status: AC

## 2013-11-15 MED FILL — Calcium Chloride Inj 10%: INTRAVENOUS | Qty: 10 | Status: AC

## 2013-11-15 MED FILL — Sodium Chloride IV Soln 0.9%: INTRAVENOUS | Qty: 4000 | Status: AC

## 2013-11-15 MED FILL — Sodium Bicarbonate IV Soln 8.4%: INTRAVENOUS | Qty: 100 | Status: AC

## 2013-11-15 MED FILL — Mannitol IV Soln 20%: INTRAVENOUS | Qty: 500 | Status: AC

## 2013-11-15 NOTE — Progress Notes (Signed)
1 Day Post-Op Procedure(s) (LRB): INTRAOPERATIVE TRANSESOPHAGEAL ECHOCARDIOGRAM (N/A) Value sparing Root replacement, ASCENDING AORTIC ROOT REPLACEMENT, circ Arrest (N/A) Subjective:  Sore and tired but feels ok overall  Objective: Vital signs in last 24 hours: Temp:  [97.3 F (36.3 C)-99.1 F (37.3 C)] 99.1 F (37.3 C) (09/18 0800) Pulse Rate:  [69-90] 90 (09/18 0800) Cardiac Rhythm:  [-] Atrial paced (09/18 0800) Resp:  [11-22] 20 (09/18 0800) BP: (107-116)/(58-63) 107/58 mmHg (09/17 1540) SpO2:  [90 %-100 %] 98 % (09/18 0800) Arterial Line BP: (79-117)/(43-63) 91/44 mmHg (09/18 0800) FiO2 (%):  [40 %-50 %] 40 % (09/17 1800) Weight:  [91.808 kg (202 lb 6.4 oz)] 91.808 kg (202 lb 6.4 oz) (09/18 0500)  Hemodynamic parameters for last 24 hours: PAP: (20-31)/(5-17) 23/10 mmHg CO:  [4.1 L/min-5.9 L/min] 5.9 L/min CI:  [1.9 L/min/m2-2.8 L/min/m2] 2.8 L/min/m2  Intake/Output from previous day: 09/17 0701 - 09/18 0700 In: 6750.5 [P.O.:270; I.V.:4444.5; Blood:856; NG/GT:30; IV Piggyback:1150] Out: 8921 [Urine:7025; Blood:1430; Chest Tube:330] Intake/Output this shift: Total I/O In: 106.7 [I.V.:56.7; IV Piggyback:50] Out: 130 [Urine:100; Chest Tube:30]  General appearance: alert and cooperative Neurologic: intact Heart: regular rate and rhythm, S1, S2 normal, no murmur, click, rub or gallop Lungs: clear to auscultation bilaterally Extremities: extremities normal, atraumatic, no cyanosis or edema Wound: dressing dry  Lab Results:  Recent Labs  11/14/13 2030 11/14/13 2048 11/15/13 0430  WBC 18.6*  --  21.5*  HGB 14.3 14.3 12.8*  HCT 40.4 42.0 37.4*  PLT 158  --  159   BMET:  Recent Labs  11/12/13 1408  11/14/13 2048 11/15/13 0430  NA 138  < > 141 140  K 4.5  < > 4.2 4.2  CL 105  < > 107 107  CO2 14*  --   --  25  GLUCOSE 106*  < > 156* 109*  BUN 15  < > 7 8  CREATININE 0.84  < > 0.80 0.69  CALCIUM 9.3  --   --  7.9*  < > = values in this interval not  displayed.  PT/INR:  Recent Labs  11/12/13 1408  LABPROT 13.1  INR 0.99   ABG    Component Value Date/Time   PHART 7.281* 11/14/2013 2048   HCO3 23.1 11/14/2013 2048   TCO2 25 11/14/2013 2048   TCO2 22 11/14/2013 2048   ACIDBASEDEF 4.0* 11/14/2013 2048   O2SAT 94.0 11/14/2013 2048   CBG (last 3)   Recent Labs  11/15/13 0014 11/15/13 0104 11/15/13 0330  GLUCAP 89 95 108*    Assessment/Plan: S/P Procedure(s) (LRB): INTRAOPERATIVE TRANSESOPHAGEAL ECHOCARDIOGRAM (N/A) Value sparing Root replacement, ASCENDING AORTIC ROOT REPLACEMENT, circ Arrest (N/A)  Hemodynamically stable. Wean off neo and dopamine as BP allows Leukocytosis secondary to steroids for circ arrest Expected acute blood loss anemia: observe Mobilize d/c tubes/lines Continue foley due to patient in ICU and urinary output monitoring See progression orders    LOS: 1 day    Rainee Sweatt K 11/15/2013

## 2013-11-15 NOTE — Progress Notes (Signed)
POD # 1 valve sparing root replacement  resting comfortably  BP 94/60  Pulse 90  Temp(Src) 98.1 F (36.7 C) (Oral)  Resp 25  Ht 6' (1.829 m)  Wt 202 lb 6.4 oz (91.808 kg)  BMI 27.44 kg/m2  SpO2 97%   Intake/Output Summary (Last 24 hours) at 11/15/13 1709 Last data filed at 11/15/13 1300  Gross per 24 hour  Intake 2310.35 ml  Output   3065 ml  Net -754.65 ml   CB well controlled  PM labs pending

## 2013-11-16 ENCOUNTER — Inpatient Hospital Stay (HOSPITAL_COMMUNITY): Payer: Medicare Other

## 2013-11-16 LAB — CBC
HCT: 31.5 % — ABNORMAL LOW (ref 39.0–52.0)
HEMOGLOBIN: 10.7 g/dL — AB (ref 13.0–17.0)
MCH: 32.6 pg (ref 26.0–34.0)
MCHC: 34 g/dL (ref 30.0–36.0)
MCV: 96 fL (ref 78.0–100.0)
Platelets: 138 10*3/uL — ABNORMAL LOW (ref 150–400)
RBC: 3.28 MIL/uL — AB (ref 4.22–5.81)
RDW: 13.6 % (ref 11.5–15.5)
WBC: 19.4 10*3/uL — ABNORMAL HIGH (ref 4.0–10.5)

## 2013-11-16 LAB — GLUCOSE, CAPILLARY
Glucose-Capillary: 102 mg/dL — ABNORMAL HIGH (ref 70–99)
Glucose-Capillary: 100 mg/dL — ABNORMAL HIGH (ref 70–99)
Glucose-Capillary: 127 mg/dL — ABNORMAL HIGH (ref 70–99)
Glucose-Capillary: 107 mg/dL — ABNORMAL HIGH (ref 70–99)
Glucose-Capillary: 111 mg/dL — ABNORMAL HIGH (ref 70–99)

## 2013-11-16 LAB — BASIC METABOLIC PANEL
Anion gap: 9 (ref 5–15)
BUN: 12 mg/dL (ref 6–23)
CHLORIDE: 101 meq/L (ref 96–112)
CO2: 25 mEq/L (ref 19–32)
CREATININE: 0.75 mg/dL (ref 0.50–1.35)
Calcium: 8.1 mg/dL — ABNORMAL LOW (ref 8.4–10.5)
GFR calc Af Amer: >90 mL/min (ref >90)
GFR calc non Af Amer: >90 mL/min (ref >90)
GLUCOSE: 106 mg/dL — AB (ref 70–99)
POTASSIUM: 4.2 meq/L (ref 3.7–5.3)
Sodium: 135 mEq/L — ABNORMAL LOW (ref 137–147)

## 2013-11-16 MED ORDER — PHENYLEPHRINE HCL 10 MG/ML IJ SOLN
0.0000 ug/min | INTRAVENOUS | Status: DC
  Filled 2013-11-16: qty 2

## 2013-11-16 MED ORDER — CLONAZEPAM 1 MG PO TABS
1.0000 mg | ORAL_TABLET | Freq: Three times a day (TID) | ORAL | Status: DC
  Administered 2013-11-16 – 2013-11-20 (×12): 1 mg via ORAL
  Filled 2013-11-16 (×12): qty 1

## 2013-11-16 MED ORDER — INSULIN ASPART 100 UNIT/ML ~~LOC~~ SOLN
0.0000 [IU] | Freq: Three times a day (TID) | SUBCUTANEOUS | Status: DC
  Administered 2013-11-16: 2 [IU] via SUBCUTANEOUS

## 2013-11-16 MED ORDER — PSEUDOEPHEDRINE HCL 30 MG PO TABS
30.0000 mg | ORAL_TABLET | Freq: Three times a day (TID) | ORAL | Status: DC | PRN
  Administered 2013-11-16: 30 mg via ORAL
  Filled 2013-11-16: qty 1

## 2013-11-16 NOTE — Progress Notes (Signed)
2 Days Post-Op Procedure(s) (LRB): INTRAOPERATIVE TRANSESOPHAGEAL ECHOCARDIOGRAM (N/A) Value sparing Root replacement, ASCENDING AORTIC ROOT REPLACEMENT, circ Arrest (N/A) Subjective: C/o headache, some incisional discomfort  Objective: Vital signs in last 24 hours: Temp:  [98.1 F (36.7 C)-99.5 F (37.5 C)] 99.1 F (37.3 C) (09/19 0400) Pulse Rate:  [89-91] 90 (09/19 0700) Cardiac Rhythm:  [-] Atrial paced (09/19 0600) Resp:  [19-40] 19 (09/19 0700) BP: (86-107)/(48-70) 86/48 mmHg (09/19 0700) SpO2:  [88 %-99 %] 98 % (09/19 0700) Arterial Line BP: (58-121)/(43-62) 110/47 mmHg (09/19 0700) Weight:  [202 lb 2.6 oz (91.7 kg)] 202 lb 2.6 oz (91.7 kg) (09/19 0500)  Hemodynamic parameters for last 24 hours: PAP: (27-32)/(12-19) 27/12 mmHg  Intake/Output from previous day: 09/18 0701 - 09/19 0700 In: 1688.6 [P.O.:360; I.V.:1228.6; IV Piggyback:100] Out: 1985 [Urine:1895; Chest Tube:90] Intake/Output this shift:    General appearance: alert and no distress Neurologic: intact Heart: regular rate and rhythm Lungs: diminished breath sounds bibasilar Abdomen: normal findings: soft, non-tender  Lab Results:  Recent Labs  11/15/13 1700 11/15/13 1804 11/16/13 0408  WBC 24.1*  --  19.4*  HGB 11.9* 11.6* 10.7*  HCT 33.7* 34.0* 31.5*  PLT 159  --  138*   BMET:  Recent Labs  11/15/13 0430  11/15/13 1804 11/16/13 0408  NA 140  --  135* 135*  K 4.2  --  4.1 4.2  CL 107  --  105 101  CO2 25  --   --  25  GLUCOSE 109*  --  125* 106*  BUN 8  --  11 12  CREATININE 0.69  < > 0.80 0.75  CALCIUM 7.9*  --   --  8.1*  < > = values in this interval not displayed.  PT/INR: No results found for this basename: LABPROT, INR,  in the last 72 hours ABG    Component Value Date/Time   PHART 7.281* 11/14/2013 2048   HCO3 23.1 11/14/2013 2048   TCO2 25 11/15/2013 1804   ACIDBASEDEF 4.0* 11/14/2013 2048   O2SAT 94.0 11/14/2013 2048   CBG (last 3)   Recent Labs  11/15/13 1208  11/15/13 1549 11/16/13 0354  GLUCAP 124* 120* 100*    Assessment/Plan: S/P Procedure(s) (LRB): INTRAOPERATIVE TRANSESOPHAGEAL ECHOCARDIOGRAM (N/A) Value sparing Root replacement, ASCENDING AORTIC ROOT REPLACEMENT, circ Arrest (N/A) POD # 2 CV- in SR, BP remains relatively low- wean neo as tolerated  RESP- IS for basilar atelectasis  RENAL- near preop weight, lytes and creatinine OK  ENDO- CBG OK, CBG/SSI Ac and HS  mobilize   LOS: 2 days    Lalena Salas C 11/16/2013

## 2013-11-16 NOTE — Progress Notes (Signed)
C/o headache  BP 107/57  Pulse 89  Temp(Src) 98 F (36.7 C) (Oral)  Resp 32  Ht 6' (1.829 m)  Wt 202 lb 2.6 oz (91.7 kg)  BMI 27.41 kg/m2  SpO2 94%   Intake/Output Summary (Last 24 hours) at 11/16/13 1744 Last data filed at 11/16/13 1700  Gross per 24 hour  Intake 1426.7 ml  Output   2075 ml  Net -648.3 ml    Off neo

## 2013-11-17 ENCOUNTER — Inpatient Hospital Stay (HOSPITAL_COMMUNITY): Payer: Medicare Other

## 2013-11-17 LAB — BASIC METABOLIC PANEL
Anion gap: 9 (ref 5–15)
BUN: 15 mg/dL (ref 6–23)
CHLORIDE: 107 meq/L (ref 96–112)
CO2: 25 meq/L (ref 19–32)
Calcium: 8.3 mg/dL — ABNORMAL LOW (ref 8.4–10.5)
Creatinine, Ser: 0.8 mg/dL (ref 0.50–1.35)
GFR calc Af Amer: >90 mL/min (ref >90)
GFR calc non Af Amer: >90 mL/min (ref >90)
Glucose, Bld: 89 mg/dL (ref 70–99)
Potassium: 3.9 mEq/L (ref 3.7–5.3)
Sodium: 141 mEq/L (ref 137–147)

## 2013-11-17 LAB — CBC
HEMATOCRIT: 29.8 % — AB (ref 39.0–52.0)
Hemoglobin: 10.1 g/dL — ABNORMAL LOW (ref 13.0–17.0)
MCH: 32.6 pg (ref 26.0–34.0)
MCHC: 33.9 g/dL (ref 30.0–36.0)
MCV: 96.1 fL (ref 78.0–100.0)
Platelets: 137 10*3/uL — ABNORMAL LOW (ref 150–400)
RBC: 3.1 MIL/uL — AB (ref 4.22–5.81)
RDW: 13.4 % (ref 11.5–15.5)
WBC: 13.4 10*3/uL — AB (ref 4.0–10.5)

## 2013-11-17 LAB — GLUCOSE, CAPILLARY
GLUCOSE-CAPILLARY: 111 mg/dL — AB (ref 70–99)
Glucose-Capillary: 114 mg/dL — ABNORMAL HIGH (ref 70–99)
Glucose-Capillary: 115 mg/dL — ABNORMAL HIGH (ref 70–99)
Glucose-Capillary: 124 mg/dL — ABNORMAL HIGH (ref 70–99)

## 2013-11-17 MED ORDER — SODIUM CHLORIDE 0.9 % IJ SOLN
3.0000 mL | Freq: Two times a day (BID) | INTRAMUSCULAR | Status: DC
  Administered 2013-11-17 – 2013-11-19 (×5): 3 mL via INTRAVENOUS

## 2013-11-17 MED ORDER — HALOPERIDOL LACTATE 5 MG/ML IJ SOLN
2.0000 mg | Freq: Four times a day (QID) | INTRAMUSCULAR | Status: DC | PRN
  Filled 2013-11-17: qty 0.4

## 2013-11-17 MED ORDER — GUAIFENESIN-DM 100-10 MG/5ML PO SYRP: 15.0000 mL | ORAL_SOLUTION | ORAL | Status: DC | PRN

## 2013-11-17 MED ORDER — SODIUM CHLORIDE 0.9 % IJ SOLN: 3.0000 mL | INTRAMUSCULAR | Status: DC | PRN

## 2013-11-17 MED ORDER — NICOTINE 14 MG/24HR TD PT24
14.0000 mg | MEDICATED_PATCH | Freq: Every day | TRANSDERMAL | Status: DC
  Administered 2013-11-17 – 2013-11-20 (×4): 14 mg via TRANSDERMAL
  Filled 2013-11-17 (×4): qty 1

## 2013-11-17 MED ORDER — MOVING RIGHT ALONG BOOK
Freq: Once | Status: DC
  Filled 2013-11-17: qty 1

## 2013-11-17 MED ORDER — ACETAMINOPHEN 500 MG PO TABS: 1000.0000 mg | ORAL_TABLET | Freq: Four times a day (QID) | ORAL | Status: DC

## 2013-11-17 MED ORDER — ACETAMINOPHEN 160 MG/5ML PO SOLN
1000.0000 mg | Freq: Four times a day (QID) | ORAL | Status: AC
  Filled 2013-11-17: qty 40.6

## 2013-11-17 MED ORDER — ACETAMINOPHEN 160 MG/5ML PO SOLN
650.0000 mg | Freq: Four times a day (QID) | ORAL | Status: DC
  Filled 2013-11-17: qty 20.3

## 2013-11-17 MED ORDER — ACETAMINOPHEN 500 MG PO TABS
1000.0000 mg | ORAL_TABLET | Freq: Four times a day (QID) | ORAL | Status: AC
  Administered 2013-11-17 – 2013-11-19 (×8): 1000 mg via ORAL
  Filled 2013-11-17 (×6): qty 2

## 2013-11-17 MED ORDER — SODIUM CHLORIDE 0.9 % IV SOLN: 250.0000 mL | INTRAVENOUS | Status: DC | PRN

## 2013-11-17 NOTE — Progress Notes (Signed)
Patient and all personal belongings transferred to 2W25, patient ambulated to the room, was placed in chair and then he stated he wanted to lay down, patient placed on 2W25, helped patient in bed, RN/NT present, bed alarm was turned on.  Patient stable, denies any discomfort, tolerated transfer well.

## 2013-11-17 NOTE — Progress Notes (Signed)
Pt restless and fidgety, continues to remove blood pressure cuff and pulse ox moniter after being instructed not to.  Pt also pulling himself up in the bed and attempting to get out of bed without assistance. Pt instructed on the importance of not using his arms to pull with so to not hurt his sternum and also to not get up without assistance. Pt states he understands and that he is always this restless. Will continue to moniter pt closely, reeducate patient safety and utilize bed alarm.

## 2013-11-17 NOTE — Progress Notes (Addendum)
3 Days Post-Op Procedure(s) (LRB): INTRAOPERATIVE TRANSESOPHAGEAL ECHOCARDIOGRAM (N/A) Value sparing Root replacement, ASCENDING AORTIC ROOT REPLACEMENT, circ Arrest (N/A) Subjective: anxious  Objective: Vital signs in last 24 hours: Temp:  [97.5 F (36.4 Grimes)-98.7 F (37.1 Grimes)] 98.7 F (37.1 Grimes) (09/20 0714) Pulse Rate:  [40-123] 92 (09/20 0600) Cardiac Rhythm:  [-] Normal sinus rhythm (09/20 0600) Resp:  [19-37] 26 (09/20 0700) BP: (89-111)/(53-90) 92/59 mmHg (09/20 0700) SpO2:  [88 %-100 %] 93 % (09/20 0600) Arterial Line BP: (100-108)/(45-61) 107/48 mmHg (09/19 1500) Weight:  [204 lb 5.9 oz (92.7 kg)] 204 lb 5.9 oz (92.7 kg) (09/20 0200)  Hemodynamic parameters for last 24 hours:    Intake/Output from previous day: 09/19 0701 - 09/20 0700 In: 540 [P.O.:360; I.V.:180] Out: 2076 [Urine:2075; Stool:1] Intake/Output this shift:    General appearance: alert and no distress Neurologic: intact Heart: regular rate and rhythm Lungs: diminished breath sounds bibasilar Abdomen: normal findings: soft, non-tender  Lab Results:  Recent Labs  11/16/13 0408 11/17/13 0355  WBC 19.4* 13.4*  HGB 10.7* 10.1*  HCT 31.5* 29.8*  PLT 138* 137*   BMET:  Recent Labs  11/16/13 0408 11/17/13 0355  NA 135* 141  K 4.2 3.9  CL 101 107  CO2 25 25  GLUCOSE 106* 89  BUN 12 15  CREATININE 0.75 0.80  CALCIUM 8.1* 8.3*    PT/INR: No results found for this basename: LABPROT, INR,  in the last 72 hours ABG    Component Value Date/Time   PHART 7.281* 11/14/2013 2048   HCO3 23.1 11/14/2013 2048   TCO2 25 11/15/2013 1804   ACIDBASEDEF 4.0* 11/14/2013 2048   O2SAT 94.0 11/14/2013 2048   CBG (last 3)   Recent Labs  11/16/13 1633 11/16/13 1928 11/16/13 2202  GLUCAP 124* 107* 111*    Assessment/Plan: S/P Procedure(s) (LRB): INTRAOPERATIVE TRANSESOPHAGEAL ECHOCARDIOGRAM (N/A) Value sparing Root replacement, ASCENDING AORTIC ROOT REPLACEMENT, circ Arrest (N/A) - CV- s/p valve sparing  root replacement  BP remains low, but tolerating, no beta blocker or ACE-I until BP higher  RESP- IS  RENAL- lytes and creatinine OK.  ENDO- CBG OK  Anxiety better with klonopin, will give nicotine patch per patient request  Transfer to 2000   LOS: 3 days    Ian Grimes 11/17/2013  Resume Xarelto prior to dc, continue enoxaparin for now

## 2013-11-18 ENCOUNTER — Encounter (HOSPITAL_COMMUNITY): Payer: Self-pay | Admitting: Surgery

## 2013-11-18 ENCOUNTER — Inpatient Hospital Stay (HOSPITAL_COMMUNITY): Payer: Medicare Other

## 2013-11-18 LAB — BASIC METABOLIC PANEL
ANION GAP: 12 (ref 5–15)
BUN: 14 mg/dL (ref 6–23)
CALCIUM: 8.1 mg/dL — AB (ref 8.4–10.5)
CO2: 20 mEq/L (ref 19–32)
Chloride: 110 mEq/L (ref 96–112)
Creatinine, Ser: 0.65 mg/dL (ref 0.50–1.35)
GFR calc Af Amer: >90 mL/min (ref >90)
Glucose, Bld: 85 mg/dL (ref 70–99)
Potassium: 4 mEq/L (ref 3.7–5.3)
SODIUM: 142 meq/L (ref 137–147)

## 2013-11-18 LAB — CBC
HCT: 30.3 % — ABNORMAL LOW (ref 39.0–52.0)
Hemoglobin: 10.4 g/dL — ABNORMAL LOW (ref 13.0–17.0)
MCH: 33.4 pg (ref 26.0–34.0)
MCHC: 34.3 g/dL (ref 30.0–36.0)
MCV: 97.4 fL (ref 78.0–100.0)
Platelets: 196 10*3/uL (ref 150–400)
RBC: 3.11 MIL/uL — ABNORMAL LOW (ref 4.22–5.81)
RDW: 13.3 % (ref 11.5–15.5)
WBC: 10.9 10*3/uL — ABNORMAL HIGH (ref 4.0–10.5)

## 2013-11-18 MED FILL — Magnesium Sulfate Inj 50%: INTRAMUSCULAR | Qty: 10 | Status: AC

## 2013-11-18 MED FILL — Potassium Chloride Inj 2 mEq/ML: INTRAVENOUS | Qty: 40 | Status: AC

## 2013-11-18 MED FILL — Heparin Sodium (Porcine) Inj 1000 Unit/ML: INTRAMUSCULAR | Qty: 30 | Status: AC

## 2013-11-18 NOTE — Progress Notes (Signed)
Pt refused to ambulate this evening.  Pt education reinforced.  Will continue to monitor and encourage.  Pt resting in bed with call bell in reach.

## 2013-11-18 NOTE — Progress Notes (Signed)
11/18/2013 1330 Nursing note Pt. Found to be smoking E-cigarette in his room. Pt. Made aware of hospital policy of not using tobacco products on campus. Pt. Very cooperative and agreeable to remove E-cigarette from room. Item removed from room and placed in secure bag on patient's chart per pt request.  Burma Ketcher, Arville Lime

## 2013-11-18 NOTE — Progress Notes (Addendum)
[address]       [place] [address]             [phone]      4 Days Post-Op Procedure(s) (LRB): INTRAOPERATIVE TRANSESOPHAGEAL ECHOCARDIOGRAM (N/A) Value sparing Root replacement, ASCENDING AORTIC ROOT REPLACEMENT, circ Arrest (N/A) Subjective: Feels ok, tired  Objective: Vital signs in last 24 hours: Temp:  [98.6 F (37 C)-99.1 F (37.3 C)] 98.6 F (37 C) (09/21 0350) Pulse Rate:  [83-95] 83 (09/21 0350) Cardiac Rhythm:  [-] Normal sinus rhythm (09/20 2030) Resp:  [20-29] 20 (09/21 0350) BP: (87-114)/(56-70) 114/70 mmHg (09/21 0350) SpO2:  [94 %-98 %] 96 % (09/21 0350) Weight:  [199 lb 9.6 oz (90.538 kg)] 199 lb 9.6 oz (90.538 kg) (09/21 0350)  Hemodynamic parameters for last 24 hours:    Intake/Output from previous day: 09/20 0701 - 09/21 0700 In: 120 [P.O.:120] Out: 450 [Urine:450] Intake/Output this shift:    General appearance: alert, cooperative and no distress Heart: regular rate and rhythm Lungs: clear to auscultation bilaterally Abdomen: benign Extremities: no edema Wound: incis healing well  Lab Results:  Recent Labs  11/16/13 0408 11/17/13 0355  WBC 19.4* 13.4*  HGB 10.7* 10.1*  HCT 31.5* 29.8*  PLT 138* 137*   BMET:  Recent Labs  11/16/13 0408 11/17/13 0355  NA 135* 141  K 4.2 3.9  CL 101 107  CO2 25 25  GLUCOSE 106* 89  BUN 12 15  CREATININE 0.75 0.80  CALCIUM 8.1* 8.3*    PT/INR: No results found for this basename: LABPROT, INR,  in the last 72 hours ABG    Component Value Date/Time   PHART 7.281* 11/14/2013 2048   HCO3 23.1 11/14/2013 2048   TCO2 25 11/15/2013 1804   ACIDBASEDEF 4.0* 11/14/2013 2048   O2SAT 94.0 11/14/2013 2048   CBG (last 3)   Recent Labs  11/16/13 1928 11/16/13 2202 11/17/13 0712  GLUCAP 107* 111* 115*    Meds Scheduled Meds: . acetaminophen (TYLENOL) oral liquid 160 mg/5 mL  1,000 mg Per Tube 4 times per day   Or  . acetaminophen  1,000 mg Oral 4 times per day  .  aspirin EC  325 mg Oral Daily   Or  . aspirin  324 mg Per Tube Daily  . bisacodyl  10 mg Oral Daily   Or  . bisacodyl  10 mg Rectal Daily  . clonazePAM  1 mg Oral TID  . docusate sodium  200 mg Oral Daily  . enoxaparin (LOVENOX) injection  40 mg Subcutaneous QHS  . ezetimibe  10 mg Oral Daily  . moving right along book   Does not apply Once  . nicotine  14 mg Transdermal Daily  . pantoprazole  40 mg Oral Daily  . sertraline  100 mg Oral BID  . simvastatin  20 mg Oral q1800  . sodium chloride  3 mL Intravenous Q12H   Continuous Infusions:  PRN Meds:.sodium chloride, guaiFENesin-dextromethorphan, haloperidol lactate, ondansetron (ZOFRAN) IV, oxyCODONE, pseudoephedrine, sodium chloride  Xrays Dg Chest 2 View  11/18/2013   CLINICAL DATA:  Followup atelectasis  EXAM: CHEST  2 VIEW  COMPARISON:  11/17/2013  FINDINGS: Cardiac shadow is stable. Postsurgical changes are again seen. Bibasilar atelectatic changes are noted slightly increased on the right when compared with the prior exam. The left base is stable. Small pleural effusions are seen.  IMPRESSION: Increasing right basilar atelectasis.  Small pleural effusions are noted.   Electronically Signed  By: Inez Catalina M.D.   On: 11/18/2013 07:47   Dg Chest Port 1 View  11/17/2013   CLINICAL DATA:  Postop valve sparing aortic root replacement. Followup basilar atelectasis.  EXAM: PORTABLE CHEST - 1 VIEW  COMPARISON:  Portable chest x-rays yesterday and dating back to 11/14/2013. Two-view chest x-ray 11/12/2013.  FINDINGS: Sternotomy. Right jugular introducer sheath tip projects over the right innominate vein, unchanged. Improved aeration in the left lung base, with mild atelectasis persisting. Stable mild right basilar atelectasis. No new pulmonary parenchymal abnormalities.  IMPRESSION: Stable mild bibasilar atelectasis pressed that improved aeration in the left lung base with mild atelectasis persisting. Stable mild right basilar atelectasis. No  new abnormalities.   Electronically Signed   By: Evangeline Dakin M.D.   On: 11/17/2013 07:35    Assessment/Plan: S/P Procedure(s) (LRB): INTRAOPERATIVE TRANSESOPHAGEAL ECHOCARDIOGRAM (N/A) Value sparing Root replacement, ASCENDING AORTIC ROOT REPLACEMENT, circ Arrest (N/A)  1 doing well 2 hemodyn stable in sinus 3 no new labs 4 push rehab/pulm toilet        LOS: 4 days    GOLD,WAYNE E 11/18/2013  cxr clear, labs ok Anxiety controlled with klonopin Keep in PCTU  patient examined and medical record reviewed,agree with above note. VAN TRIGT III,Kylei Purington 11/18/2013

## 2013-11-18 NOTE — Progress Notes (Signed)
CARDIAC REHAB PHASE I   PRE:  Rate/Rhythm: 90 SR  BP:  Supine: 114/68  Sitting:   Standing:    SaO2: 95%RA  MODE:  Ambulation: 550 ft   POST:  Rate/Rhythm: 112 ST  BP:  Supine: 112/68  Sitting:   Standing:    SaO2: 96%RA 1130-1150 Pt walked 550 ft on RA with steady gait. Hand held asst. Tolerated well. Did not want to sit in recliner. Likes the bed.    Graylon Good, RN BSN  11/18/2013 11:49 AM

## 2013-11-19 DIAGNOSIS — Z95828 Presence of other vascular implants and grafts: Secondary | ICD-10-CM

## 2013-11-19 MED ORDER — TRAZODONE HCL 100 MG PO TABS
100.0000 mg | ORAL_TABLET | Freq: Every day | ORAL | Status: DC
  Administered 2013-11-19: 100 mg via ORAL
  Filled 2013-11-19 (×2): qty 1

## 2013-11-19 MED ORDER — METOPROLOL TARTRATE 12.5 MG HALF TABLET
12.5000 mg | ORAL_TABLET | Freq: Two times a day (BID) | ORAL | Status: DC
  Administered 2013-11-19 – 2013-11-20 (×3): 12.5 mg via ORAL
  Filled 2013-11-19 (×4): qty 1

## 2013-11-19 NOTE — Progress Notes (Signed)
[address]       [place] [address]             [phone]      5 Days Post-Op Procedure(s) (LRB): INTRAOPERATIVE TRANSESOPHAGEAL ECHOCARDIOGRAM (N/A) Value sparing Root replacement, ASCENDING AORTIC ROOT REPLACEMENT, circ Arrest (N/A) Subjective: Some trouble with hallucinations and paranoia. From surgical aspect conts to progress nicely  Objective: Vital signs in last 24 hours: Temp:  [97.6 F (36.4 C)-99 F (37.2 C)] 98.6 F (37 C) (09/22 0547) Pulse Rate:  [82-84] 84 (09/22 0547) Cardiac Rhythm:  [-] Normal sinus rhythm (09/21 1951) Resp:  [19-20] 19 (09/22 0547) BP: (101-126)/(53-77) 124/77 mmHg (09/22 0547) SpO2:  [96 %] 96 % (09/22 0547) Weight:  [198 lb 3.1 oz (89.9 kg)] 198 lb 3.1 oz (89.9 kg) (09/22 0419)  Hemodynamic parameters for last 24 hours:    Intake/Output from previous day: 09/21 0701 - 09/22 0700 In: 720 [P.O.:720] Out: -  Intake/Output this shift:    General appearance: alert, cooperative and no distress Heart: regular rate and rhythm and no murmur Lungs: clear to auscultation bilaterally Abdomen: benign Extremities: no edema Wound: incis healing well  Lab Results:  Recent Labs  11/17/13 0355 11/18/13 0715  WBC 13.4* 10.9*  HGB 10.1* 10.4*  HCT 29.8* 30.3*  PLT 137* 196   BMET:  Recent Labs  11/17/13 0355 11/18/13 0715  NA 141 142  K 3.9 4.0  CL 107 110  CO2 25 20  GLUCOSE 89 85  BUN 15 14  CREATININE 0.80 0.65  CALCIUM 8.3* 8.1*    PT/INR: No results found for this basename: LABPROT, INR,  in the last 72 hours ABG    Component Value Date/Time   PHART 7.281* 11/14/2013 2048   HCO3 23.1 11/14/2013 2048   TCO2 25 11/15/2013 1804   ACIDBASEDEF 4.0* 11/14/2013 2048   O2SAT 94.0 11/14/2013 2048   CBG (last 3)   Recent Labs  11/16/13 1928 11/16/13 2202 11/17/13 0712  GLUCAP 107* 111* 115*    Meds Scheduled Meds: . acetaminophen (TYLENOL) oral liquid 160 mg/5 mL  1,000 mg Per Tube 4 times per  day   Or  . acetaminophen  1,000 mg Oral 4 times per day  . aspirin EC  325 mg Oral Daily   Or  . aspirin  324 mg Per Tube Daily  . bisacodyl  10 mg Oral Daily   Or  . bisacodyl  10 mg Rectal Daily  . clonazePAM  1 mg Oral TID  . docusate sodium  200 mg Oral Daily  . enoxaparin (LOVENOX) injection  40 mg Subcutaneous QHS  . ezetimibe  10 mg Oral Daily  . moving right along book   Does not apply Once  . nicotine  14 mg Transdermal Daily  . pantoprazole  40 mg Oral Daily  . sertraline  100 mg Oral BID  . simvastatin  20 mg Oral q1800  . sodium chloride  3 mL Intravenous Q12H   Continuous Infusions:  PRN Meds:.sodium chloride, guaiFENesin-dextromethorphan, haloperidol lactate, ondansetron (ZOFRAN) IV, oxyCODONE, pseudoephedrine, sodium chloride  Xrays Dg Chest 2 View  11/18/2013   CLINICAL DATA:  Followup atelectasis  EXAM: CHEST  2 VIEW  COMPARISON:  11/17/2013  FINDINGS: Cardiac shadow is stable. Postsurgical changes are again seen. Bibasilar atelectatic changes are noted slightly increased on the right when compared with the prior exam. The left base is stable. Small pleural effusions are seen.  IMPRESSION: Increasing right  basilar atelectasis.  Small pleural effusions are noted.   Electronically Signed   By: Inez Catalina M.D.   On: 11/18/2013 07:47    Assessment/Plan: S/P Procedure(s) (LRB): INTRAOPERATIVE TRANSESOPHAGEAL ECHOCARDIOGRAM (N/A) Value sparing Root replacement, ASCENDING AORTIC ROOT REPLACEMENT, circ Arrest (N/A)   1 doing well from surgical viewpoint. Will add beta blocker as tachy at times. Will restart desyrel home dose at night. May need to consider psych consult . Poss ready for d/c in am from surgical viewpoint   LOS: 5 days    Sequan Auxier E 11/19/2013

## 2013-11-19 NOTE — Discharge Summary (Signed)
Physician Discharge Summary  Patient ID: Ian Grimes MRN: [id number] DOB/AGE: 1970-10-09 43 y.o.  Admit date: 11/14/2013 Discharge date: 11/19/2013  Admission Diagnoses:  Patient Active Problem List   Diagnosis Date Noted  . Congenital bicuspid aortic valve 11/07/2013  . Aneurysm, ascending aorta 08/19/2013  . Solitary pulmonary nodule 06/16/2013  . Pulmonary emboli 05/06/2013  . Tobacco use 05/06/2013  . Acute bronchitis 05/06/2013  . Hyperlipidemia LDL goal < 100 01/18/2013  . Depression 07/17/2012  . Schizoaffective disorder 07/17/2012  . Hyperlipidemia 07/17/2012   Discharge Diagnoses:   Patient Active Problem List   Diagnosis Date Noted  . S/P ascending aortic replacement 11/19/2013  . Congenital bicuspid aortic valve 11/07/2013  . Aneurysm, ascending aorta 08/19/2013  . Solitary pulmonary nodule 06/16/2013  . Pulmonary emboli 05/06/2013  . Tobacco use 05/06/2013  . Acute bronchitis 05/06/2013  . Hyperlipidemia LDL goal < 100 01/18/2013  . Depression 07/17/2012  . Schizoaffective disorder 07/17/2012  . Hyperlipidemia 07/17/2012   Discharged Condition: good  History of Present Illness:   Mr. Patient is a 43 yo male with known history of Schizo-affective disorder and nicotine abuse.  The patient presented in March of this year with a several week complaint of shortness of breath.  Workup consisted of CXR which revealed a small pulmonary nodule.  He was treated with Prednisone and Amoxicillin which did not improve his symptoms.  Further workup with CT scan was performed and showed bilateral pulmonary emboli and an 8 mm x 6 mm triangular subpleural nodule in the right middle lobe.  Due to the presence of pulmonary emboli the patient was admitted and started on Heparin and Xarelto.  Patient underwent repeat CT scan of the chest in June of this year which showed resolution of the previous pulmonary nodule.  However, he was noted to have a fusiform dilatation of the ascending  aorta to 4.9 cm. It was felt surgical intervention may be required and he was subsequently referred to [facility] for evaluation.  He was evaluated by Dr. Cyndia Bent on 09/06/2013 at which time after review of his CT scan felt the aneurysm was present on original CT scan from March but was not commented on.  It was also felt that the aneurysm had grown at least 2 mm since that previous scan.  It was recommended patient undergo Echocardiogram to assess valve function as well as evaluation by Cardiology to evaluate the possibility of coronary artery disease.  He was evaluated by Dr. Johnsie Cancel who performed cardiac catheterization which was free from coronary disease.  Echocardiogram did show evidence of a bicuspid aortic valve however there was no stenosis or regurgitation present.  He was again evaluated by Dr. Cyndia Bent on 11/07/2013 at which time it was decided to proceed with valve sparing Ascending Aortic Aneurysm Replacement.  The risks and benefits of the procedure were explained to the patient and he was agreeable to proceed.  Hospital Course:   Mr. Weathers presented to [facility] on 11/14/2013.  He was taken to the operating room and underwent Valve Sparing Aortic Root Replacement with a 30 mm Gelweave Valsalva graft.  He tolerated the procedure well and was taken to the SICU ins table condition.  The patient was extubated the evening of surgery.  During his stay in the SICU the patient was weaned off Neo Synephrine and Dopamine drips as tolerated.  He required temporary AAI pacing which was discontinued as patient tolerated.  His chest tubes and arterial lines were removed without difficulty.  The patient developed anxiety which is not uncommon for him.  He was started on Klonopin which provided relief in his symptoms.  He was maintaining NSR and transferred to the step down unit in stable condition.    The patient has continued to progress.  He remains hemodynamically stable and is tolerating a Beta Blocker  without difficulty.  His pacing wires have been removed without difficulty.  The patient did develop hallucinations.  He was reassured by nursing staff, however this is not unexpected given his Psych history.  He will be restarted on his home Xarelto for previous Pulmonary Embolism.  He is ambulating without difficulty. He is tolerating a heart healthy diet.  Should no further issues arise we anticipate discharge home in the next 24-48 hours.  Significant Diagnostic Studies:   CT Scan: 1. No suspicious pulmonary nodules. Subpleural density previously  noted at the right lung base has resolved, likely atelectasis or  sequela of pulmonary embolism demonstrated on prior examination.  2. Mild centrilobular and paraseptal emphysema.  3. Ascending aortic aneurysm appears mildly enlarged compared with  the prior study. Of note, previously demonstrated pulmonary embolism  is not addressed on this non contrast study.  ECHO:  - Left ventricle: The cavity size was normal. Wall thickness was normal. Systolic function was normal. The estimated ejection fraction was in the range of 55% to 60%. Wall motion was normal; there were no regional wall motion abnormalities. Left ventricular diastolic function parameters were normal. - Aortic valve: Bicuspid. - Ascending aorta: The ascending aorta was moderately dilated. - Atrial septum: No defect or patent foramen ovale was identified  Cardiac Catheterization:  Hemodynamics:  Aortic Pressure: 105 68 mmHg LV Pressure :85 54 MmHg  Right Heart:  Mean RA 4 mmHg  RV: 25/2 mmHg  PA: 21/8 mean 13 mmHg  PCWP: 11 mmHg  CO: 2.61 Liters/min Seems low  Impression: Normal coronary arteries Bicuspid AV with mild AS. Severe aortic root dilatation. Normal right heart pressures    Treatments: surgery:   1. Median Sternotomy 2. Extracorporeal circulation 3. Valve-sparing aortic root replacement with reimplantation of the native aortic valve and left and right  coronary arteries ( 30 mm Gelweave valsalva graft).  4. Replacement of ascending aortic aneurysm with a 30 mm Hemashield graft using deep hypothermic circulatory arrest.  Disposition: 01-Home or Self Care  The patient has been discharged on:   Medication List         aspirin 325 MG EC tablet  Take 1 tablet (325 mg total) by mouth daily.     clonazePAM 1 MG tablet  Commonly known as:  KLONOPIN  Take 1 mg by mouth 3 (three) times daily.     ezetimibe 10 MG tablet  Commonly known as:  ZETIA  Take 10 mg by mouth daily.     fenofibrate micronized 130 MG capsule  Commonly known as:  ANTARA  Take 1 capsule (130 mg total) by mouth daily before breakfast.     metoprolol tartrate 12.5 mg Tabs tablet  Commonly known as:  LOPRESSOR  Take 0.5 tablets (12.5 mg total) by mouth 2 (two) times daily.     nicotine 14 mg/24hr patch  Commonly known as:  NICODERM CQ - dosed in mg/24 hours  Place 1 patch (14 mg total) onto the skin daily.     oxyCODONE 5 MG immediate release tablet  Commonly known as:  Oxy IR/ROXICODONE  Take 1-2 tablets (5-10 mg total) by mouth every 4 (four) hours as needed  for moderate pain.     pravastatin 40 MG tablet  Commonly known as:  PRAVACHOL  Take 1 tablet (40 mg total) by mouth daily.     rivaroxaban 20 MG Tabs tablet  Commonly known as:  XARELTO  Take 1 tablet (20 mg total) by mouth daily with supper.     sertraline 100 MG tablet  Commonly known as:  ZOLOFT  Take 1 tablet (100 mg total) by mouth 2 (two) times daily.     SUDAFED PO  Take 2 tablets by mouth 2 (two) times daily as needed (sinus headache).     traZODone 100 MG tablet  Commonly known as:  DESYREL  Take 100 mg by mouth at bedtime.     VISINE OP  Place 2 drops into both eyes daily as needed (dry eyes).        1.Beta Blocker:  Yes [ x  ]                              No   [   ]                              If No, reason:  2.Ace Inhibitor/ARB: Yes [   ]                                      No  [  x  ]                                     If No, reason: Labile Blood Pressure  3.Statin:   Yes [x   ]                  No  [   ]                  If No, reason:  4.Ecasa:  Yes  [ x  ]                  No   [   ]                  If No, reason:  Follow-up Information   Follow up with Gaye Pollack, MD On 12/18/2013. (Appointment is at 10:30)    Specialty:  Cardiothoracic Surgery   Contact information:   [address] [place] [address] [phone]       Follow up with [facility] On 12/18/2013. (Please get CXR at 9:30, office is located on first floor of [facility])    Contact information:   [place]       Follow up with [facility] On 12/02/2013. (Appointment is at 10:00, For suture removal)       Follow up with [name], MD. (call to arrange appointment for 2 weeks with your cardiologist)    Specialty:  Cardiology   Contact information:   [address] [place] [address] [phone]      Signed: Ellwood Handler 11/19/2013, 11:58 AM

## 2013-11-19 NOTE — Progress Notes (Signed)
CARDIAC REHAB PHASE I   PRE:  Rate/Rhythm: 68 SR  BP:  Supine: 107/68  Sitting:   Standing:    SaO2: 95%RA  MODE:  Ambulation: 1100 ft   POST:  Rate/Rhythm: 85 SR  BP:  Supine:   Sitting: 100/65  Standing:    SaO2: 97%RA 1318-1340 Pt walked 1100 ft with hand held asst. Tolerated well. Stated could have walked farther. To chair after walk.   Graylon Good, RN BSN  11/19/2013 1:38 PM

## 2013-11-19 NOTE — Discharge Instructions (Signed)
Aortic Replacement, Care After Refer to this sheet in the next few weeks. These instructions provide you with information on caring for yourself after your procedure. Your health care provider may also give you specific instructions. Your treatment has been planned according to current medical practices, but problems sometimes occur. Call your health care provider if you have any problems or questions after your procedure. HOME CARE INSTRUCTIONS   Take medicines only as directed by your health care provider.  If your health care provider has prescribed elastic stockings, wear them as directed.  Take frequent naps or rest often throughout the day.  Avoid lifting over 10 lbs (4.5 kg) or pushing or pulling things with your arms for 6-8 weeks or as directed by your health care provider.  Avoid driving or airplane travel for 4-6 weeks after surgery or as directed by your health care provider. If you are riding in a car for an extended period, stop every 1-2 hours to stretch your legs. Keep a record of your medicines and medical history with you when traveling.  Do not drive or operate heavy machinery while taking pain medicine. (narcotics).  Do not cross your legs.  Do not use any tobacco products including cigarettes, chewing tobacco, or electronic cigarettes. If you need help quitting, ask your health care provider.  Do not take baths, swim, or use a hot tub until your health care provider approves. Take showers once your health care provider approves. Pat incisions dry. Do not rub incisions with a washcloth or towel.  Avoid climbing stairs and using the handrail to pull yourself up for the first 2-3 weeks after surgery.  Return to work as directed by your health care provider.  Drink enough fluid to keep your urine clear or pale yellow.  Do not strain to have a bowel movement. Eat high-fiber foods if you become constipated. You may also take a medicine to help you have a bowel movement  (laxative) as directed by your health care provider.  Resume sexual activity as directed by your health care provider. Men should not use medicines for erectile dysfunction until their doctor says it isokay.  If you had a certain type of heart condition in the past, you may need to take antibiotic medicine before having dental work or surgery. Let your dentist and health care providers know if you had one or more of the following:  Previous endocarditis.  An artificial (prosthetic) heart valve.  Congenital heart disease. SEEK MEDICAL CARE IF:  You develop a skin rash.   You experience sudden changes in your weight.  You have a fever. SEEK IMMEDIATE MEDICAL CARE IF:   You develop chest pain that is not coming from your incision.  You have drainage (pus), redness, swelling, or pain at your incision site.   You develop shortness of breath or have difficulty breathing.   You have increased bleeding from your incision site.   You develop light-headedness.  MAKE SURE YOU:   Understand these directions.  Will watch your condition.  Will get help right away if you are not doing well or get worse. Document Released: 09/02/2004 Document Revised: 07/01/2013 Document Reviewed: 11/29/2011 [facility] Patient Information 2015 [place], [place]. This information is not intended to replace advice given to you by your health care provider. Make sure you discuss any questions you have with your health care provider.

## 2013-11-19 NOTE — Care Management Note (Unsigned)
    Page 1 of 1   11/19/2013     3:27:59 PM CARE MANAGEMENT NOTE 11/19/2013  Patient:  Ian Grimes, Ian Grimes   Account Number:  [id number]  Date Initiated:  11/18/2013  Documentation initiated by:  Retaj Hilbun  Subjective/Objective Assessment:   Pt s/p aortic root replacement and AAA repair.  PTA, pt lives at home alone and is independent.     Action/Plan:   Pt states he has neighbors, friends, and church members who will be checking on him frequently, but won't have 24h care at dc.   Anticipated DC Date:  11/20/2013   Anticipated DC Plan:  [place]  CM consult      Choice offered to / List presented to:             Status of service:  In process, will continue to follow Medicare Important Message given?  YES (If response is "NO", the following Medicare IM given date fields will be blank) Date Medicare IM given:  11/18/2013 Medicare IM given by:  Duwayne Matters Date Additional Medicare IM given:   Additional Medicare IM given by:    Discharge Disposition:    Per UR Regulation:  Reviewed for med. necessity/level of care/duration of stay  If discussed at [place] of Stay Meetings, dates discussed:    Comments:  11/18/13 Ellan Lambert, RN, BSN [phone] Pt states he has discussed dc plan with MD, and he is okay with it.  Pt ambulating independently without assistive device.  Will follow for dc needs.

## 2013-11-19 NOTE — Progress Notes (Addendum)
Pt came to nursing station and started explaining/talking about hallucinations he is having.  Pt states "there are college students outside, I need security or the police as soon as possible", "I think I am in legal trouble", "this is my living room and out there is the hospital, but it drops off on the other side", "I heard a group of people talking about a large sum of money". Pt states he heard this and could not make this up. RN back to room with patient.  Pt states he just needs someone to talk to, RN talks to patient and pt relaxes and settles down. Pt denies any SI or thoughts of harming himself or others. Pt states he is hurting, PRN pain medication administered.  Pt states he also wants to talk to someone to know why this is happening to him.  Will continue to monitor pt closely.  Pt resting with call bell in reach.     0277 Pt came out of room and states he wants to see Dr. Cyndia Bent because he heard him talking about him in the hall.  Pt escorted back to room and explained the doctors are not here yet and would be around to see him this morning.  Pt cooperative and understanding.  Pt resting in bed with call bell in reach.    Pt asked if he wanted to walk, pt states "No I better stay in here it sounds like they are talking about suicide out there and I don't want to hear that".  Pt again denies SI or thoughts of harming himself.  Will report to day shift RN and monitor very closely.  Pts door open near nurses station.

## 2013-11-20 MED ORDER — ASPIRIN 325 MG PO TBEC: 325.0000 mg | DELAYED_RELEASE_TABLET | Freq: Every day | ORAL | Status: DC

## 2013-11-20 MED ORDER — OXYCODONE HCL 5 MG PO TABS: 5.0000 mg | ORAL_TABLET | ORAL | Status: DC | PRN

## 2013-11-20 MED ORDER — METOPROLOL TARTRATE 12.5 MG HALF TABLET: 12.5000 mg | ORAL_TABLET | Freq: Two times a day (BID) | ORAL | Status: DC

## 2013-11-20 MED ORDER — NICOTINE 14 MG/24HR TD PT24: 14.0000 mg | MEDICATED_PATCH | Freq: Every day | TRANSDERMAL | Status: DC

## 2013-11-20 NOTE — Progress Notes (Addendum)
      [address]       [place] [address]             [phone]      6 Days Post-Op Procedure(s) (LRB): INTRAOPERATIVE TRANSESOPHAGEAL ECHOCARDIOGRAM (N/A) Value sparing Root replacement, ASCENDING AORTIC ROOT REPLACEMENT, circ Arrest (N/A) Subjective: Feels better, no more hallucinations  Objective: Vital signs in last 24 hours: Temp:  [97.8 F (36.6 C)-98.4 F (36.9 C)] 98.4 F (36.9 C) (09/23 0416) Pulse Rate:  [73-97] 82 (09/23 0416) Cardiac Rhythm:  [-] Normal sinus rhythm (09/22 1920) Resp:  [18-19] 19 (09/23 0416) BP: (100-102)/(59-65) 102/63 mmHg (09/23 0416) SpO2:  [96 %-97 %] 97 % (09/23 0416) Weight:  [194 lb 0.1 oz (88 kg)] 194 lb 0.1 oz (88 kg) (09/23 0201)  Hemodynamic parameters for last 24 hours:    Intake/Output from previous day: 09/22 0701 - 09/23 0700 In: 1560 [P.O.:1560] Out: -  Intake/Output this shift:    General appearance: alert, cooperative and no distress Heart: regular rate and rhythm Lungs: clear to auscultation bilaterally Abdomen: soft, non-tender; bowel sounds normal; no masses,  no organomegaly Extremities: no edema, redness or tenderness in the calves or thighs Wound: incis healing well  Lab Results:  Recent Labs  11/18/13 0715  WBC 10.9*  HGB 10.4*  HCT 30.3*  PLT 196   BMET:  Recent Labs  11/18/13 0715  NA 142  K 4.0  CL 110  CO2 20  GLUCOSE 85  BUN 14  CREATININE 0.65  CALCIUM 8.1*    PT/INR: No results found for this basename: LABPROT, INR,  in the last 72 hours ABG    Component Value Date/Time   PHART 7.281* 11/14/2013 2048   HCO3 23.1 11/14/2013 2048   TCO2 25 11/15/2013 1804   ACIDBASEDEF 4.0* 11/14/2013 2048   O2SAT 94.0 11/14/2013 2048   CBG (last 3)  No results found for this basename: GLUCAP,  in the last 72 hours  Meds Scheduled Meds: . aspirin EC  325 mg Oral Daily   Or  . aspirin  324 mg Per Tube Daily  . bisacodyl  10 mg Oral Daily   Or  . bisacodyl  10 mg Rectal  Daily  . clonazePAM  1 mg Oral TID  . docusate sodium  200 mg Oral Daily  . enoxaparin (LOVENOX) injection  40 mg Subcutaneous QHS  . ezetimibe  10 mg Oral Daily  . metoprolol tartrate  12.5 mg Oral BID  . moving right along book   Does not apply Once  . nicotine  14 mg Transdermal Daily  . pantoprazole  40 mg Oral Daily  . sertraline  100 mg Oral BID  . simvastatin  20 mg Oral q1800  . sodium chloride  3 mL Intravenous Q12H  . traZODone  100 mg Oral QHS   Continuous Infusions:  PRN Meds:.sodium chloride, guaiFENesin-dextromethorphan, haloperidol lactate, ondansetron (ZOFRAN) IV, oxyCODONE, pseudoephedrine, sodium chloride  Xrays No results found.  Assessment/Plan: S/P Procedure(s) (LRB): INTRAOPERATIVE TRANSESOPHAGEAL ECHOCARDIOGRAM (N/A) Value sparing Root replacement, ASCENDING AORTIC ROOT REPLACEMENT, circ Arrest (N/A) d/c pacing wires Plan for discharge: see discharge orders   LOS: 6 days    GOLD,WAYNE E 11/20/2013   Chart reviewed, patient examined, agree with above. He looks great and is ready to go home today. Will resume Xarelto at home which he was on for history of PE presumed to be from DVT in March 2015.

## 2013-11-20 NOTE — Progress Notes (Signed)
EPW removed per order. Ends intact. VSS. Pt instructed of one hour bedrest. Verbalized understanding. Will continue to monitor pt cloesly. 

## 2013-11-20 NOTE — Progress Notes (Signed)
[id number] Reviewed sternal precautions, exercise, smoking cessation with pt who voiced understanding. Gave tobacco cessation handouts but pt does not feel that he will be able to quit. Has tried before and only lasted couple of days. Left heart healthy diet for information. Pt voiced understanding. Pt stated that the caffeine and nicotine help him to feel better with the psych meds that can make him sleepy. Graylon Good RN BSN 11/20/2013 10:24 AM

## 2013-11-20 NOTE — Progress Notes (Signed)
Assessment unchanged. Discussed D/C instructions with pt including f/u appointments and new medications. Verbalized understanding. RX given to pt. IV and tele removed. Pt left with belongings accompanied by Volunteer. 

## 2013-11-25 ENCOUNTER — Other Ambulatory Visit: Payer: Self-pay | Admitting: *Deleted

## 2013-11-25 DIAGNOSIS — G8918 Other acute postprocedural pain: Secondary | ICD-10-CM

## 2013-11-25 MED ORDER — OXYCODONE HCL 5 MG PO TABS: 5.0000 mg | ORAL_TABLET | ORAL | Status: DC | PRN

## 2013-11-29 ENCOUNTER — Telehealth: Payer: Self-pay | Admitting: *Deleted

## 2013-11-29 DIAGNOSIS — G8918 Other acute postprocedural pain: Secondary | ICD-10-CM

## 2013-11-29 DIAGNOSIS — R443 Hallucinations, unspecified: Secondary | ICD-10-CM | POA: Diagnosis not present

## 2013-11-29 DIAGNOSIS — F688 Other specified disorders of adult personality and behavior: Secondary | ICD-10-CM | POA: Diagnosis not present

## 2013-11-29 MED ORDER — OXYCODONE HCL 5 MG PO TABS: 5.0000 mg | ORAL_TABLET | Freq: Four times a day (QID) | ORAL | Status: DC | PRN

## 2013-11-29 NOTE — Telephone Encounter (Signed)
Ian Grimes has called for a refill for his pain med.  I said he or a representative  could pick up a signed script from  our office this afternoon after 3:30 and he agreed.

## 2013-12-02 ENCOUNTER — Ambulatory Visit (INDEPENDENT_AMBULATORY_CARE_PROVIDER_SITE_OTHER): Payer: Self-pay

## 2013-12-02 DIAGNOSIS — I712 Thoracic aortic aneurysm, without rupture: Secondary | ICD-10-CM

## 2013-12-02 DIAGNOSIS — I7121 Aneurysm of the ascending aorta, without rupture: Secondary | ICD-10-CM

## 2013-12-02 DIAGNOSIS — I350 Nonrheumatic aortic (valve) stenosis: Secondary | ICD-10-CM

## 2013-12-02 NOTE — Progress Notes (Signed)
Pt was recieved for Suture Removal in office today. Pt's skin is healing well. No drainage,No fever to the touch. Pt tolerated removal well.

## 2013-12-04 ENCOUNTER — Encounter: Payer: Medicare Other | Admitting: Physician Assistant

## 2013-12-11 ENCOUNTER — Encounter: Payer: Self-pay | Admitting: *Deleted

## 2013-12-16 ENCOUNTER — Ambulatory Visit (INDEPENDENT_AMBULATORY_CARE_PROVIDER_SITE_OTHER): Payer: Medicare Other | Admitting: Physician Assistant

## 2013-12-16 ENCOUNTER — Encounter: Payer: Self-pay | Admitting: Physician Assistant

## 2013-12-16 VITALS — BP 130/90 | HR 68 | Ht 72.0 in | Wt 188.0 lb

## 2013-12-16 DIAGNOSIS — I7121 Aneurysm of the ascending aorta, without rupture: Secondary | ICD-10-CM

## 2013-12-16 DIAGNOSIS — I2699 Other pulmonary embolism without acute cor pulmonale: Secondary | ICD-10-CM

## 2013-12-16 DIAGNOSIS — E785 Hyperlipidemia, unspecified: Secondary | ICD-10-CM

## 2013-12-16 DIAGNOSIS — Q239 Congenital malformation of aortic and mitral valves, unspecified: Secondary | ICD-10-CM

## 2013-12-16 DIAGNOSIS — I712 Thoracic aortic aneurysm, without rupture: Secondary | ICD-10-CM | POA: Diagnosis not present

## 2013-12-16 DIAGNOSIS — Q231 Congenital insufficiency of aortic valve: Secondary | ICD-10-CM

## 2013-12-16 DIAGNOSIS — Z95828 Presence of other vascular implants and grafts: Secondary | ICD-10-CM | POA: Diagnosis not present

## 2013-12-16 DIAGNOSIS — Z720 Tobacco use: Secondary | ICD-10-CM

## 2013-12-16 NOTE — Patient Instructions (Signed)
Your physician has requested that you have an echocardiogram. Echocardiography is a painless test that uses sound waves to create images of your heart. It provides your doctor with information about the size and shape of your heart and how well your heart's chambers and valves are working. This procedure takes approximately one hour. There are no restrictions for this procedure.  FOLLOW UP WITH PRIMARY CARE PHYSICIAN ABOUT HEADACHES  Your physician recommends that you schedule a follow-up appointment in: [place] DR. Johnsie Cancel

## 2013-12-16 NOTE — Progress Notes (Signed)
Cardiology Office Note   Date:  12/16/2013   ID:  Ian Grimes, DOB [birth year], MRN [id number]  PCP:  Redge Gainer, MD  Cardiologist:  Dr. Jenkins Grimes    History of Present Illness: Ian Grimes is a 43 y.o. male with a hx of bilateral pulmonary emboli 04/2013 on Xarelto, bicuspid aortic valve, Schizoaffective disorder.  CT scan in March demonstrated a RML nodule.  FU CT in June showed resolution of the nodule but did demonstrate a fusiform dilatation of the ascending aorta to 4.9 cm.  He was ultimately referred for surgical intervention. He saw Dr. Cyndia Grimes and was admitted 9/17-9/22 for valve sparing aortic root replacement and replacement of the ascending aortic aneurysm with a 30 mm Hemashield graft. Postoperative course was fairly uneventful and he did remain in NSR. He returns for FU.  He is overall doing well. He denies much chest soreness.  Denies significant dyspnea.   He denies orthopnea, PND, edema.  Denies syncope.  Main complaint is a R sided HA.  This is chronic (has had it for years) and is related to "sinus problems."  He notes it has gotten worse since DC.     Studies:  - LHC (9/15):  Normal coronary arteries, bicuspid aortic valve with mild aortic stenosis, severe ascending aortic root dilatation with no significant AI  - Echo (7/15):  EF 55-60%, normal wall motion, normal diastolic function, bicuspid aortic valve, ascending aorta moderately dilated  - Carotid US (9/15):  Bilateral ICA 1-39%   Recent Labs/Images:  Recent Labs  05/06/13 1223 05/07/13 0515  11/12/13 1408  11/18/13 0715  NA 139 144  < > 138  < > 142  K 3.5* 3.8  < > 4.5  < > 4.0  BUN 7 8  < > 15  < > 14  CREATININE 0.90 0.93  < > 0.84  < > 0.65  ALT 26 24  < > 23  --   --   HGB 14.3 14.2  < > 17.0  < > 10.4*  TSH  --  1.302  --   --   --   --   LDLCALC 49  --   --   --   --   --   HDL 18*  --   --   --   --   --   < > = values in this interval not displayed.      Wt Readings from Last 3  Encounters:  12/16/13 188 lb (85.276 kg)  11/20/13 194 lb 0.1 oz (88 kg)  11/20/13 194 lb 0.1 oz (88 kg)     Past Medical History  Diagnosis Date  . Schizo-affective psychosis   . Anxiety   . Depression   . HA (headache)   . Hyperlipidemia   . Pulmonary emboli   . Pulmonary nodule     follow up CT 6 months (due 9/15)  . Pleural effusion     Current Outpatient Prescriptions  Medication Sig Dispense Refill  . ezetimibe (ZETIA) 10 MG tablet Take 10 mg by mouth daily.      . fenofibrate micronized (ANTARA) 130 MG capsule Take 1 capsule (130 mg total) by mouth daily before breakfast.  30 capsule  3  . metoprolol tartrate (LOPRESSOR) 12.5 mg TABS tablet Take 0.5 tablets (12.5 mg total) by mouth 2 (two) times daily.  60 each  1  . pravastatin (PRAVACHOL) 40 MG tablet Take 1 tablet (40 mg total) by mouth daily.  90 tablet  3  . Pseudoephedrine HCl (SUDAFED PO) Take 2 tablets by mouth 2 (two) times daily as needed (sinus headache).      . rivaroxaban (XARELTO) 20 MG TABS tablet Take 1 tablet (20 mg total) by mouth daily with supper.  30 tablet  5  . sertraline (ZOLOFT) 100 MG tablet Take 1 tablet (100 mg total) by mouth 2 (two) times daily.  30 tablet  0  . Tetrahydrozoline HCl (VISINE OP) Place 2 drops into both eyes daily as needed (dry eyes).      . traZODone (DESYREL) 100 MG tablet Take 100 mg by mouth at bedtime.        No current facility-administered medications for this visit.     Allergies:   Bactrim   Social History:  The patient  reports that he has been smoking Cigarettes.  He has a 22 pack-year smoking history. He has never used smokeless tobacco. He reports that he drinks alcohol. He reports that he does not use illicit drugs.   Family History:  The patient's family history includes Asthma in his paternal aunt; COPD in his paternal aunt; Cancer in his father; Heart attack in his father; Heart disease in his father; Stroke in his father and paternal uncle.   ROS:  Please  see the history of present illness.      All other systems reviewed and negative.    PHYSICAL EXAM: VS:  BP 130/90  Pulse 68  Ht 6' (1.829 m)  Wt 188 lb (85.276 kg)  BMI 25.49 kg/m2 Well nourished, well developed, in no acute distress HEENT: normal Neck: no JVD Cardiac:  normal S1, S2; RRR; no murmur Chest:  Median sternotomy well healed without erythema or discharge Lungs:  clear to auscultation bilaterally, no wheezing, rhonchi or rales Abd: soft, nontender, no hepatomegaly Ext: no edema Skin: warm and dry Neuro:  CNs 2-12 intact, no focal abnormalities noted  EKG:  NSR, HR 68, normal axis, no ST changes      ASSESSMENT AND PLAN:  1.  Congenital bicuspid aortic valve and Aneurysm of the ascending aorta S/P valve sparing ascending aortic replacement:  Progressing well. He is not interested in attending formal cardiac rehab.  I would recommend SBE prophylaxis unless Dr. Cyndia Grimes feels otherwise.  I will arrange a FU echo.  He is not taking ASA.  He remains on Xarelto.  He does not need to take an ASA unless he comes off of Xarelto.   2.  Pulmonary emboli:  Continue Xarelto.  3.  Hyperlipidemia:  Continue statin.  FU with primary care. 4.  Tobacco use:  We discussed the importance of quitting. 5.  Headache: I have advised him to FU with primary care for further management.   Disposition:   FU with Dr. Jenkins Grimes 2 mos.    Signed, Versie Starks, MHS 12/16/2013 2:43 PM    [facility] [address], [place], [place]  [address] Phone: [phone]; Fax: [phone]

## 2013-12-17 ENCOUNTER — Other Ambulatory Visit: Payer: Self-pay | Admitting: Surgery

## 2013-12-17 DIAGNOSIS — Q231 Congenital insufficiency of aortic valve: Secondary | ICD-10-CM

## 2013-12-18 ENCOUNTER — Ambulatory Visit (INDEPENDENT_AMBULATORY_CARE_PROVIDER_SITE_OTHER): Payer: Self-pay | Admitting: Surgery

## 2013-12-18 ENCOUNTER — Encounter: Payer: Self-pay | Admitting: Surgery

## 2013-12-18 ENCOUNTER — Other Ambulatory Visit: Payer: Self-pay | Admitting: *Deleted

## 2013-12-18 ENCOUNTER — Ambulatory Visit
Admission: RE | Admit: 2013-12-18 | Discharge: 2013-12-18 | Disposition: A | Discharge status: Home / Self Care | Payer: Medicare Other | Source: Ambulatory Visit | Attending: Surgery | Admitting: Surgery

## 2013-12-18 VITALS — BP 123/82 | HR 99 | Ht 72.0 in | Wt 189.0 lb

## 2013-12-18 DIAGNOSIS — Q231 Congenital insufficiency of aortic valve: Secondary | ICD-10-CM

## 2013-12-18 DIAGNOSIS — I712 Thoracic aortic aneurysm, without rupture: Secondary | ICD-10-CM

## 2013-12-18 DIAGNOSIS — I7121 Aneurysm of the ascending aorta, without rupture: Secondary | ICD-10-CM

## 2013-12-18 DIAGNOSIS — Z95828 Presence of other vascular implants and grafts: Secondary | ICD-10-CM

## 2013-12-18 DIAGNOSIS — J9811 Atelectasis: Secondary | ICD-10-CM | POA: Diagnosis not present

## 2013-12-18 DIAGNOSIS — J918 Pleural effusion in other conditions classified elsewhere: Secondary | ICD-10-CM | POA: Diagnosis not present

## 2013-12-18 DIAGNOSIS — G8928 Other chronic postprocedural pain: Secondary | ICD-10-CM

## 2013-12-18 MED ORDER — HYDROCODONE-ACETAMINOPHEN 7.5-325 MG PO TABS: 1.0000 | ORAL_TABLET | Freq: Four times a day (QID) | ORAL | Status: DC | PRN
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
## 2013-12-20 ENCOUNTER — Ambulatory Visit (HOSPITAL_COMMUNITY): Payer: Medicare Other | Attending: Physician Assistant | Admitting: Radiology

## 2013-12-20 DIAGNOSIS — Z95828 Presence of other vascular implants and grafts: Secondary | ICD-10-CM | POA: Diagnosis not present

## 2013-12-20 DIAGNOSIS — E785 Hyperlipidemia, unspecified: Secondary | ICD-10-CM | POA: Insufficient documentation

## 2013-12-20 DIAGNOSIS — Q239 Congenital malformation of aortic and mitral valves, unspecified: Secondary | ICD-10-CM | POA: Insufficient documentation

## 2013-12-20 DIAGNOSIS — Q231 Congenital insufficiency of aortic valve: Secondary | ICD-10-CM

## 2013-12-20 NOTE — Progress Notes (Signed)
Echocardiogram performed.  

## 2013-12-23 ENCOUNTER — Telehealth: Payer: Self-pay | Admitting: *Deleted

## 2013-12-23 ENCOUNTER — Encounter: Payer: Self-pay | Admitting: Physician Assistant

## 2013-12-23 NOTE — Telephone Encounter (Signed)
pt notified about echo results with verbal understanding. Pt advised to keep f/u 12/16 with Dr. Johnsie Cancel, pt said ok and thank you.

## 2013-12-31 ENCOUNTER — Institutional Professional Consult (permissible substitution): Payer: Medicare Other | Admitting: Pulmonary Disease

## 2014-01-01 ENCOUNTER — Other Ambulatory Visit: Payer: Self-pay

## 2014-01-01 DIAGNOSIS — G8928 Other chronic postprocedural pain: Secondary | ICD-10-CM

## 2014-01-01 MED ORDER — HYDROCODONE-ACETAMINOPHEN 7.5-325 MG PO TABS: 1.0000 | ORAL_TABLET | Freq: Four times a day (QID) | ORAL | Status: DC | PRN

## 2014-01-01 NOTE — Telephone Encounter (Signed)
RX for [employer] out and pt will pick up at front desk

## 2014-01-20 ENCOUNTER — Telehealth: Payer: Self-pay | Admitting: Cardiovascular Disease

## 2014-01-20 ENCOUNTER — Telehealth: Payer: Self-pay | Admitting: Family Medicine

## 2014-01-20 ENCOUNTER — Telehealth: Payer: Self-pay | Admitting: *Deleted

## 2014-01-20 DIAGNOSIS — J019 Acute sinusitis, unspecified: Secondary | ICD-10-CM | POA: Diagnosis not present

## 2014-01-20 NOTE — Telephone Encounter (Signed)
New Message        Pt calling stating he is having some severe side effects from his medication. Please call pt and advise.

## 2014-01-20 NOTE — Telephone Encounter (Signed)
Gave patient to triage

## 2014-01-20 NOTE — Telephone Encounter (Signed)
Patient called stating that he was having chest pain. Patient advised that he needs to go to to the ER for evaluation and contact his cardiologist. Patient stated he just had open heart surgery in September 2015

## 2014-01-20 NOTE — Telephone Encounter (Signed)
SPOKE  WITH  PT RE MESSAGE  PT COMPLAINING WITH    CHEST  PAIN  ,CONSTANT H/A, NAUSEA  X  2 MO ,BLURRED  VISION  AND  DIZZINESS  FOR APPROX   MONTH  THINKS  MAY BE  COMING FROM  METOPROLOL   WILL FORWARD TO  DR Johnsie Cancel FOR  REVIEW

## 2014-01-20 NOTE — Telephone Encounter (Signed)
Ok to start taking metoprolol once/day for a week then stop F/u with primary for other symptoms if don't improve off beta blocker   F/U me next available

## 2014-01-21 NOTE — Telephone Encounter (Signed)
Follow up ° ° ° ° ° °Returning Christine's call °

## 2014-01-21 NOTE — Telephone Encounter (Signed)
PT  NOTIFIED  WILL KEEP   APPT   ON 02-12-14 .Adonis Housekeeper

## 2014-01-21 NOTE — Telephone Encounter (Signed)
lmtcb ./cy 

## 2014-02-03 ENCOUNTER — Encounter: Payer: Self-pay | Admitting: Pulmonary Disease

## 2014-02-03 ENCOUNTER — Ambulatory Visit (INDEPENDENT_AMBULATORY_CARE_PROVIDER_SITE_OTHER): Payer: Medicare Other | Admitting: Pulmonary Disease

## 2014-02-03 VITALS — BP 124/70 | HR 86 | Temp 98.1°F | Ht 72.0 in | Wt 193.6 lb

## 2014-02-03 DIAGNOSIS — R0683 Snoring: Secondary | ICD-10-CM | POA: Diagnosis not present

## 2014-02-03 NOTE — Progress Notes (Signed)
Subjective:    Patient ID: Ian Grimes, male    DOB: Aug 30, 1970, 43 y.o.   MRN: [id number]  HPI The patient is a 43 year old male who I've been asked to see for possible obstructive sleep apnea. He has been noted to have loud snoring, but no one has ever commented on witnessed apneas. He also denies choking arousals. He has frequent awakenings at night, and is not rested in the mornings upon arising. Despite this, he denies inappropriate daytime sleepiness, and denies any issues in the evenings with sleepiness while watching television or movies. He does have issues with chronic headaches at night. He also denies any sleepiness issues with driving. The patient states that his weight is down 20 pounds over the last 2 years, and his Epworth score today is 8.   Sleep Questionnaire What time do you typically go to bed?( Between what hours) 11:30 PM 11:30 PM at 1432 on 02/03/14 by Inge Rise, CMA How long does it take you to fall asleep? 1 hr 1 hr at 1432 on 02/03/14 by Inge Rise, CMA How many times during the night do you wake up? 3 3 at 1432 on 02/03/14 by Inge Rise, [place] What time do you get out of bed to start your day? 0600 0600 at 1432 on 02/03/14 by Inge Rise, CMA Do you drive or operate heavy machinery in your occupation? No No at 1432 on 02/03/14 by Inge Rise, CMA How much has your weight changed (up or down) over the past two years? (In pounds) 20 lb (9.072 kg) 20 lb (9.072 kg) at 1432 on 02/03/14 by Inge Rise, CMA Have you ever had a sleep study before? No No at 1432 on 02/03/14 by Inge Rise, CMA Do you currently use CPAP? No No at 1432 on 02/03/14 by Inge Rise, CMA Do you wear oxygen at any time? No No at 1432 on 02/03/14 by Inge Rise, CMA   Review of Systems  Constitutional: Positive for appetite change and unexpected weight change. Negative for fever.  HENT: Positive for congestion and dental problem. Negative for ear pain,  nosebleeds, postnasal drip, rhinorrhea, sinus pressure, sneezing, sore throat and trouble swallowing.   Eyes: Negative for redness and itching.  Respiratory: Positive for cough and shortness of breath. Negative for chest tightness and wheezing.   Cardiovascular: Positive for chest pain. Negative for palpitations and leg swelling.  Gastrointestinal: Negative for nausea and vomiting.  Genitourinary: Negative for dysuria.  Musculoskeletal: Negative for joint swelling.  Skin: Negative for rash.  Neurological: Positive for headaches.  Hematological: Does not bruise/bleed easily.  Psychiatric/Behavioral: Negative for dysphoric mood. The patient is not nervous/anxious.        Objective:   Physical Exam Constitutional:  Overweight male, no acute distress  HENT:  Nares patent without discharge, erythema mucosa right nare  Oropharynx without exudate, palate and uvula are significantly elongated.  Eyes:  Perrla, eomi, no scleral icterus  Neck:  No JVD, no TMG  Cardiovascular:  Normal rate, regular rhythm, no rubs or gallops.  No murmurs        Intact distal pulses  Pulmonary :  Normal breath sounds, no stridor or respiratory distress   No rales, rhonchi, or wheezing  Abdominal:  Soft, nondistended, bowel sounds present.  No tenderness noted.   Musculoskeletal:  No lower extremity edema noted.  Lymph Nodes:  No cervical lymphadenopathy noted  Skin:  No cyanosis noted  Neurologic:  Alert,  appropriate, moves all 4 extremities without obvious deficit.         Assessment & Plan:

## 2014-02-03 NOTE — Patient Instructions (Signed)
Will schedule for sleep study at [facility].  Will arrange for followup once the results are available. Work on modest weight reduction.

## 2014-02-03 NOTE — Assessment & Plan Note (Signed)
The patient has a history of loud snoring, but it is unclear whether he has true apnea. He has frequent awakenings at night, and is not rested in the mornings upon arising. This is certainly concerning for possible sleep-disordered breathing,  however he does not have issues with sleepiness during the day even with inactivity. He makes the comment that he "rests his eyes a lot", but does not fall asleep. I have had a long discussion with him about sleep apnea, including its impact to his quality of life and cardiovascular health. I have told him that we need to do a sleep study if he wishes to be evaluated for sleep apnea. I do not think he is a good candidate for home sleep testing given his history and sleep onset issues at times. The patient is hesitant, but did agree to proceed with a sleep study.

## 2014-02-06 ENCOUNTER — Encounter (HOSPITAL_COMMUNITY): Payer: Self-pay | Admitting: Cardiovascular Disease
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
## 2014-02-12 ENCOUNTER — Encounter: Payer: Self-pay | Admitting: Cardiovascular Disease

## 2014-02-12 ENCOUNTER — Ambulatory Visit (INDEPENDENT_AMBULATORY_CARE_PROVIDER_SITE_OTHER): Payer: Medicare Other | Admitting: Cardiovascular Disease

## 2014-02-12 VITALS — BP 122/77 | HR 86 | Ht 72.0 in | Wt 190.0 lb

## 2014-02-12 DIAGNOSIS — E785 Hyperlipidemia, unspecified: Secondary | ICD-10-CM

## 2014-02-12 DIAGNOSIS — I712 Thoracic aortic aneurysm, without rupture: Secondary | ICD-10-CM | POA: Diagnosis not present

## 2014-02-12 DIAGNOSIS — Z95828 Presence of other vascular implants and grafts: Secondary | ICD-10-CM | POA: Diagnosis not present

## 2014-02-12 DIAGNOSIS — Z720 Tobacco use: Secondary | ICD-10-CM

## 2014-02-12 DIAGNOSIS — I7121 Aneurysm of the ascending aorta, without rupture: Secondary | ICD-10-CM

## 2014-02-12 NOTE — Assessment & Plan Note (Signed)
Counseled for less than 10 minutes no motivation to quit  Lungs clear no active wheezing

## 2014-02-12 NOTE — Assessment & Plan Note (Signed)
Cholesterol is at goal.  Continue current dose of statin and diet Rx.  No myalgias or side effects.  F/U  LFT's in 6 months. Lab Results  Component Value Date   LDLCALC 49 05/06/2013

## 2014-02-12 NOTE — Patient Instructions (Signed)
Your physician wants you to follow-up in:  6 MONTHS WITH DR NISHAN  You will receive a reminder letter in the mail two months in advance. If you don't receive a letter, please call our office to schedule the follow-up appointment. Your physician recommends that you continue on your current medications as directed. Please refer to the Current Medication list given to you today. 

## 2014-02-12 NOTE — Assessment & Plan Note (Signed)
Post graft replacement with excellent result Descending thoracic aorta is fine

## 2014-02-12 NOTE — Assessment & Plan Note (Signed)
Despite bicuspid valve had valve sparing surgery as there was no significant AS/AR  F/U echo in a year SBE prophylaxis

## 2014-03-11 DIAGNOSIS — F329 Major depressive disorder, single episode, unspecified: Secondary | ICD-10-CM | POA: Diagnosis not present

## 2014-03-12 ENCOUNTER — Ambulatory Visit (INDEPENDENT_AMBULATORY_CARE_PROVIDER_SITE_OTHER): Payer: Medicare Other | Admitting: Family Medicine

## 2014-03-12 VITALS — BP 119/81 | HR 74 | Temp 97.1°F | Ht 72.0 in | Wt 195.2 lb

## 2014-03-12 DIAGNOSIS — J0100 Acute maxillary sinusitis, unspecified: Secondary | ICD-10-CM

## 2014-03-12 DIAGNOSIS — G44209 Tension-type headache, unspecified, not intractable: Secondary | ICD-10-CM | POA: Diagnosis not present

## 2014-03-12 MED ORDER — METHYLPREDNISOLONE (PAK) 4 MG PO TABS: ORAL_TABLET | ORAL | Status: DC

## 2014-03-12 MED ORDER — AMOXICILLIN 875 MG PO TABS: 875.0000 mg | ORAL_TABLET | Freq: Two times a day (BID) | ORAL | Status: DC

## 2014-03-12 MED ORDER — BUTALBITAL-APAP-CAFFEINE 50-325-40 MG PO TABS: 1.0000 | ORAL_TABLET | Freq: Four times a day (QID) | ORAL | Status: DC | PRN

## 2014-03-12 NOTE — Progress Notes (Signed)
   Subjective:    Patient ID: Ian Grimes, male    DOB: [birth year], 44 y.o.   MRN: [id number]  HPI Patient is here for c/o headaches and sinus congestion.  Review of Systems  Constitutional: Negative for fever.  HENT: Negative for ear pain.   Eyes: Negative for discharge.  Respiratory: Negative for cough.   Cardiovascular: Negative for chest pain.  Gastrointestinal: Negative for abdominal distention.  Endocrine: Negative for polyuria.  Genitourinary: Negative for difficulty urinating.  Musculoskeletal: Negative for gait problem and neck pain.  Skin: Negative for color change and rash.  Neurological: Negative for speech difficulty and headaches.  Psychiatric/Behavioral: Negative for agitation.       Objective:    BP 119/81 mmHg  Pulse 74  Temp(Src) 97.1 F (36.2 C) (Oral)  Ht 6' (1.829 m)  Wt 195 lb 3.2 oz (88.542 kg)  BMI 26.47 kg/m2 Physical Exam  Constitutional: He is oriented to person, place, and time. He appears well-developed and well-nourished.  HENT:  Head: Normocephalic and atraumatic.  Mouth/Throat: Oropharynx is clear and moist.  Eyes: Pupils are equal, round, and reactive to light.  Neck: Normal range of motion. Neck supple.  Cardiovascular: Normal rate and regular rhythm.   No murmur heard. Pulmonary/Chest: Effort normal and breath sounds normal.  Abdominal: Soft. Bowel sounds are normal. There is no tenderness.  Neurological: He is alert and oriented to person, place, and time.  Skin: Skin is warm and dry.  Psychiatric: He has a normal mood and affect.          Assessment & Plan:     ICD-9-CM ICD-10-CM   1. Tension-type headache, not intractable, unspecified chronicity pattern 339.10 G44.209 butalbital-acetaminophen-caffeine (FIORICET) 50-325-40 MG per tablet  2. Subacute maxillary sinusitis 461.0 J01.00 methylPREDNIsolone (MEDROL DOSPACK) 4 MG tablet     amoxicillin (AMOXIL) 875 MG tablet     No Follow-up on file.  Lysbeth Penner  FNP

## 2014-03-13 ENCOUNTER — Ambulatory Visit: Payer: Medicare Other | Admitting: Family Medicine

## 2014-03-14 ENCOUNTER — Other Ambulatory Visit: Payer: Self-pay | Admitting: *Deleted

## 2014-03-14 MED ORDER — RIVAROXABAN 20 MG PO TABS: 20.0000 mg | ORAL_TABLET | Freq: Every day | ORAL | Status: DC

## 2014-03-17 ENCOUNTER — Telehealth: Payer: Self-pay | Admitting: Family Medicine

## 2014-03-21 ENCOUNTER — Ambulatory Visit: Payer: Medicare Other | Admitting: Family Medicine

## 2014-03-26 ENCOUNTER — Ambulatory Visit (INDEPENDENT_AMBULATORY_CARE_PROVIDER_SITE_OTHER): Payer: Medicare Other | Admitting: Family Medicine

## 2014-03-26 ENCOUNTER — Encounter: Payer: Self-pay | Admitting: Family Medicine

## 2014-03-26 VITALS — BP 115/70 | HR 73 | Temp 97.6°F | Ht 72.0 in | Wt 190.8 lb

## 2014-03-26 DIAGNOSIS — J0100 Acute maxillary sinusitis, unspecified: Secondary | ICD-10-CM

## 2014-03-26 MED ORDER — AMOXICILLIN 875 MG PO TABS: 875.0000 mg | ORAL_TABLET | Freq: Two times a day (BID) | ORAL | Status: DC

## 2014-03-26 MED ORDER — FLUTICASONE PROPIONATE 50 MCG/ACT NA SUSP: 2.0000 | Freq: Every day | NASAL | Status: DC

## 2014-03-26 NOTE — Progress Notes (Signed)
   Subjective:    Patient ID: Ian Grimes, male    DOB: Jul 20, 1970, 44 y.o.   MRN: [id number]  HPI Patient is here for follow up on his headaches and sinusitis and he states he is not any better.  He was rx'd a medrol dose pack, amoxicillin, and fioricet.  He states he is still having sinus sx's.  Review of Systems  Constitutional: Negative for fever.  HENT: Negative for ear pain.   Eyes: Negative for discharge.  Respiratory: Negative for cough.   Cardiovascular: Negative for chest pain.  Gastrointestinal: Negative for abdominal distention.  Endocrine: Negative for polyuria.  Genitourinary: Negative for difficulty urinating.  Musculoskeletal: Negative for gait problem and neck pain.  Skin: Negative for color change and rash.  Neurological: Negative for speech difficulty and headaches.  Psychiatric/Behavioral: Negative for agitation.       Objective:    BP 115/70 mmHg  Pulse 73  Temp(Src) 97.6 F (36.4 C) (Oral)  Ht 6' (1.829 m)  Wt 190 lb 12.8 oz (86.546 kg)  BMI 25.87 kg/m2 Physical Exam  Constitutional: He is oriented to person, place, and time. He appears well-developed and well-nourished.  HENT:  Head: Normocephalic and atraumatic.  Mouth/Throat: Oropharynx is clear and moist.  Eyes: Pupils are equal, round, and reactive to light.  Neck: Normal range of motion. Neck supple.  Cardiovascular: Normal rate and regular rhythm.   No murmur heard. Pulmonary/Chest: Effort normal and breath sounds normal.  Abdominal: Soft. Bowel sounds are normal. There is no tenderness.  Neurological: He is alert and oriented to person, place, and time.  Skin: Skin is warm and dry.  Psychiatric: He has a normal mood and affect.          Assessment & Plan:     ICD-9-CM ICD-10-CM   1. Subacute maxillary sinusitis 461.0 J01.00 amoxicillin (AMOXIL) 875 MG tablet     fluticasone (FLONASE) 50 MCG/ACT nasal spray     No Follow-up on file.  Lysbeth Penner FNP

## 2014-04-02 ENCOUNTER — Other Ambulatory Visit: Payer: Self-pay | Admitting: Surgery

## 2014-04-03 ENCOUNTER — Other Ambulatory Visit: Payer: Self-pay

## 2014-04-03 MED ORDER — METOPROLOL TARTRATE 25 MG PO TABS: 25.0000 mg | ORAL_TABLET | Freq: Two times a day (BID) | ORAL | Status: DC

## 2014-04-29 ENCOUNTER — Telehealth: Payer: Self-pay | Admitting: Family Medicine

## 2014-04-29 DIAGNOSIS — F418 Other specified anxiety disorders: Secondary | ICD-10-CM | POA: Diagnosis not present

## 2014-04-29 DIAGNOSIS — R51 Headache: Secondary | ICD-10-CM | POA: Diagnosis not present

## 2014-04-29 NOTE — Telephone Encounter (Signed)
Patient is at urgent care being seen.

## 2014-05-13 DIAGNOSIS — R404 Transient alteration of awareness: Secondary | ICD-10-CM | POA: Diagnosis not present

## 2014-05-13 DIAGNOSIS — R531 Weakness: Secondary | ICD-10-CM | POA: Diagnosis not present

## 2014-05-30 ENCOUNTER — Other Ambulatory Visit: Payer: Self-pay | Admitting: Family Medicine

## 2014-05-30 NOTE — Telephone Encounter (Signed)
Last seen 03/26/14 B [place]  Last lipid 05/06/13

## 2014-06-25 ENCOUNTER — Ambulatory Visit (INDEPENDENT_AMBULATORY_CARE_PROVIDER_SITE_OTHER): Payer: Medicare Other | Admitting: Surgery

## 2014-06-25 ENCOUNTER — Encounter: Payer: Self-pay | Admitting: Family Medicine

## 2014-06-25 ENCOUNTER — Encounter: Payer: Self-pay | Admitting: Surgery

## 2014-06-25 ENCOUNTER — Ambulatory Visit (INDEPENDENT_AMBULATORY_CARE_PROVIDER_SITE_OTHER): Payer: Medicare Other | Admitting: Family Medicine

## 2014-06-25 VITALS — BP 125/68 | HR 69 | Temp 97.4°F | Ht 72.0 in | Wt 192.0 lb

## 2014-06-25 VITALS — BP 126/75 | HR 63 | Resp 20 | Ht 72.0 in | Wt 190.0 lb

## 2014-06-25 DIAGNOSIS — Z95828 Presence of other vascular implants and grafts: Secondary | ICD-10-CM | POA: Diagnosis not present

## 2014-06-25 DIAGNOSIS — Q231 Congenital insufficiency of aortic valve: Secondary | ICD-10-CM

## 2014-06-25 DIAGNOSIS — I7121 Aneurysm of the ascending aorta, without rupture: Secondary | ICD-10-CM

## 2014-06-25 DIAGNOSIS — R51 Headache: Secondary | ICD-10-CM

## 2014-06-25 DIAGNOSIS — Q2381 Bicuspid aortic valve: Secondary | ICD-10-CM

## 2014-06-25 DIAGNOSIS — Q239 Congenital malformation of aortic and mitral valves, unspecified: Secondary | ICD-10-CM

## 2014-06-25 DIAGNOSIS — I712 Thoracic aortic aneurysm, without rupture: Secondary | ICD-10-CM

## 2014-06-25 DIAGNOSIS — R519 Headache, unspecified: Secondary | ICD-10-CM

## 2014-06-25 MED ORDER — KETOROLAC TROMETHAMINE 60 MG/2ML IM SOLN
60.0000 mg | Freq: Once | INTRAMUSCULAR | Status: AC
  Administered 2014-06-25: 60 mg via INTRAMUSCULAR

## 2014-06-25 MED ORDER — TOPIRAMATE 25 MG PO TABS: ORAL_TABLET | ORAL | Status: DC

## 2014-06-25 MED ORDER — RIZATRIPTAN BENZOATE 10 MG PO TBDP: 10.0000 mg | ORAL_TABLET | ORAL | Status: DC | PRN

## 2014-06-25 NOTE — Addendum Note (Signed)
Addended by: Marin Olp on: 06/25/2014 07:11 PM   Modules accepted: Orders
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
## 2014-06-26 ENCOUNTER — Encounter: Payer: Self-pay | Admitting: Surgery
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
## 2014-07-01 ENCOUNTER — Other Ambulatory Visit: Payer: Self-pay | Admitting: Family Medicine

## 2014-07-02 NOTE — Telephone Encounter (Signed)
Last seen 06/25/14 Dr Livia Snellen   Last lipid 05/06/13

## 2014-07-03 ENCOUNTER — Telehealth: Payer: Self-pay | Admitting: Family Medicine

## 2014-07-09 ENCOUNTER — Ambulatory Visit (HOSPITAL_COMMUNITY)
Admission: RE | Admit: 2014-07-09 | Discharge: 2014-07-09 | Disposition: A | Discharge status: Home / Self Care | Payer: Medicare Other | Source: Ambulatory Visit | Attending: Family Medicine | Admitting: Family Medicine

## 2014-07-09 ENCOUNTER — Telehealth: Payer: Self-pay | Admitting: *Deleted

## 2014-07-09 DIAGNOSIS — R519 Headache, unspecified: Secondary | ICD-10-CM

## 2014-07-09 DIAGNOSIS — R51 Headache: Principal | ICD-10-CM

## 2014-07-09 NOTE — Telephone Encounter (Signed)
Pt called to request MRI at a facility outside of the [name] network Please schedule and advise

## 2014-07-17 ENCOUNTER — Ambulatory Visit (HOSPITAL_COMMUNITY): Admission: RE | Admit: 2014-07-17 | Payer: Medicare Other | Source: Ambulatory Visit

## 2014-08-06 DIAGNOSIS — J329 Chronic sinusitis, unspecified: Secondary | ICD-10-CM | POA: Diagnosis not present

## 2014-09-05 ENCOUNTER — Encounter: Payer: Self-pay | Admitting: Cardiovascular Disease

## 2014-09-05 ENCOUNTER — Ambulatory Visit (INDEPENDENT_AMBULATORY_CARE_PROVIDER_SITE_OTHER): Payer: Medicare Other | Admitting: Cardiovascular Disease

## 2014-09-05 VITALS — BP 124/68 | HR 66 | Ht 72.0 in | Wt 196.0 lb

## 2014-09-05 DIAGNOSIS — R011 Cardiac murmur, unspecified: Secondary | ICD-10-CM
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
## 2014-09-05 NOTE — Patient Instructions (Signed)
Medication Instructions:  NO CHANGES  Labwork: NONE  Testing/Procedures: NONE  Follow-Up: Your physician wants you to follow-up in: YEAR WITH  DR NISHAN You will receive a reminder letter in the mail two months in advance. If you don't receive a letter, please call our office to schedule the follow-up appointment.   Any Other Special Instructions Will Be Listed Below (If Applicable).   

## 2014-09-09 ENCOUNTER — Other Ambulatory Visit: Payer: Self-pay | Admitting: Family Medicine

## 2014-09-21 ENCOUNTER — Telehealth: Payer: Self-pay | Admitting: Internal Medicine

## 2014-09-21 NOTE — Telephone Encounter (Signed)
Ian Grimes first avail fu to see me for PE followup. Dr Johnsie Cancel cards saw him recently. Stopped xarelto due to completion of 1 year of PE Rx and suspect he might need hypercoag workup. I can evaluae for that and repeat d-dimer when he sees me  Thanks  Dr. Brand Males, M.D., [facility].C.P Pulmonary and Critical Care Medicine Staff Physician [facility] Pager: [phone], If no answer or between  15:00h - 7:00h: call [phone]  09/21/2014 4:19 PM

## 2014-09-22 NOTE — Telephone Encounter (Signed)
Called and spoke to pt. Appt made with MR on 9.21.16. Pt verbalized understanding and denied any further questions or concerns at this time.

## 2014-10-15 ENCOUNTER — Other Ambulatory Visit: Payer: Self-pay | Admitting: Family Medicine

## 2014-10-15 NOTE — Telephone Encounter (Signed)
Last seen 06/25/14  Dr Livia Snellen  Last lipid 05/06/13

## 2014-11-19 ENCOUNTER — Encounter: Payer: Self-pay | Admitting: Internal Medicine

## 2014-11-19 ENCOUNTER — Ambulatory Visit (INDEPENDENT_AMBULATORY_CARE_PROVIDER_SITE_OTHER): Payer: Medicare Other | Admitting: Internal Medicine

## 2014-11-19 ENCOUNTER — Other Ambulatory Visit: Payer: Medicare Other

## 2014-11-19 VITALS — BP 122/68 | HR 94 | Ht 72.0 in | Wt 201.8 lb

## 2014-11-19 DIAGNOSIS — Z720 Tobacco use: Secondary | ICD-10-CM

## 2014-11-19 DIAGNOSIS — J439 Emphysema, unspecified: Secondary | ICD-10-CM | POA: Diagnosis not present

## 2014-11-19 DIAGNOSIS — I2699 Other pulmonary embolism without acute cor pulmonale: Secondary | ICD-10-CM

## 2014-11-19 DIAGNOSIS — J449 Chronic obstructive pulmonary disease, unspecified: Secondary | ICD-10-CM | POA: Insufficient documentation

## 2014-11-19 NOTE — Patient Instructions (Addendum)
#  Pulmonary embolism  - do hypercoag workup including d-dimer to decide about xaretlo retart   #smoking and CT scan evidence of emphysema  - try to quit please because CT chest does show very early evidence of emphysema already  - do alpha 1 genetic test for copd  #Followup -will call with test results to decide followup d

## 2014-11-19 NOTE — Progress Notes (Signed)
Subjective:    Patient ID: Ian Grimes, male    DOB: 04-18-1970, 44 y.o.   MRN: [id number]  HPI       IOV 06/12/2013  Chief Complaint  Patient presents with  . Advice Only    Referred for pulmonary nodule.  Had CT chest at the end of March.  Has no breathing complaints at this time.    44 year old smoker with significant psychiatric history but a fairly good historian also with anxiety, hyperlipidemia and smoking. He was admitted 05/06/2013 with 3 week history of dyspnea. Was diagnosed with bilateral pulmonary embolism. Treated by the hospitalist service. He was on 2 L nasal cannula with a respirator 30 per minute at the time of admission. He was discharged 05/08/2013 on xarelto. Since then he has returned to baseline health. He is followed up with his primary care physician. He says that for the last 1 week he is not taking his xarelto because his primary care physician apparently told him that the refills are actually once a month. He is also confused that his primary care physician has been regarding that he is on Coumadin but he insists that he wants on xarelto up until one week ago and that there was no change in plan to take any other medication. He had tolerated xarelto well without any problems of bleeding or compliance issues.  CT scan of the chest at that time should 8 mm subpleural nodule in the lateral segment of the right middle lobe and that is the main reason for referral today  Other she smoking: He continues to smoke and is unable to quit.  reports that he has been smoking Cigarettes.  He has a 22 pack-year smoking history. He has never used smokeless tobacco.  IMPRESSION:   CT chest 05/06/13 Segmental pulmonary emboli within all lobes. Overall clot burden is  moderate.  8 x 6 mm triangular subpleural nodule in the lateral right middle  lobe, corresponding to the radiographic abnormality, favored to  reflect a benign subpleural lymph node but technically    indeterminate. Given patient's high risk for primary bronchogenic  neoplasm, initial follow-up CT chest is suggested in 6 months.  Faint ground-glass nodular opacities in the left upper lobe/lingula,  possibly infectious/inflammatory.  Trace right pleural effusion.  Nodule recommendation follows the consensus statement: Guidelines  for Management of Small Pulmonary Nodules Detected on CT Scans: A  Statement from the [employer] [place] as published in Radiology  2005; 237:395-400.  Critical value/emergent results were called by telephone at the time  of interpretation on 05/06/2013 at 9:12 AM to [facility], who  verbally acknowledged these results.  Electronically Signed  By: Julian Hy M.D.  On: 05/06/2013 09:35         OV 08/19/2013  Chief Complaint  Patient presents with  . Follow-up    Pt states breathing is unchanged. Pt states he only coughs to clear his throat. Pt states he constant sinus headaches. Pt denies SOB and CP.     Folllowup   Smoking: still smokes. Struggling to q uit. Depression makes it harder to quit  PE: back on xaretlo 20mg  per day after med review. He has no problems. MOwed yard 4h and no dyspnea   Lung nodule: has resolved June 2015 CT chest but new finding: Ascending Aorta Aneurumsm 4.5cm on CT chest   IMPRESSION:  1. No suspicious pulmonary nodules. Subpleural density previously  noted at the right lung base has resolved, likely atelectasis or  sequela  of pulmonary embolism demonstrated on prior examination.  2. Mild centrilobular and paraseptal emphysema.  3. Ascending aortic aneurysm appears mildly enlarged compared with  the prior study. Of note, previously demonstrated pulmonary embolism  is not addressed on this noncontrast study.  Electronically Signed  By: Camie Patience M.D.  On: 08/07/2013 12:30     OV 11/19/2014  Chief Complaint  Patient presents with  . Follow-up    Pt c/o mild DOE, prod cough with clear mucus. Pt  deneis CP/tightness.     SMoking:  reports that he has been smoking Cigarettes.  He has a 22 pack-year smoking history. He has never used smokeless tobacco. CT scan of the chest in 2015 did show some subclinical emphysema  Pulmonary embolism: This was diagnosed in the spring of 2015. He completed one year Xarelto therapy earlier this year and I believe either under the instructions of primary care physician or cardiologist he came off therapy. He has since been referred here to follow-up. He feels fine without any chest pain hemoptysis syncope cough or dyspnea. The only amount of dyspnea he has this when he initially starts walking but as he warms up he does not have any dyspnea. His effort tolerance is good. Cardiologist wondered about hypercoagulable workup.    Current outpatient prescriptions:  .  aspirin 81 MG tablet, Take 81 mg by mouth daily., Disp: , Rfl:  .  ezetimibe (ZETIA) 10 MG tablet, Take 10 mg by mouth daily., Disp: , Rfl:  .  fenofibrate micronized (ANTARA) 130 MG capsule, Take 1 capsule (130 mg total) by mouth daily before breakfast., Disp: 30 capsule, Rfl: 5 .  Melatonin 5 MG CAPS, Take 5 mg by mouth at bedtime., Disp: , Rfl:  .  metoprolol tartrate (LOPRESSOR) 25 MG tablet, Take 1 tablet (25 mg total) by mouth 2 (two) times daily., Disp: 60 tablet, Rfl: 2 .  pravastatin (PRAVACHOL) 40 MG tablet, TAKE 1 TABLET DAILY, Disp: 90 tablet, Rfl: 0 .  butalbital-acetaminophen-caffeine (FIORICET) 50-325-40 MG per tablet, Take 1-2 tablets by mouth every 6 (six) hours as needed for headache. (Patient not taking: Reported on 06/25/2014), Disp: 20 tablet, Rfl: 0    Review of Systems  Constitutional: Negative for fever and unexpected weight change.  HENT: Positive for sinus pressure. Negative for congestion, dental problem, ear pain, nosebleeds, postnasal drip, rhinorrhea, sneezing, sore throat and trouble swallowing.   Eyes: Negative for redness and itching.  Respiratory: Positive for  cough and shortness of breath. Negative for chest tightness and wheezing.   Cardiovascular: Negative for palpitations and leg swelling.  Gastrointestinal: Negative for nausea and vomiting.  Genitourinary: Negative for dysuria.  Musculoskeletal: Negative for joint swelling.  Skin: Negative for rash.  Neurological: Positive for headaches.  Hematological: Does not bruise/bleed easily.  Psychiatric/Behavioral: Negative for dysphoric mood. The patient is not nervous/anxious.        Objective:   Physical Exam  Constitutional: He is oriented to person, place, and time. He appears well-developed and well-nourished. No distress.  HENT:  Head: Normocephalic and atraumatic.  Right Ear: External ear normal.  Left Ear: External ear normal.  Mouth/Throat: Oropharynx is clear and moist. No oropharyngeal exudate.  Eyes: Conjunctivae and EOM are normal. Pupils are equal, round, and reactive to light. Right eye exhibits no discharge. Left eye exhibits no discharge. No scleral icterus.  Neck: Normal range of motion. Neck supple. No JVD present. No tracheal deviation present. No thyromegaly present.  Cardiovascular: Normal rate, regular rhythm and intact distal pulses.  Exam reveals no gallop and no friction rub.   No murmur heard. Pulmonary/Chest: Effort normal and breath sounds normal. No respiratory distress. He has no wheezes. He has no rales. He exhibits no tenderness.  Abdominal: Soft. Bowel sounds are normal. He exhibits no distension and no mass. There is no tenderness. There is no rebound and no guarding.  Musculoskeletal: Normal range of motion. He exhibits no edema or tenderness.  Lymphadenopathy:    He has no cervical adenopathy.  Neurological: He is alert and oriented to person, place, and time. He has normal reflexes. No cranial nerve deficit. Coordination normal.  Skin: Skin is warm and dry. No rash noted. He is not diaphoretic. No erythema. No pallor.  tatoo on left hand and distal forearm   Psychiatric: He has a normal mood and affect. His behavior is normal. Judgment and thought content normal.  Nursing note and vitals reviewed.    Filed Vitals:   11/19/14 1356  BP: 122/68  Pulse: 94  Height: 6' (1.829 m)  Weight: 201 lb 12.8 oz (91.536 kg)  SpO2: 98%        Assessment & Plan:     ICD-9-CM ICD-10-CM   1. Pulmonary emboli 415.19 I26.99 D-Dimer, Quantitative     Hypercoagulable panel, comprehensive     Alpha-1 antitrypsin phenotype  2. Tobacco use 305.1 Z72.0 D-Dimer, Quantitative     Hypercoagulable panel, comprehensive     Alpha-1 antitrypsin phenotype  3. Pulmonary emphysema, unspecified emphysema type 492.8 J43.9 Alpha-1 antitrypsin phenotype     #Pulmonary embolism  - do hypercoag workup including d-dimer to decide about xarelto resttart  #smoking and CT scan evidence of emphysema  - try to quit please because CT chest does show very early evidence of emphysema already  - do alpha 1 genetic test for copd  #Followup -will call with test results to decide followup  '  Dr. Brand Males, M.D., [facility].C.P Pulmonary and Critical Care Medicine Staff Physician [place] [facility] Pager: [phone], If no answer or between  15:00h - 7:00h: call [phone]  11/19/2014 2:33 PM

## 2014-11-20 ENCOUNTER — Encounter: Payer: Self-pay | Admitting: Family Medicine

## 2014-11-20 ENCOUNTER — Telehealth: Payer: Self-pay

## 2014-11-20 ENCOUNTER — Ambulatory Visit (INDEPENDENT_AMBULATORY_CARE_PROVIDER_SITE_OTHER): Payer: Medicare Other | Admitting: Family Medicine

## 2014-11-20 VITALS — BP 113/76 | HR 71 | Temp 98.1°F | Ht 73.83 in | Wt 200.4 lb

## 2014-11-20 DIAGNOSIS — G44209 Tension-type headache, unspecified, not intractable: Secondary | ICD-10-CM

## 2014-11-20 DIAGNOSIS — Z0000 Encounter for general adult medical examination without abnormal findings: Secondary | ICD-10-CM

## 2014-11-20 DIAGNOSIS — F259 Schizoaffective disorder, unspecified: Secondary | ICD-10-CM

## 2014-11-20 LAB — D-DIMER, QUANTITATIVE: D-Dimer, Quant: 0.33 ug/mL-FEU (ref 0.00–0.48)

## 2014-11-20 MED ORDER — BUTALBITAL-APAP-CAFFEINE 50-325-40 MG PO TABS: 1.0000 | ORAL_TABLET | Freq: Every day | ORAL | Status: DC | PRN

## 2014-11-20 NOTE — Progress Notes (Signed)
Subjective:    Ian Grimes is a 44 y.o. male who presents for Medicare Annual/Subsequent preventive examination.    History of present illness Patient describes a typical headaches. States that they alternate from day-to-day from his right-sided maxillary sinus is left-sided maxillary sinus extending from his paranasal area back to his occipital area. They've been treated as sinus infections previously with no improvement. He has tried Fioricet several months ago which helped him and he requests a refill today.  He denies any loss of consciousness or weakness or numbness with these headaches. He does have photophobia. He also has some nausea occasionally.  He's previously been diagnosed with schizoaffective disorder and states that he does not have a psychiatrist currently but would like one.   Preventive Screening-Counseling & Management  Tobacco History  Smoking status  . Current Every Day Smoker -- 1.00 packs/day for 22 years  . Types: Cigarettes  Smokeless tobacco  . Never Used    Problems Prior to Visit 1. See below   Current Problems (verified) Patient Active Problem List   Diagnosis Date Noted  . Emphysema of lung 11/19/2014  . Snoring 02/03/2014  . S/P ascending aortic replacement 11/19/2013  . Congenital bicuspid aortic valve 11/07/2013  . Pulmonary emboli 05/06/2013  . Tobacco use 05/06/2013  . Hyperlipidemia LDL goal < 100 01/18/2013  . Depression 07/17/2012  . Schizoaffective disorder 07/17/2012    Medications Prior to Visit Current Outpatient Prescriptions on File Prior to Visit  Medication Sig Dispense Refill  . aspirin 81 MG tablet Take 81 mg by mouth daily.    Marland Kitchen ezetimibe (ZETIA) 10 MG tablet Take 10 mg by mouth daily.    . fenofibrate micronized (ANTARA) 130 MG capsule Take 1 capsule (130 mg total) by mouth daily before breakfast. 30 capsule 5  . Melatonin 5 MG CAPS Take 5 mg by mouth at bedtime.    . metoprolol tartrate (LOPRESSOR) 25 MG tablet  Take 1 tablet (25 mg total) by mouth 2 (two) times daily. 60 tablet 2  . pravastatin (PRAVACHOL) 40 MG tablet TAKE 1 TABLET DAILY 90 tablet 0  . butalbital-acetaminophen-caffeine (FIORICET) 50-325-40 MG per tablet Take 1-2 tablets by mouth every 6 (six) hours as needed for headache. (Patient not taking: Reported on 06/25/2014) 20 tablet 0   No current facility-administered medications on file prior to visit.    Current Medications (verified) Current Outpatient Prescriptions  Medication Sig Dispense Refill  . aspirin 81 MG tablet Take 81 mg by mouth daily.    Marland Kitchen ezetimibe (ZETIA) 10 MG tablet Take 10 mg by mouth daily.    . fenofibrate micronized (ANTARA) 130 MG capsule Take 1 capsule (130 mg total) by mouth daily before breakfast. 30 capsule 5  . Melatonin 5 MG CAPS Take 5 mg by mouth at bedtime.    . metoprolol tartrate (LOPRESSOR) 25 MG tablet Take 1 tablet (25 mg total) by mouth 2 (two) times daily. 60 tablet 2  . pravastatin (PRAVACHOL) 40 MG tablet TAKE 1 TABLET DAILY 90 tablet 0  . butalbital-acetaminophen-caffeine (FIORICET) 50-325-40 MG per tablet Take 1-2 tablets by mouth every 6 (six) hours as needed for headache. (Patient not taking: Reported on 06/25/2014) 20 tablet 0   No current facility-administered medications for this visit.     Allergies (verified) Bactrim   PAST HISTORY  Family History Family History  Problem Relation Age of Onset  . Heart disease Father   . Stroke Father   . Cancer Father  lung  . Heart attack Father   . COPD Father   . COPD Paternal 40   . Asthma Paternal Aunt   . Stroke Paternal Uncle   . Diabetes Paternal Grandfather     Social History Social History  Substance Use Topics  . Smoking status: Current Every Day Smoker -- 1.00 packs/day for 22 years    Types: Cigarettes  . Smokeless tobacco: Never Used  . Alcohol Use: No     Comment: drinks 1 can of beer/month.      Are there smokers in your home (other than you)?  No  Risk  Factors Current exercise habits: walking 2-3 day s per week approx 2 miles  Dietary issues discussed: None   Cardiac risk factors: male gender and smoking/ tobacco exposure.  Depression Screen (Note: if answer to either of the following is "Yes", a more complete depression screening is indicated)   Q1: Over the past two weeks, have you felt down, depressed or hopeless? Yes  Q2: Over the past two weeks, have you felt little interest or pleasure in doing things? Yes  Have you lost interest or pleasure in daily life? Yes  Do you often feel hopeless? Yes  Do you cry easily over simple problems? No  Activities of Daily Living In your present state of health, do you have any difficulty performing the following activities?:  Driving? No Managing money?  No Feeding yourself? No Getting from bed to chair? No Climbing a flight of stairs? No Preparing food and eating?: No Bathing or showering? No Getting dressed: No Getting to the toilet? No Using the toilet:No Moving around from place to place: No In the past year have you fallen or had a near fall?:No   Are you sexually active?  No  Do you have more than one partner?  No  Hearing Difficulties: No Do you often ask people to speak up or repeat themselves? No Do you experience ringing or noises in your ears? No Do you have difficulty understanding soft or whispered voices? No   Do you feel that you have a problem with memory? No  Do you often misplace items? No  Do you feel safe at home?  No  Cognitive Testing  Alert? Yes  Normal Appearance?Yes  Oriented to person? Yes  Place? Yes   Time? Yes  Recall of three objects?  Yes  Can perform simple calculations? Yes  Displays appropriate judgment?Yes  Can read the correct time from a watch face?Yes   Advanced Directives have been discussed with the patient? Yes   List the Names of Other Physician/Practitioners you currently use: 1.  Dr. Chase Caller, Pulmonology 2.  Dr. Johnsie Cancel-  cardiology 3. Dr. Cyndia Bent- CVTS surgery  Indicate any recent Medical Services you may have received from other than [facility] providers in the past year (date may be approximate).   There is no immunization history on file for this patient.  Screening Tests Health Maintenance  Topic Date Due  . HIV Screening  08/24/1985  . TETANUS/TDAP  08/24/1989  . INFLUENZA VACCINE  09/29/2014    All answers were reviewed with the patient and necessary referrals were made:  Kenn File, MD   11/20/2014   History reviewed: allergies, current medications, past family history, past medical history, past social history, past surgical history and problem list  Review of Systems Pertinent items are noted in HPI.    Objective:       Blood pressure 113/76, pulse 71, temperature 98.1 F (36.7  C), temperature source Oral, height 6' 1.83" (1.875 m), weight 200 lb 6.4 oz (90.901 kg). Body mass index is 25.86 kg/(m^2).  Gen: NAD, alert, cooperative with exam HEENT: NCAT, EOMI, PERRL CV: RRR, good S1/S2, no murmur Resp: CTABL, no wheezes, non-labored Ext: No edema, warm Neuro: Alert and oriented, No gross deficits  PHQ-9 = 14, no SI       Assessment:     Mr. Ramaker is a very pleasant 44 year old male with schizoaffective disorder. He states that he feels well and is doing okay overall. He does have a major issue with headaches which she is following up with his PCP for. He requests a refill of Fioricet today which has been the only medication that has helped him with his headaches.  He also has a history of pulmonary embolism. He states that it was in March 2015 and that he was treated with anticoagulation for one year.   Schizoaffective disorder He does seem to have issues with significant depression, he denies suicidality Will refer to psychiatry as he was previously treated with a complex psychotropic combination. He may also benefit from a psychiatric medication like Depakote by me controlled  migraines as well.  Headaches Recommended close follow-up with his PCP, no red flags Refilled Fioricet  Pulmonary embolism He is previously treated for around one year with anticoagulation per his report. He states that yesterday he was told to restart Xarelto by his pulmonologist but no refills were sent. He states that he would like to defer treatment if possible which she has previously discussed with other physicians. I recommended calling back to clarify with his pulmonologist as there are some confusion about dates on his AVS.  I have called and discussed this with Dr. Posey Rea, he is ok stopping xarelto s he has been tretaed for 1+ year and has a normal D dimer now.    Plan:     During the course of the visit the patient was educated and counseled about appropriate screening and preventive services including:    Advanced directives discussed  Diet review for nutrition referral? No    Patient Instructions (the written plan) was given to the patient.  Medicare Attestation I have personally reviewed: The patient's medical and social history Their use of alcohol, tobacco or illicit drugs Their current medications and supplements The patient's functional ability including ADLs,fall risks, home safety risks, cognitive, and hearing and visual impairment Diet and physical activities Evidence for depression or mood disorders  The patient's weight, height, BMI, and visual acuity have been recorded in the chart.  I have made referrals, counseling, and provided education to the patient based on review of the above and I have provided the patient with a written personalized care plan for preventive services.     Kenn File, MD   11/20/2014

## 2014-11-20 NOTE — Telephone Encounter (Signed)
Called to let pt know it was okay to stop his xarelto per Dr.Bradshaw after he talked to pt's pulmonologist.

## 2014-11-20 NOTE — Patient Instructions (Signed)
Great to meet you!  Come back in 3-4 weeks to see Dr. Livia Snellen about your headaches.

## 2014-11-22 LAB — RFX PTT-LA W/RFX TO HEX PHASE CONF: PTT-LA SCREEN: 36 s (ref <=40)

## 2014-11-22 LAB — RFX DRVVT SCR W/RFLX CONF 1:1 MIX: dRVVT Screen: 40 s (ref <=45)

## 2014-11-24 LAB — HYPERCOAGULABLE PANEL, COMPREHENSIVE
ANTICARDIOLIPIN IGM: 5 [MPL'U]/mL (ref <11)
AntiThromb III Func: 83 % activity (ref 80–120)
Anticardiolipin IgA: 4 APL U/mL (ref <22)
Anticardiolipin IgG: 25 GPL U/mL — ABNORMAL HIGH (ref <23)
BETA 2 GLYCO I IGG: 1 G Units (ref <20)
BETA-2-GLYCOPROTEIN I IGA: 7 A Units (ref <20)
BETA-2-GLYCOPROTEIN I IGM: 4 M Units (ref <20)
PROTEIN C ACTIVITY: 93 % (ref 70–180)
PROTEIN S ACTIVITY: 114 % (ref 70–150)
PROTEIN S ANTIGEN, TOTAL: 99 % (ref 70–140)
Protein C Antigen: 99 % (ref 70–140)

## 2014-11-24 LAB — ALPHA-1 ANTITRYPSIN PHENOTYPE: A-1 Antitrypsin: 76 mg/dL — ABNORMAL LOW (ref 83–199)

## 2014-11-30 ENCOUNTER — Telehealth: Payer: Self-pay | Admitting: Internal Medicine

## 2014-11-30 NOTE — Telephone Encounter (Signed)
Ian Grimes  I mis-typed OV instruction - due to pull down from prior OV. Anyways, plan is to hold xaretlo pending hypercoag rsults workup. D-dimr result back - normal. So ok to hold xarelto for 6 months and resasess with d-dimer in 6 months Give fu with d-dimer in 6 months

## 2014-12-01 NOTE — Telephone Encounter (Signed)
lmtcb for pt.  

## 2014-12-02 NOTE — Telephone Encounter (Signed)
Patient returned call and may be reached at [phone].

## 2014-12-02 NOTE — Telephone Encounter (Signed)
Spoke with pt, aware of below recs.  Recall placed for pt.  Nothing further needed at this time.

## 2014-12-12 ENCOUNTER — Telehealth (HOSPITAL_COMMUNITY): Payer: Self-pay | Admitting: *Deleted

## 2014-12-16 ENCOUNTER — Other Ambulatory Visit: Payer: Self-pay | Admitting: Family Medicine

## 2014-12-16 ENCOUNTER — Other Ambulatory Visit: Payer: Self-pay | Admitting: Family

## 2014-12-16 NOTE — Telephone Encounter (Signed)
Last seen 11/20/14  Dr Wendi Snipes   Last lipid  05/06/13

## 2015-01-14 ENCOUNTER — Other Ambulatory Visit: Payer: Self-pay | Admitting: Family Medicine

## 2015-01-14 ENCOUNTER — Telehealth: Payer: Self-pay | Admitting: Family Medicine

## 2015-01-14 DIAGNOSIS — Z79899 Other long term (current) drug therapy: Secondary | ICD-10-CM | POA: Diagnosis not present

## 2015-01-14 DIAGNOSIS — G44009 Cluster headache syndrome, unspecified, not intractable: Secondary | ICD-10-CM | POA: Diagnosis not present

## 2015-01-14 DIAGNOSIS — F17200 Nicotine dependence, unspecified, uncomplicated: Secondary | ICD-10-CM | POA: Diagnosis not present

## 2015-01-15 ENCOUNTER — Other Ambulatory Visit: Payer: Self-pay

## 2015-01-15 NOTE — Telephone Encounter (Signed)
Covering for PCP (also I have seen)  Needs lipid panel but will refill meds  Laroy Apple, MD [facility] 01/15/2015, 2:36 PM

## 2015-01-15 NOTE — Telephone Encounter (Signed)
Last seen 11/20/14  Dr Bradshaw   Last lipid  05/06/13 

## 2015-01-15 NOTE — Telephone Encounter (Signed)
rx called to pharmacy 

## 2015-01-15 NOTE — Telephone Encounter (Signed)
Last seen 06/25/14 Dr Stacks   Last lipid 05/06/13 

## 2015-01-15 NOTE — Telephone Encounter (Signed)
Authorize 30 days only. Then contact the patient letting them know that they will need an appointment before any further prescriptions can be sent in. 

## 2015-01-16 NOTE — Telephone Encounter (Signed)
Patient aware that he will need to seen.

## 2015-02-09 ENCOUNTER — Telehealth: Payer: Self-pay | Admitting: Internal Medicine

## 2015-02-09 NOTE — Telephone Encounter (Signed)
Ian Grimes J Barraco -  Alpha 1 from 2 mo ago is LOW. When he comes for next OV he definitely needs PFT. Pls o share the low alpha 1 result with him. FU per plan from [place] in sept 2016  Thanks  Dr. Brand Males, M.D., F.C.C.P Pulmonary and Critical Care Medicine Staff Physician [facility] Pager: [phone], If no answer or between  15:00h - 7:00h: call [phone]  02/09/2015 5:51 AM

## 2015-02-09 NOTE — Telephone Encounter (Signed)
LMTCB x 1 

## 2015-02-10 NOTE — Telephone Encounter (Signed)
lmtcb X2 for pt.  

## 2015-02-11 NOTE — Telephone Encounter (Signed)
LMTCB

## 2015-02-12 NOTE — Telephone Encounter (Signed)
LMTCB x 4 for pt

## 2015-02-13 NOTE — Telephone Encounter (Signed)
Spoke with pt. Advised him of Alpha 1 results. OV has been scheduled for 06/01/15 for 1:30pm.

## 2015-02-16 ENCOUNTER — Other Ambulatory Visit: Payer: Self-pay | Admitting: Family Medicine

## 2015-02-16 NOTE — Telephone Encounter (Signed)
Patient had office visit in December, but last CMP in April 2016, no lipid panel.  Please advise and route to pools

## 2015-03-18 ENCOUNTER — Ambulatory Visit (HOSPITAL_COMMUNITY): Payer: Self-pay | Admitting: Psychiatry

## 2015-03-25 ENCOUNTER — Other Ambulatory Visit: Payer: Self-pay

## 2015-03-25 NOTE — Telephone Encounter (Signed)
Last seen 11/20/14  Dr Bradshaw   Last lipid  05/06/13 

## 2015-03-26 MED ORDER — EZETIMIBE 10 MG PO TABS: 10.0000 mg | ORAL_TABLET | Freq: Every day | ORAL | Status: DC

## 2015-03-26 NOTE — Telephone Encounter (Signed)
Left detailed message stating rx sent to pharmacy and that he should schedule to see stacks in 3-4 months and to CB with any questions or concerns.

## 2015-06-01 ENCOUNTER — Encounter: Payer: Self-pay | Admitting: Internal Medicine

## 2015-06-01 ENCOUNTER — Other Ambulatory Visit: Payer: Self-pay | Admitting: Family Medicine

## 2015-06-01 ENCOUNTER — Ambulatory Visit (INDEPENDENT_AMBULATORY_CARE_PROVIDER_SITE_OTHER): Payer: Medicare Other | Admitting: Internal Medicine

## 2015-06-01 VITALS — BP 144/88 | HR 104 | Ht 72.0 in | Wt 220.4 lb

## 2015-06-01 DIAGNOSIS — Z86711 Personal history of pulmonary embolism: Secondary | ICD-10-CM

## 2015-06-01 DIAGNOSIS — Z720 Tobacco use: Secondary | ICD-10-CM

## 2015-06-01 DIAGNOSIS — E8801 Alpha-1-antitrypsin deficiency: Secondary | ICD-10-CM | POA: Insufficient documentation

## 2015-06-01 DIAGNOSIS — J439 Emphysema, unspecified: Secondary | ICD-10-CM | POA: Diagnosis not present

## 2015-06-01 DIAGNOSIS — Z87891 Personal history of nicotine dependence: Secondary | ICD-10-CM

## 2015-06-01 NOTE — Progress Notes (Signed)
Subjective:     Patient ID: Ian Grimes, male   DOB: 1970-08-13, 45 y.o.   MRN: [id number]  HPI    IOV 06/12/2013  Chief Complaint  Patient presents with  . Advice Only    Referred for pulmonary nodule.  Had CT chest at the end of March.  Has no breathing complaints at this time.    45 year old smoker with significant psychiatric history but a fairly good historian also with anxiety, hyperlipidemia and smoking. He was admitted 05/06/2013 with 3 week history of dyspnea. Was diagnosed with bilateral pulmonary embolism. Treated by the hospitalist service. He was on 2 L nasal cannula with a respirator 30 per minute at the time of admission. He was discharged 05/08/2013 on xarelto. Since then he has returned to baseline health. He is followed up with his primary care physician. He says that for the last 1 week he is not taking his xarelto because his primary care physician apparently told him that the refills are actually once a month. He is also confused that his primary care physician has been regarding that he is on Coumadin but he insists that he wants on xarelto up until one week ago and that there was no change in plan to take any other medication. He had tolerated xarelto well without any problems of bleeding or compliance issues.  CT scan of the chest at that time should 8 mm subpleural nodule in the lateral segment of the right middle lobe and that is the main reason for referral today  Other she smoking: He continues to smoke and is unable to quit.  reports that he has been smoking Cigarettes.  He has a 22 pack-year smoking history. He has never used smokeless tobacco.  IMPRESSION:   CT chest 05/06/13 Segmental pulmonary emboli within all lobes. Overall clot burden is  moderate.  8 x 6 mm triangular subpleural nodule in the lateral right middle  lobe, corresponding to the radiographic abnormality, favored to  reflect a benign subpleural lymph node but technically  indeterminate. Given  patient's high risk for primary bronchogenic  neoplasm, initial follow-up CT chest is suggested in 6 months.  Faint ground-glass nodular opacities in the left upper lobe/lingula,  possibly infectious/inflammatory.  Trace right pleural effusion.  Nodule recommendation follows the consensus statement: Guidelines  for Management of Small Pulmonary Nodules Detected on CT Scans: A  Statement from the [employer] as published in Radiology  2005; 237:395-400.  Critical value/emergent results were called by telephone at the time  of interpretation on 05/06/2013 at 9:12 AM to [facility], who  verbally acknowledged these results.  Electronically Signed  By: Julian Hy M.D.  On: 05/06/2013 09:35         OV 08/19/2013  Chief Complaint  Patient presents with  . Follow-up    Pt states breathing is unchanged. Pt states he only coughs to clear his throat. Pt states he constant sinus headaches. Pt denies SOB and CP.     Folllowup   Smoking: still smokes. Struggling to q uit. Depression makes it harder to quit  PE: back on xaretlo 20mg  per day after med review. He has no problems. MOwed yard 4h and no dyspnea   Lung nodule: has resolved June 2015 CT chest but new finding: Ascending Aorta Aneurumsm 4.5cm on CT chest   IMPRESSION:  1. No suspicious pulmonary nodules. Subpleural density previously  noted at the right lung base has resolved, likely atelectasis or  sequela of pulmonary embolism demonstrated on  prior examination.  2. Mild centrilobular and paraseptal emphysema.  3. Ascending aortic aneurysm appears mildly enlarged compared with  the prior study. Of note, previously demonstrated pulmonary embolism  is not addressed on this noncontrast study.  Electronically Signed  By: Camie Patience M.D.  On: 08/07/2013 12:30     OV 11/19/2014  Chief Complaint  Patient presents with  . Follow-up    Pt c/o mild DOE, prod cough with clear mucus. Pt deneis CP/tightness.      SMoking:  reports that he has been smoking Cigarettes.  He has a 22 pack-year smoking history. He has never used smokeless tobacco. CT scan of the chest in 2015 did show some subclinical emphysema  Pulmonary embolism: This was diagnosed in the spring of 2015. He completed one year Xarelto therapy earlier this year and I believe either under the instructions of primary care physician or cardiologist he came off therapy. He has since been referred here to follow-up. He feels fine without any chest pain hemoptysis syncope cough or dyspnea. The only amount of dyspnea he has this when he initially starts walking but as he warms up he does not have any dyspnea. His effort tolerance is good. Cardiologist wondered about hypercoagulable workup.     OV 06/01/2015  Chief Complaint  Patient presents with  . Follow-up    Pt here for 6 months f/u. Pt states his breathing is unchanged since last OV. Pt c/o prod cough with clear thin mucus and chest tightness at random times.     45 year old male  0- has subclinical emphysema: CT chest June 2015 without any nodules. He reports ongoing shortness of breath with exertion for walking over a block on level ground. This is stable. Insidious onset for the last 3-4 years. Associated with being overweight. No  Pulmonary function tests this visit  - Alpha 1 genetic deficiency: Last visit I checked his alpha-1. His level was 76. Phenotype was  SZ and abnormal. Shed results with him again  - Pulmonary embolism: Completed one year of therapy summer 2016 since then no clinical evidence of pulmonary embolism or DVT. He continues on aspirin therapy. D-dimer at last visit was normal suggesting recurrence risk is low  - Smoking: He continues to smoke. He knows he needs to quit but is finding it difficult  Past medical history: He has chronic headaches. He canceled an MRI due to concern of prosthesis and acyanotic. He'll be with his neurologist again    has a past  medical history of Schizo-affective psychosis ([place]); Anxiety; Depression; HA (headache); Hyperlipidemia; Pulmonary emboli ([place]); Pulmonary nodule; Pleural effusion; and echocardiogram.   reports that he has been smoking Cigarettes.  He has a 22 pack-year smoking history. He has never used smokeless tobacco.  Past Surgical History  Procedure Laterality Date  . Rib injury    . Cardiac catheterization    . Intraoperative transesophageal echocardiogram N/A 11/14/2013    Procedure: INTRAOPERATIVE TRANSESOPHAGEAL ECHOCARDIOGRAM;  Surgeon: Gaye Pollack, MD;  [facility];  Service: Open Heart Surgery;  Laterality: N/A;  . Ascending aortic root replacement N/A 11/14/2013    Procedure: Value sparing Root replacement, ASCENDING AORTIC ROOT REPLACEMENT, circ Arrest;  Surgeon: Gaye Pollack, MD;  Location: [facility] OR;  Service: Open Heart Surgery;  Laterality: N/A;  . Left and right heart catheterization with coronary angiogram N/A 11/01/2013    Procedure: LEFT AND RIGHT HEART CATHETERIZATION WITH CORONARY ANGIOGRAM;  Surgeon: Josue Hector, MD;  Location: [facility] CATH LAB;  Service:  Cardiovascular;  Laterality: N/A;    Allergies  Allergen Reactions  . Bactrim Rash     There is no immunization history on file for this patient.  Family History  Problem Relation Age of Onset  . Heart disease Father   . Stroke Father   . Cancer Father     lung  . Heart attack Father   . COPD Father   . COPD Paternal 19   . Asthma Paternal Aunt   . Stroke Paternal Uncle   . Diabetes Paternal Grandfather      Current outpatient prescriptions:  .  aspirin 81 MG tablet, Take 81 mg by mouth daily., Disp: , Rfl:  .  butalbital-acetaminophen-caffeine (FIORICET) 50-325-40 MG per tablet, Take 1-2 tablets by mouth daily as needed for headache., Disp: 20 tablet, Rfl: 0 .  ezetimibe (ZETIA) 10 MG tablet, Take 1 tablet (10 mg total) by mouth daily., Disp: 90 tablet, Rfl: 1 .  fenofibrate micronized (ANTARA) 130 MG capsule,  TAKE 1 CAPSULE ONCE DAILY AT BREAKFAST., Disp: 90 capsule, Rfl: 4 .  Melatonin 5 MG CAPS, Take 5 mg by mouth at bedtime., Disp: , Rfl:  .  metoprolol tartrate (LOPRESSOR) 25 MG tablet, Take 1 tablet (25 mg total) by mouth 2 (two) times daily., Disp: 60 tablet, Rfl: 4 .  pravastatin (PRAVACHOL) 40 MG tablet, TAKE (1) TABLET BY MOUTH ONCE DAILY., Disp: 30 tablet, Rfl: 0     Review of Systems     Objective:   Physical Exam  Constitutional: He is oriented to person, place, and time. He appears well-developed and well-nourished. No distress.  HENT:  Head: Normocephalic and atraumatic.  Right Ear: External ear normal.  Left Ear: External ear normal.  Mouth/Throat: Oropharynx is clear and moist. No oropharyngeal exudate.  Eyes: Conjunctivae and EOM are normal. Pupils are equal, round, and reactive to light. Right eye exhibits no discharge. Left eye exhibits no discharge. No scleral icterus.  Neck: Normal range of motion. Neck supple. No JVD present. No tracheal deviation present. No thyromegaly present.  Cardiovascular: Normal rate, regular rhythm and intact distal pulses.  Exam reveals no gallop and no friction rub.   No murmur heard. Pulmonary/Chest: Effort normal and breath sounds normal. No respiratory distress. He has no wheezes. He has no rales. He exhibits no tenderness.  Abdominal: Soft. Bowel sounds are normal. He exhibits no distension and no mass. There is no tenderness. There is no rebound and no guarding.  Musculoskeletal: Normal range of motion. He exhibits no edema or tenderness.  Lymphadenopathy:    He has no cervical adenopathy.  Neurological: He is alert and oriented to person, place, and time. He has normal reflexes. No cranial nerve deficit. Coordination normal.  Skin: Skin is warm and dry. No rash noted. He is not diaphoretic. No erythema. No pallor.  Psychiatric: He has a normal mood and affect. His behavior is normal. Judgment and thought content normal.  Nursing note  and vitals reviewed.   Filed Vitals:   06/01/15 1336  BP: 144/88  Pulse: 104  Height: 6' (1.829 m)  Weight: 220 lb 6.4 oz (99.973 kg)  SpO2: 97%  ;ls     Assessment:       ICD-9-CM ICD-10-CM   1. History of pulmonary embolism V12.55 Z86.711   2. Pulmonary emphysema, unspecified emphysema type ([place]) 492.8 J43.9   3. Smoking history V15.82 Z72.0   4. Alpha-1-antitrypsin deficiency ([place]) 273.4 E88.01        Plan:     #  blood clot  = one year since blood thinner completion  - no evidence of blood clot recurrence - risk is low but not zero  - continue daily aspirin therapy - quit smoking  #smoking  -= Smoking puts you at risk  for lung cancer, blood clots, emphysema getting worse - Therefore please stop smoking as well as possible  # alpha 1 genetic deficiency  - The cells me that you've inherited genetics that could damage  lungs - quit smoking as soon as possible - We need to monitor your lung function closely every year - Do full pulmonary function tests in 6 months - make sure all your blood relatives get tested for alpha 1 disease  # mild emphysema with shortness of breath - Start Spiriva respimat daily  #Follow-up Full pulmonary function tests in 6 months 6 months or sooner to report progress    Dr. Brand Males, M.D., [facility].C.P Pulmonary and Critical Care Medicine Staff Physician [facility] Pager: [phone], If no answer or between  15:00h - 7:00h: call [phone]  06/01/2015 1:55 PM

## 2015-06-01 NOTE — Patient Instructions (Addendum)
ICD-9-CM ICD-10-CM   1. History of pulmonary embolism V12.55 Z86.711   2. Pulmonary emphysema, unspecified emphysema type ([place]) 492.8 J43.9   3. Smoking history V15.82 Z72.0   4. Alpha-1-antitrypsin deficiency ([place]) 273.4 E88.01    #blood clot  = one year since blood thinner completion  - no evidence of blood clot recurrence - risk is low but not zero  - continue daily aspirin therapy - quit smoking  #smoking  -= Smoking puts you at risk  for lung cancer, blood clots, emphysema getting worse - Therefore please stop smoking as well as possible  # alpha 1 genetic deficiency  -  you've inherited genetics that could damage  lungs - quit smoking as soon as possible - We need to monitor your lung function closely every year - Do full pulmonary function tests in 6 months - make sure all your blood relatives get tested for alpha 1 disease  # mild emphysema with shortness of breath - Start Spiriva respimat daily  #Follow-up Full pulmonary function tests in 6 months 6 months or sooner to report progress

## 2015-06-24 ENCOUNTER — Encounter (HOSPITAL_COMMUNITY): Payer: Self-pay | Admitting: Psychiatry

## 2015-06-24 ENCOUNTER — Ambulatory Visit (INDEPENDENT_AMBULATORY_CARE_PROVIDER_SITE_OTHER): Payer: Medicare Other | Admitting: Psychiatry

## 2015-06-24 VITALS — BP 128/83 | HR 72 | Ht 72.0 in | Wt 222.2 lb

## 2015-06-24 DIAGNOSIS — F258 Other schizoaffective disorders: Secondary | ICD-10-CM

## 2015-06-24 MED ORDER — LURASIDONE HCL 40 MG PO TABS: 40.0000 mg | ORAL_TABLET | Freq: Every day | ORAL | Status: DC

## 2015-06-24 MED ORDER — CLONAZEPAM 1 MG PO TABS: 1.0000 mg | ORAL_TABLET | Freq: Two times a day (BID) | ORAL | Status: DC

## 2015-06-24 MED ORDER — SERTRALINE HCL 50 MG PO TABS: 50.0000 mg | ORAL_TABLET | Freq: Every day | ORAL | Status: DC
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
## 2015-07-02 ENCOUNTER — Other Ambulatory Visit: Payer: Self-pay | Admitting: Family Medicine

## 2015-07-15 ENCOUNTER — Ambulatory Visit (INDEPENDENT_AMBULATORY_CARE_PROVIDER_SITE_OTHER): Payer: Medicare Other | Admitting: Psychology

## 2015-07-15 ENCOUNTER — Telehealth (HOSPITAL_COMMUNITY): Payer: Self-pay | Admitting: *Deleted

## 2015-07-15 DIAGNOSIS — F250 Schizoaffective disorder, bipolar type: Secondary | ICD-10-CM

## 2015-07-15 NOTE — Telephone Encounter (Signed)
Yes he can take it at bedtime

## 2015-07-15 NOTE — Telephone Encounter (Signed)
Pt was in office seeing another provider today. Before leaving pt stated that when he takes his Latuda at dinner, about an hour later, he falls a sleep no matter what he's trying to do. Per pt, he would like to know if it would be ok for his to take it later at night with something to eat? Per pt, if he takes it at dinner, he ends up waking back up at 1am and finds it hard to fall back a sleep. Pt number is [phone].

## 2015-07-15 NOTE — Progress Notes (Signed)
Patient:  Ian Grimes   DOB: [birth year]  MR Number: [id number]  Location: [facility] [address] [place] [address] Dept: [phone]  Start: 3 PM End: 4 PM  Provider/Observer:     Edgardo Roys PSYD  Chief Complaint:      Chief Complaint  Patient presents with  . Anxiety  . Agitation  . Trauma  . Stress    Reason For Service:     The patient was referred by Dr. Harrington Challenger for psychotherapeutic interventions due to ongoing issues with schizoaffective disorder.  The patient had a very try and difficult childhood with what is reported as a lack of parental help her control. His mother abandoned the family when he was young and his father at all kinds of substance abuse issues and legal issues. The patient was shuttled between various family members throughout his life. The patient has had multiple hospitalizations for psychiatric issues to the years and has been diagnosed with schizoaffective disorder. He is very isolated and has difficulty coping with day-to-day stressors.  Interventions Strategy:  Cognitive/behavioral psychotherapeutic interventions  Participation Level:   Active  Participation Quality:  Appropriate      Behavioral Observation:  Well Groomed, Alert, and Appropriate.   Current Psychosocial Factors: The patient reports that he is generally isolated and does not spend much time around others.  Content of Session:   Reviewed current symptoms and whenever historical issues and worked on Adult nurse. We worked on cognitive/behavioral psychotherapeutic interventions.  Current Status:   The patient reports that his psychotic symptoms and his agitation have been fairly stable and he is responding well to his medications for the most part. He does report that latuda seems to be making very sick sleepy  Patient Progress:   Stable  Target Goals:   Target goals include  building better coping skills and strategies Reducing intensity, frequency, and duration of his psychotic and mood based symptoms.  Last Reviewed:   07/15/2015  Goals Addressed Today:    Goals addressed today had to do with building better resources and skills.  Impression/Diagnosis:   The patient is a long history of diagnosis of schizoaffective disorder. He has had multiple hospitalizations and has been treated with both mood stabilizing agents as well as antipsychotic agents. The patient had a very traumatic and difficult childhood having a very dysfunctional parents. He's had difficulty coping with life essentially from his childhood on.  Diagnosis:    Axis I: Schizoaffective disorder, bipolar type ([place])    RODENBOUGH,JOHN R, PsyD 07/15/2015

## 2015-07-16 NOTE — Telephone Encounter (Signed)
Called pt and informed him of what provider stated. Pt showed understanding.

## 2015-07-28 ENCOUNTER — Encounter (HOSPITAL_COMMUNITY): Payer: Self-pay | Admitting: Psychiatry

## 2015-07-28 ENCOUNTER — Ambulatory Visit (INDEPENDENT_AMBULATORY_CARE_PROVIDER_SITE_OTHER): Payer: Medicare Other | Admitting: Psychiatry

## 2015-07-28 VITALS — BP 114/86 | HR 75 | Ht 72.0 in | Wt 226.0 lb

## 2015-07-28 DIAGNOSIS — F250 Schizoaffective disorder, bipolar type: Secondary | ICD-10-CM | POA: Diagnosis not present

## 2015-07-28 MED ORDER — SERTRALINE HCL 50 MG PO TABS: 50.0000 mg | ORAL_TABLET | Freq: Every day | ORAL | Status: DC

## 2015-07-28 MED ORDER — LURASIDONE HCL 40 MG PO TABS: 40.0000 mg | ORAL_TABLET | Freq: Every day | ORAL | Status: DC

## 2015-07-28 MED ORDER — CLONAZEPAM 1 MG PO TABS: 1.0000 mg | ORAL_TABLET | Freq: Three times a day (TID) | ORAL | Status: DC
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
## 2015-08-05 ENCOUNTER — Other Ambulatory Visit: Payer: Self-pay | Admitting: Family Medicine

## 2015-08-13 ENCOUNTER — Ambulatory Visit (INDEPENDENT_AMBULATORY_CARE_PROVIDER_SITE_OTHER): Payer: Medicare Other | Admitting: Psychology

## 2015-08-13 DIAGNOSIS — F250 Schizoaffective disorder, bipolar type: Secondary | ICD-10-CM | POA: Diagnosis not present

## 2015-08-24 ENCOUNTER — Telehealth (HOSPITAL_COMMUNITY): Payer: Self-pay | Admitting: *Deleted

## 2015-08-24 NOTE — Telephone Encounter (Signed)
phone call from patient, said he made mistake and taken all of his KLONOPIN within two weeks.

## 2015-08-24 NOTE — Telephone Encounter (Signed)
Called pt due to previous phone call. lmtcb and office number provided.

## 2015-08-24 NOTE — Telephone Encounter (Signed)
Pt called office back. Per pt, in two weeks he took all of his Klonopin. Per pt, he was taking 5-6 tablets a day and he ran out Saturday or Sunday. Pt medication was printed and given to him May 30th with 2 refills. Informed pt he have refills on his script and may have to wait until his next scheduled due date to get his Klonopin filled but message will be sent to provider. Also informed pt that insurance will not pay for any mediations that is not part of his regular scheduled pickup and my have to pay out of pocket if Dr. Harrington Challenger does agree to give him enough tablets to last him until his next refill date. Pt verbalized understanding. Pt number is [phone]

## 2015-08-25 ENCOUNTER — Encounter: Payer: Self-pay | Admitting: Family Medicine

## 2015-08-25 ENCOUNTER — Ambulatory Visit (INDEPENDENT_AMBULATORY_CARE_PROVIDER_SITE_OTHER): Payer: Medicare Other | Admitting: Family Medicine

## 2015-08-25 VITALS — BP 118/78 | HR 70 | Temp 95.8°F | Ht 73.83 in | Wt 231.2 lb

## 2015-08-25 DIAGNOSIS — Z720 Tobacco use: Secondary | ICD-10-CM

## 2015-08-25 DIAGNOSIS — R51 Headache: Secondary | ICD-10-CM

## 2015-08-25 DIAGNOSIS — R0789 Other chest pain: Secondary | ICD-10-CM

## 2015-08-25 DIAGNOSIS — R519 Headache, unspecified: Secondary | ICD-10-CM

## 2015-08-25 NOTE — Progress Notes (Signed)
   HPI  Patient presents today here for follow-up.  Headaches headaches have been going on for years, they come on approximately 6 out of 7 days a week, they last several hours a day. Described as squeezing constant type pain that is moderate to severe in nature. There are no alleviating factors except for a pain medication that he tried previously. This was a controlled substance and he does not remember the name. He states he does get GI upset with it occasionally. Radiates from his right or left maxillary sinus area and goes down to his right or left neck on the ipsilateral side. He states that putting pressure in the corner of his eye next to his nose relieves the pressure. He requests pain medication for this today he also requests MRI.   Chest pain Right sided chest pain happening at rest, never happening with exertion Nonradiating Lasting for less than a minute No aggravating or alleviating factors. No associated palpitations, sweats, weakness, or shortness of breath.  Smoking Not really ready to quit, considering  After more in-depth discussion he denies any suicidal thoughts today. He states that in the past he has had suicidal thoughts, however for quite some time he's not had any problems with this.   PMH: Smoking status noted ROS: Per HPI  Objective: BP 118/78 mmHg  Pulse 70  Temp(Src) 95.8 F (35.4 C) (Oral)  Ht 6' 1.83" (1.875 m)  Wt 231 lb 3.2 oz (104.872 kg)  BMI 29.83 kg/m2 Gen: NAD, alert, cooperative with exam HEENT: NCAT, tenderness to palpation of right maxillary sinus and left maxillary sinus as well as right frontal sinus CV: RRR, good S1/S2, no murmur Resp: CTABL, no wheezes, non-labored Ext: No edema, warm Neuro: Alert and oriented, strength 5/5 and sensation intact in bilateral upper and lower extremities, cranial nerves II through XII intact, EOMI, PERRLA  Assessment and plan:  # Headaches Considering the relieving the headache with pressure  over the maxillary sinusitis suspect sinus related headache Scheduled Zyrtec 1 month Follow-up one month Previously considered to be atypical migraine, will refer to neurology if he has persistent symptoms. Neuro exam is reassuring today.  # Atypical chest pain Very atypical presentation, nonexertional EKG without any ischemic findings Provided reassurance and advised watchful waiting Consider anxiety as likely source  # Tobacco abuse Recommended cessation, she will consider    Orders Placed This Encounter  Procedures  . Lipid Panel  . CMP14+EGFR  . CBC with Differential  . EKG 12-Lead    No orders of the defined types were placed in this encounter.    Laroy Apple, MD [facility] 08/25/2015, 3:28 PM

## 2015-08-25 NOTE — Telephone Encounter (Signed)
No early refills on controlled meds

## 2015-08-25 NOTE — Telephone Encounter (Signed)
Called pt and informed him of what Dr. Harrington Challenger stated about his medication and previous call and pt verbalized understanding.

## 2015-08-25 NOTE — Patient Instructions (Signed)
Great to see you!  Try a plain over the counter zyrtec or allegra to see if it helps your headaches, come back in 1 month  (do not take zyrtec D or allegra D)  We will call with labs within 1 week

## 2015-08-26 ENCOUNTER — Other Ambulatory Visit: Payer: Self-pay | Admitting: *Deleted

## 2015-08-26 DIAGNOSIS — E782 Mixed hyperlipidemia: Secondary | ICD-10-CM

## 2015-08-26 LAB — CMP14+EGFR
A/G RATIO: 2.3 — AB (ref 1.2–2.2)
ALT: 20 IU/L (ref 0–44)
AST: 20 IU/L (ref 0–40)
Albumin: 4.3 g/dL (ref 3.5–5.5)
Alkaline Phosphatase: 60 IU/L (ref 39–117)
BUN/Creatinine Ratio: 9 (ref 9–20)
BUN: 9 mg/dL (ref 6–24)
Bilirubin Total: <0.2 mg/dL (ref 0.0–1.2)
CALCIUM: 9.2 mg/dL (ref 8.7–10.2)
CO2: 23 mmol/L (ref 18–29)
CREATININE: 1.03 mg/dL (ref 0.76–1.27)
Chloride: 105 mmol/L (ref 96–106)
GFR, EST AFRICAN AMERICAN: 101 mL/min/{1.73_m2} (ref >59)
GFR, EST NON AFRICAN AMERICAN: 87 mL/min/{1.73_m2} (ref >59)
GLOBULIN, TOTAL: 1.9 g/dL (ref 1.5–4.5)
Glucose: 87 mg/dL (ref 65–99)
POTASSIUM: 4.2 mmol/L (ref 3.5–5.2)
Sodium: 144 mmol/L (ref 134–144)
TOTAL PROTEIN: 6.2 g/dL (ref 6.0–8.5)

## 2015-08-26 LAB — CBC WITH DIFFERENTIAL/PLATELET
BASOS: 1 %
Basophils Absolute: 0.1 10*3/uL (ref 0.0–0.2)
EOS (ABSOLUTE): 0.5 10*3/uL — AB (ref 0.0–0.4)
EOS: 5 %
HEMATOCRIT: 46.7 % (ref 37.5–51.0)
HEMOGLOBIN: 15.7 g/dL (ref 12.6–17.7)
IMMATURE GRANS (ABS): 0.1 10*3/uL (ref 0.0–0.1)
IMMATURE GRANULOCYTES: 1 %
Lymphocytes Absolute: 3.6 10*3/uL — ABNORMAL HIGH (ref 0.7–3.1)
Lymphs: 34 %
MCH: 32.4 pg (ref 26.6–33.0)
MCHC: 33.6 g/dL (ref 31.5–35.7)
MCV: 96 fL (ref 79–97)
Monocytes Absolute: 0.7 10*3/uL (ref 0.1–0.9)
Monocytes: 7 %
NEUTROS ABS: 5.7 10*3/uL (ref 1.4–7.0)
NEUTROS PCT: 52 %
Platelets: 288 10*3/uL (ref 150–379)
RBC: 4.85 x10E6/uL (ref 4.14–5.80)
RDW: 14.1 % (ref 12.3–15.4)
WBC: 10.6 10*3/uL (ref 3.4–10.8)

## 2015-08-26 LAB — LIPID PANEL
CHOL/HDL RATIO: 10.2 ratio — AB (ref 0.0–5.0)
CHOLESTEROL TOTAL: 194 mg/dL (ref 100–199)
HDL: 19 mg/dL — ABNORMAL LOW (ref >39)
LDL Calculated: 102 mg/dL — ABNORMAL HIGH (ref 0–99)
TRIGLYCERIDES: 365 mg/dL — AB (ref 0–149)
VLDL Cholesterol Cal: 73 mg/dL — ABNORMAL HIGH (ref 5–40)

## 2015-08-27 ENCOUNTER — Telehealth (HOSPITAL_COMMUNITY): Payer: Self-pay | Admitting: *Deleted

## 2015-08-31 NOTE — Telephone Encounter (Signed)
ERROR

## 2015-09-03 ENCOUNTER — Ambulatory Visit (HOSPITAL_COMMUNITY): Payer: Self-pay | Admitting: Psychiatry

## 2015-09-03 ENCOUNTER — Encounter (HOSPITAL_COMMUNITY): Payer: Self-pay | Admitting: Psychiatry

## 2015-09-03 ENCOUNTER — Ambulatory Visit (INDEPENDENT_AMBULATORY_CARE_PROVIDER_SITE_OTHER): Payer: Medicare Other | Admitting: Psychiatry

## 2015-09-03 VITALS — BP 150/85 | HR 61 | Ht 72.0 in | Wt 224.0 lb

## 2015-09-03 DIAGNOSIS — F250 Schizoaffective disorder, bipolar type: Secondary | ICD-10-CM | POA: Diagnosis not present

## 2015-09-03 MED ORDER — SERTRALINE HCL 50 MG PO TABS: 50.0000 mg | ORAL_TABLET | Freq: Every day | ORAL | Status: DC

## 2015-09-03 MED ORDER — LURASIDONE HCL 40 MG PO TABS: 40.0000 mg | ORAL_TABLET | Freq: Every day | ORAL | Status: DC

## 2015-09-03 MED ORDER — CLONAZEPAM 1 MG PO TABS: 1.0000 mg | ORAL_TABLET | Freq: Two times a day (BID) | ORAL | Status: DC
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
## 2015-09-07 ENCOUNTER — Other Ambulatory Visit: Payer: Self-pay | Admitting: Family Medicine

## 2015-09-21 ENCOUNTER — Ambulatory Visit (INDEPENDENT_AMBULATORY_CARE_PROVIDER_SITE_OTHER): Payer: Medicare Other | Admitting: Psychology

## 2015-09-21 DIAGNOSIS — F250 Schizoaffective disorder, bipolar type: Secondary | ICD-10-CM

## 2015-10-05 ENCOUNTER — Other Ambulatory Visit: Payer: Self-pay | Admitting: Internal Medicine

## 2015-10-05 DIAGNOSIS — E8801 Alpha-1-antitrypsin deficiency: Secondary | ICD-10-CM

## 2015-10-14 ENCOUNTER — Telehealth (HOSPITAL_COMMUNITY): Payer: Self-pay | Admitting: *Deleted

## 2015-10-14 NOTE — Telephone Encounter (Signed)
LEFT VOICE REGARDING APPOINTMENT.

## 2015-10-14 NOTE — Telephone Encounter (Signed)
left voice message regarding provider out of office on 10/22/15.

## 2015-10-15 ENCOUNTER — Ambulatory Visit (HOSPITAL_COMMUNITY): Payer: Self-pay | Admitting: Psychiatry

## 2015-10-17 DIAGNOSIS — Z951 Presence of aortocoronary bypass graft: Secondary | ICD-10-CM | POA: Diagnosis not present

## 2015-10-17 DIAGNOSIS — F329 Major depressive disorder, single episode, unspecified: Secondary | ICD-10-CM | POA: Diagnosis not present

## 2015-10-17 DIAGNOSIS — Z915 Personal history of self-harm: Secondary | ICD-10-CM | POA: Diagnosis not present

## 2015-10-17 DIAGNOSIS — Z7982 Long term (current) use of aspirin: Secondary | ICD-10-CM | POA: Diagnosis not present

## 2015-10-17 DIAGNOSIS — F29 Unspecified psychosis not due to a substance or known physiological condition: Secondary | ICD-10-CM | POA: Diagnosis not present

## 2015-10-17 DIAGNOSIS — F17200 Nicotine dependence, unspecified, uncomplicated: Secondary | ICD-10-CM | POA: Diagnosis not present

## 2015-10-17 DIAGNOSIS — F419 Anxiety disorder, unspecified: Secondary | ICD-10-CM | POA: Diagnosis not present

## 2015-10-17 DIAGNOSIS — Z79899 Other long term (current) drug therapy: Secondary | ICD-10-CM | POA: Diagnosis not present

## 2015-10-17 DIAGNOSIS — R45851 Suicidal ideations: Secondary | ICD-10-CM | POA: Diagnosis not present

## 2015-10-19 ENCOUNTER — Encounter: Payer: Self-pay | Admitting: Family Medicine

## 2015-10-19 ENCOUNTER — Ambulatory Visit (INDEPENDENT_AMBULATORY_CARE_PROVIDER_SITE_OTHER): Payer: Medicare Other | Admitting: Family Medicine

## 2015-10-19 VITALS — BP 102/72 | HR 67 | Temp 98.2°F | Ht 72.0 in | Wt 229.8 lb

## 2015-10-19 DIAGNOSIS — E8801 Alpha-1-antitrypsin deficiency: Secondary | ICD-10-CM | POA: Diagnosis not present

## 2015-10-19 DIAGNOSIS — F258 Other schizoaffective disorders: Secondary | ICD-10-CM | POA: Diagnosis not present

## 2015-10-19 DIAGNOSIS — M2550 Pain in unspecified joint: Secondary | ICD-10-CM | POA: Diagnosis not present

## 2015-10-19 DIAGNOSIS — Z720 Tobacco use: Secondary | ICD-10-CM

## 2015-10-19 MED ORDER — DICLOFENAC SODIUM 75 MG PO TBEC: 75.0000 mg | DELAYED_RELEASE_TABLET | Freq: Two times a day (BID) | ORAL | 2 refills | Status: DC

## 2015-10-19 MED ORDER — GABAPENTIN 300 MG PO CAPS: ORAL_CAPSULE | ORAL | 0 refills | Status: DC

## 2015-10-19 NOTE — Addendum Note (Signed)
Addended by: Claretta Fraise on: 10/19/2015 02:51 PM   Modules accepted: Orders

## 2015-10-19 NOTE — Progress Notes (Addendum)
Subjective:  Patient ID: Ian Grimes, male    DOB: 11-24-1970  Age: 45 y.o. MRN: [id number]  CC: HH referral   HPI Ian Grimes presents for Follow-up of his schizoaffective disorder. He's been seeing Dr. Harrington Grimes for that recently. He is on multiple medications and needs some supervision with those. He is also having some trouble with his activities of daily living due to his psychiatric disorder. He does not eat regular meals. He cannot take care of his home. He is frustrated by his own inability and wonders if there is anything to help him available through social services etc.  He is having some body aches all over. He is not sure what's causing that. He is continuing to take his pravastatin for his cholesterol though he is also taking ezetimibe and fenofibrate.   History Ian Grimes has a past medical history of Anxiety; Depression; HA (headache); echocardiogram; Hyperlipidemia; Pleural effusion; Pulmonary emboli ([place]); Pulmonary nodule; and Schizo-affective psychosis ([place]).   He has a past surgical history that includes rib injury; Cardiac catheterization; Intraoprative transesophageal echocardiogram (N/A, 11/14/2013); Ascending aortic root replacement (N/A, 11/14/2013); and left and right heart catheterization with coronary angiogram (N/A, 11/01/2013).   His family history includes Alcohol abuse in his father; Asthma in his paternal aunt; COPD in his father and paternal aunt; Cancer in his father; Diabetes in his paternal grandfather; Heart attack in his father; Heart disease in his father; Stroke in his father and paternal uncle.He reports that he has been smoking Cigarettes.  He has a 22.00 pack-year smoking history. He has never used smokeless tobacco. He reports that he does not drink alcohol or use drugs.    Medication List       Accurate as of 10/19/15  2:50 PM. Always use your most recent med list.          aspirin 81 MG tablet Take 81 mg by mouth daily.   clonazePAM 1 MG  tablet Commonly known as:  KLONOPIN Take 1 tablet (1 mg total) by mouth 2 (two) times daily.   diclofenac 75 MG EC tablet Commonly known as:  VOLTAREN Take 1 tablet (75 mg total) by mouth 2 (two) times daily. For muscle and  Joint pain   ezetimibe 10 MG tablet Commonly known as:  ZETIA Take 1 tablet (10 mg total) by mouth daily.   fenofibrate micronized 130 MG capsule Commonly known as:  ANTARA TAKE 1 CAPSULE ONCE DAILY AT BREAKFAST.   gabapentin 300 MG capsule Commonly known as:  NEURONTIN 1 at bedtime 1 week then 2  1 week then 3 1 week then 4 daily. For Nerve pain   ibuprofen 200 MG tablet Commonly known as:  ADVIL,MOTRIN Take by mouth daily.   lurasidone 40 MG Tabs tablet Commonly known as:  LATUDA Take 1 tablet (40 mg total) by mouth daily with supper.   metoprolol tartrate 25 MG tablet Commonly known as:  LOPRESSOR TAKE (1) TABLET TWICE DAILY.   pravastatin 40 MG tablet Commonly known as:  PRAVACHOL TAKE (1) TABLET BY MOUTH ONCE DAILY.   sertraline 50 MG tablet Commonly known as:  ZOLOFT Take 1 tablet (50 mg total) by mouth daily.        ROS Review of Systems  Constitutional: Negative for chills, diaphoresis, fever and unexpected weight change.  HENT: Negative for congestion, hearing loss, rhinorrhea and sore throat.   Eyes: Negative for visual disturbance.  Respiratory: Positive for shortness of breath. Negative for cough.   Cardiovascular: Negative  for chest pain.  Gastrointestinal: Negative for abdominal pain, constipation and diarrhea.  Genitourinary: Negative for dysuria and flank pain.  Musculoskeletal: Positive for arthralgias and myalgias (involving shoulders, back and radiating down both legs. Feels sharp intermittently.). Negative for joint swelling.  Skin: Negative for rash.  Neurological: Negative for dizziness and headaches.  Psychiatric/Behavioral: Negative for dysphoric mood and sleep disturbance.    Objective:  BP 102/72 (BP  Location: Left Arm, Patient Position: Sitting, Cuff Size: Large)   Pulse 67   Temp 98.2 F (36.8 C) (Oral)   Ht 6' (1.829 m)   Wt 229 lb 12.8 oz (104.2 kg)   SpO2 96%   BMI 31.17 kg/m   BP Readings from Last 3 Encounters:  10/19/15 102/72  09/03/15 (!) 150/85  08/25/15 118/78    Wt Readings from Last 3 Encounters:  10/19/15 229 lb 12.8 oz (104.2 kg)  09/03/15 224 lb (101.6 kg)  08/25/15 231 lb 3.2 oz (104.9 kg)     Physical Exam  Constitutional: He is oriented to person, place, and time. He appears well-developed and well-nourished. No distress.  HENT:  Head: Normocephalic and atraumatic.  Right Ear: External ear normal.  Left Ear: External ear normal.  Nose: Nose normal.  Mouth/Throat: Oropharynx is clear and moist.  Eyes: Conjunctivae and EOM are normal. Pupils are equal, round, and reactive to light.  Neck: Normal range of motion. Neck supple. No thyromegaly present.  Cardiovascular: Normal rate, regular rhythm and normal heart sounds.   No murmur heard. Pulmonary/Chest: Effort normal and breath sounds normal. No respiratory distress. He has no wheezes. He has no rales.  Abdominal: Soft. Bowel sounds are normal. He exhibits no distension. There is no tenderness.  Lymphadenopathy:    He has no cervical adenopathy.  Neurological: He is alert and oriented to person, place, and time. He has normal reflexes.  Skin: Skin is warm and dry.  Psychiatric: He has a normal mood and affect. His behavior is normal. Judgment and thought content normal.     Lab Results  Component Value Date   WBC 10.6 08/25/2015   HGB 10.4 (L) 11/18/2013   HCT 46.7 08/25/2015   PLT 288 08/25/2015   GLUCOSE 87 08/25/2015   CHOL 194 08/25/2015   TRIG 365 (H) 08/25/2015   HDL 19 (L) 08/25/2015   LDLCALC 102 (H) 08/25/2015   ALT 20 08/25/2015   AST 20 08/25/2015   NA 144 08/25/2015   K 4.2 08/25/2015   CL 105 08/25/2015   CREATININE 1.03 08/25/2015   BUN 9 08/25/2015   CO2 23 08/25/2015     TSH 1.302 05/07/2013   PSA 0.5 04/30/2013   INR 0.99 11/12/2013   HGBA1C 5.6 11/12/2013    No results found.  Assessment & Plan:   Ian Grimes was seen today for hh referral.  Diagnoses and all orders for this visit:  Other schizoaffective disorders ([place]) -     Ambulatory referral to [facility]  Alpha-1-antitrypsin deficiency ([place])  Tobacco use  Arthralgia  Other orders -     diclofenac (VOLTAREN) 75 MG EC tablet; Take 1 tablet (75 mg total) by mouth 2 (two) times daily. For muscle and  Joint pain -     gabapentin (NEURONTIN) 300 MG capsule; 1 at bedtime 1 week then 2  1 week then 3 1 week then 4 daily. For Nerve pain    I am having Ian Grimes start on diclofenac and gabapentin. I am also having him maintain his aspirin, fenofibrate  micronized, pravastatin, ezetimibe, ibuprofen, lurasidone, sertraline, clonazePAM, and metoprolol tartrate.  Meds ordered this encounter  Medications  . diclofenac (VOLTAREN) 75 MG EC tablet    Sig: Take 1 tablet (75 mg total) by mouth 2 (two) times daily. For muscle and  Joint pain    Dispense:  60 tablet    Refill:  2  . gabapentin (NEURONTIN) 300 MG capsule    Sig: 1 at bedtime 1 week then 2  1 week then 3 1 week then 4 daily. For Nerve pain    Dispense:  120 capsule    Refill:  0   Discussed smoking and COPD espceially with Alpha 1 AT def.Pt. Aware. No plan to quit. Encouragement given  Follow-up: Return in about 6 weeks (around 11/30/2015).  Claretta Fraise, M.D.

## 2015-10-22 ENCOUNTER — Ambulatory Visit (HOSPITAL_COMMUNITY): Payer: Self-pay | Admitting: Psychology

## 2015-10-22 IMAGING — CT CT CHEST W/ CM
2 of 3 series · 15 of 36 positions shown, 18 images · IV contrast (omnipaque)
Comparison: Chest radiographs dated 04/30/2013

CLINICAL DATA: Chest pain, cough, right lower lobe nodules on chest
radiograph

EXAM:
CT CHEST WITH CONTRAST
TECHNIQUE: Multidetector CT imaging of the chest was performed during
intravenous contrast administration.
CONTRAST:  80mL OMNIPAQUE IOHEXOL 300 MG/ML  SOLN

[Series 2: chestroutine 5.0 b40f · axial · 0.73mm/px · z∈[+1129,+1379]mm · 12 of 60 slices shown, 15 images]
[im 5/60  mediastinal]
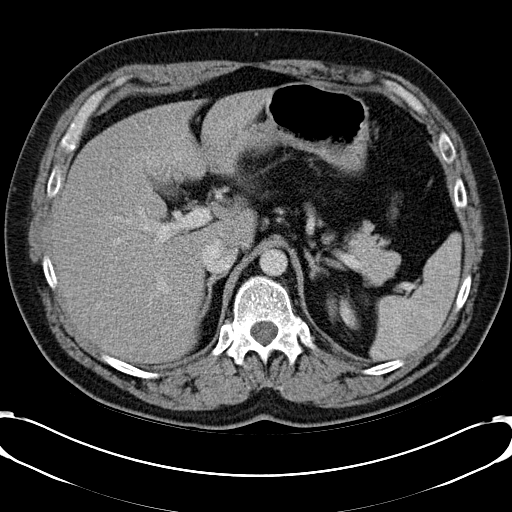
[im 5/60  lung]
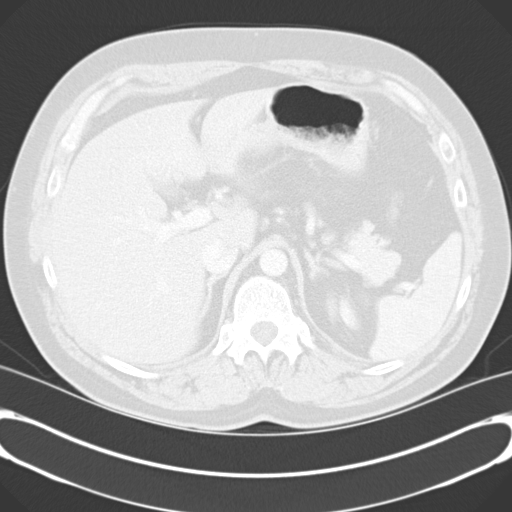
[im 9/60  lung]
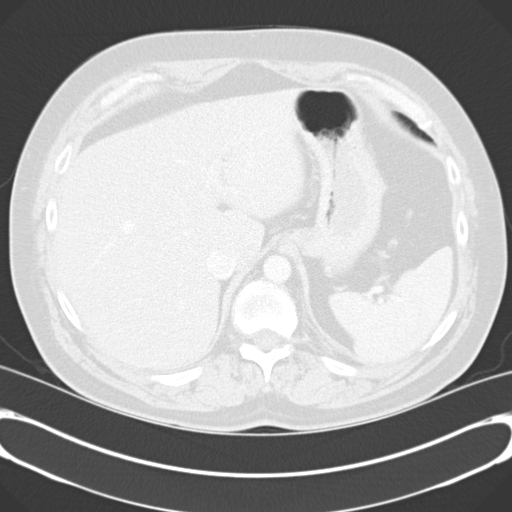
[im 14/60  lung]
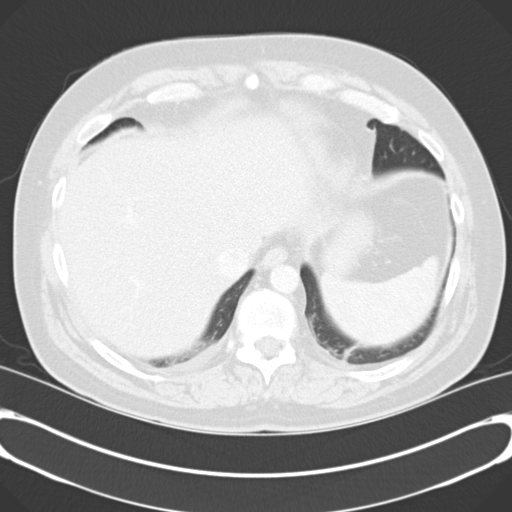
[im 18/60  lung]
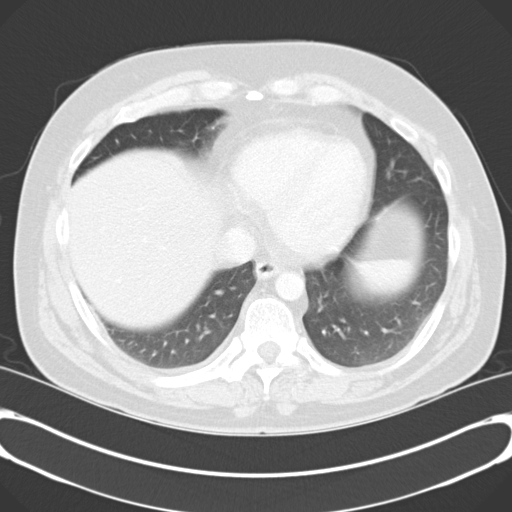
[im 22/60  mediastinal]
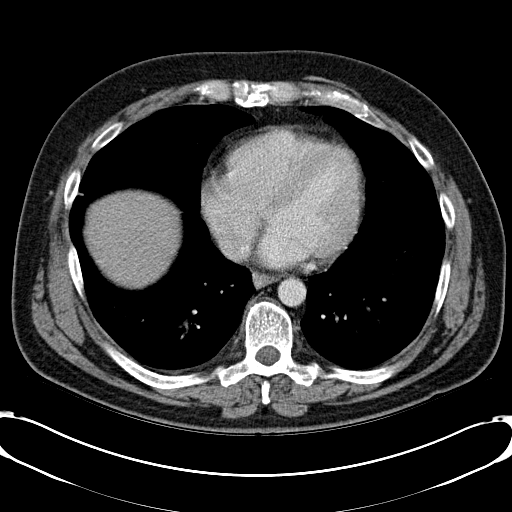
[im 22/60  lung]
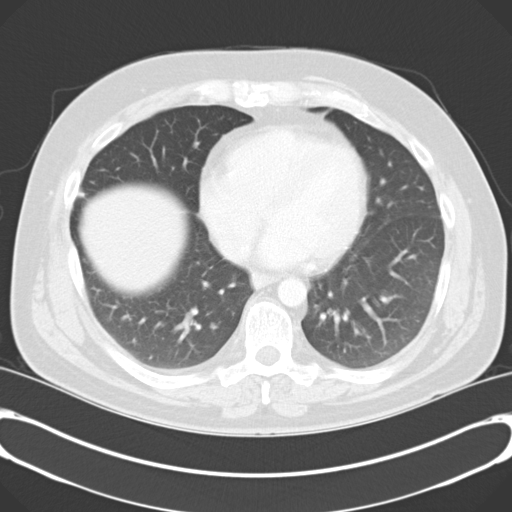
[im 27/60  lung]
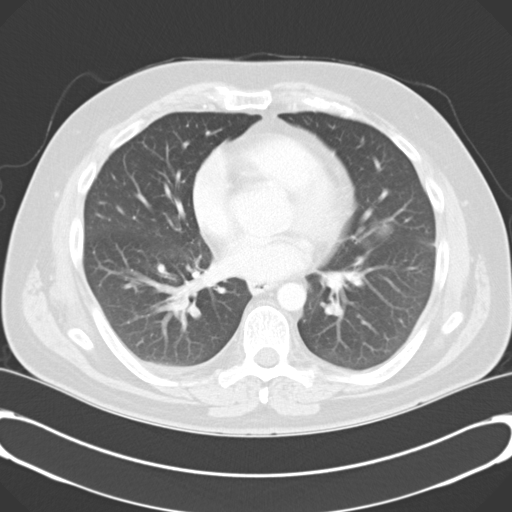
[im 33/60  lung]
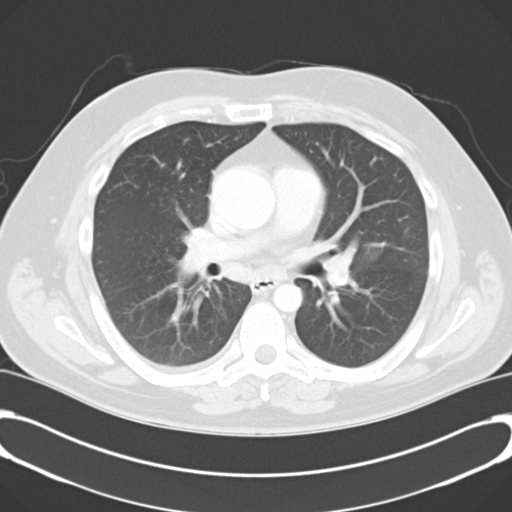
[im 38/60  lung]
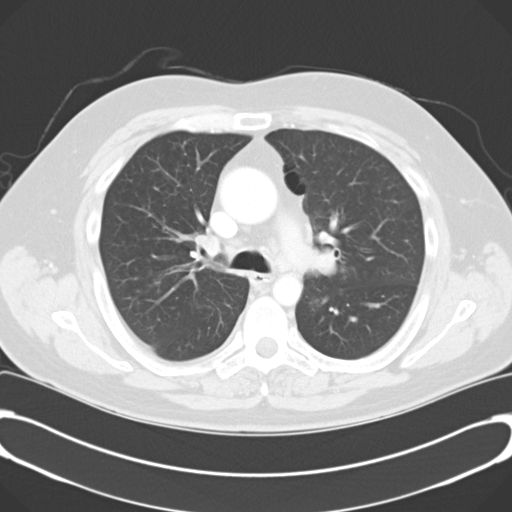
[im 42/60  mediastinal]
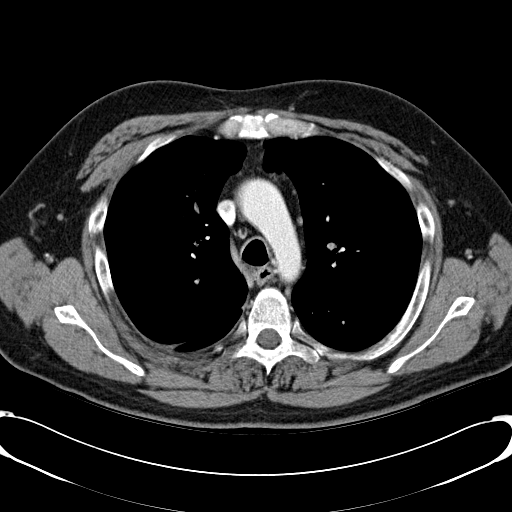
[im 42/60  lung]
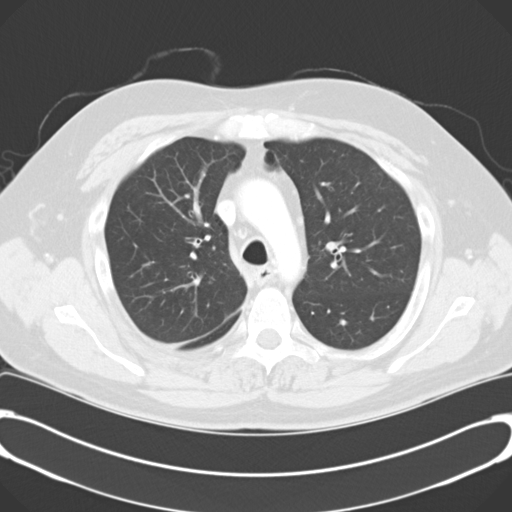
[im 46/60  lung]
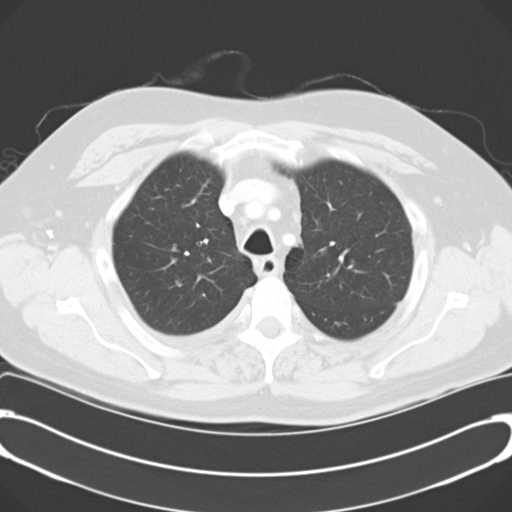
[im 51/60  lung]
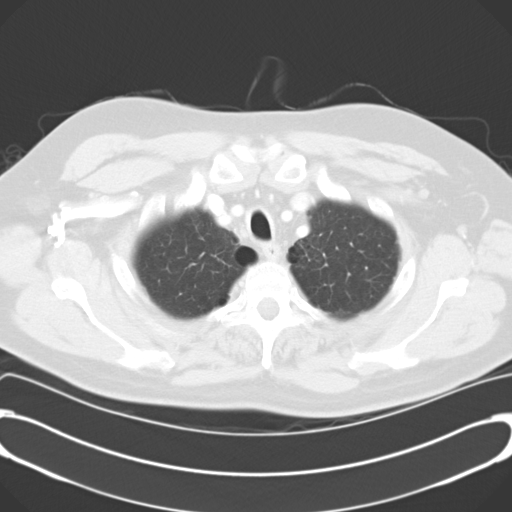
[im 55/60  lung]
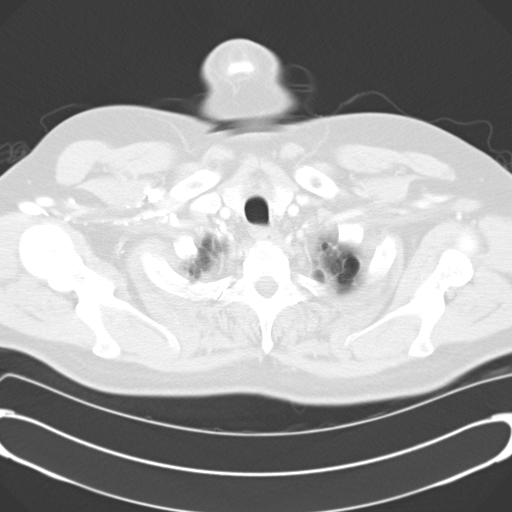

[Series 4: mpr coronal chest 3mm · coronal · 0.58mm/px · 3 of 89 slices shown]
[im 18/89  lung]
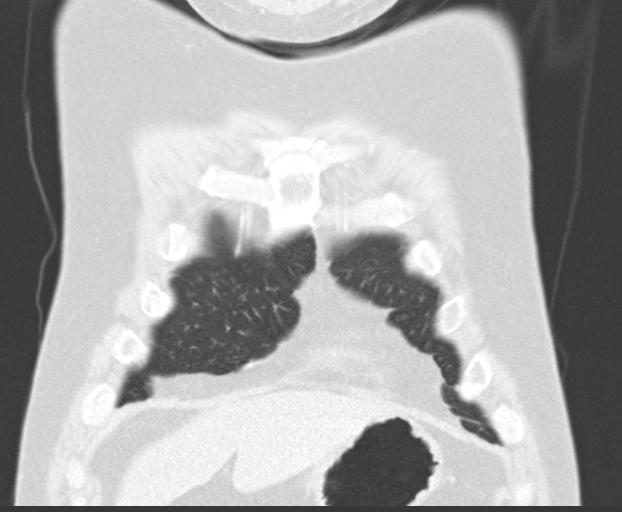
[im 36/89  lung]
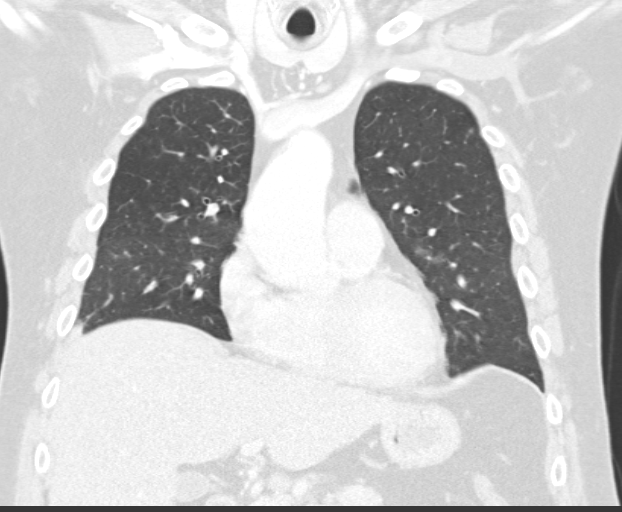
[im 53/89  lung]
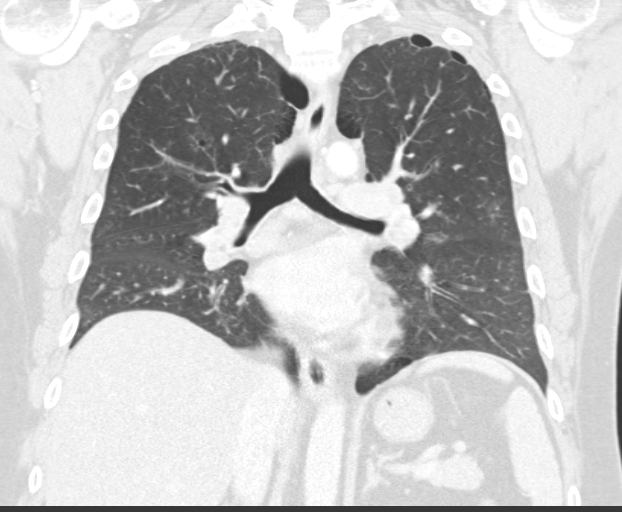

[15 of 36 positions shown; findings below may reference images not displayed]

FINDINGS: Study was not performed as a CTA for evaluation of the pulmonary
arteries. However, there are segmental pulmonary emboli within all
lobes. Overall clot burden is moderate. No findings to suggest right
heart strain. RV-to-LV ratio 0.85.

8 x 6 mm triangular subpleural nodular opacity in the lateral right
middle lobe, corresponding to the radiographic abnormality, favored
to reflect a subpleural lymph node but technically indeterminate.

Faint ground-glass nodular opacities in the left upper lobe/ lingula
(series 3/ images 27, 29, 31, and 34).

Mild paraseptal emphysematous changes. Trace right pleural effusion.
No pneumothorax.

Heart is normal in size. No pericardial effusion. Ectasia of the
ascending thoracic aorta measuring 4.0 cm (series 2/image 25).

Small mediastinal lymph nodes, including a mildly prominent
subcarinal node measuring 11 mm short axis (series 2/image 29).

Visualized upper abdomen is unremarkable.

Mild degenerative changes of the visualized thoracolumbar spine.
IMPRESSION: Segmental pulmonary emboli within all lobes. Overall clot burden is
moderate.

8 x 6 mm triangular subpleural nodule in the lateral right middle
lobe, corresponding to the radiographic abnormality, favored to
reflect a benign subpleural lymph node but technically
indeterminate. Given patient's high risk for primary bronchogenic
neoplasm, initial follow-up CT chest is suggested in 6 months.

Faint ground-glass nodular opacities in the left upper lobe/lingula,
possibly infectious/inflammatory.

Trace right pleural effusion.

Nodule recommendation follows the consensus statement: Guidelines
for Management of Small Pulmonary Nodules Detected on CT Scans: A
Statement from the [HOSPITAL] as published in Radiology
4007; [DATE].

Critical value/emergent results were called by telephone at the time
of interpretation on 05/06/2013 at [DATE] to MD ASIQULLAH JANIFA, who
verbally acknowledged these results.

## 2015-10-24 IMAGING — US US EXTREM LOW VENOUS BILAT
1 series · 14 of 24 positions shown · non-contrast
Comparison: none

[Series 1: us extrem low venous bilat · 0.06mm/px · 14 of 58 slices shown]
[im 1/58]
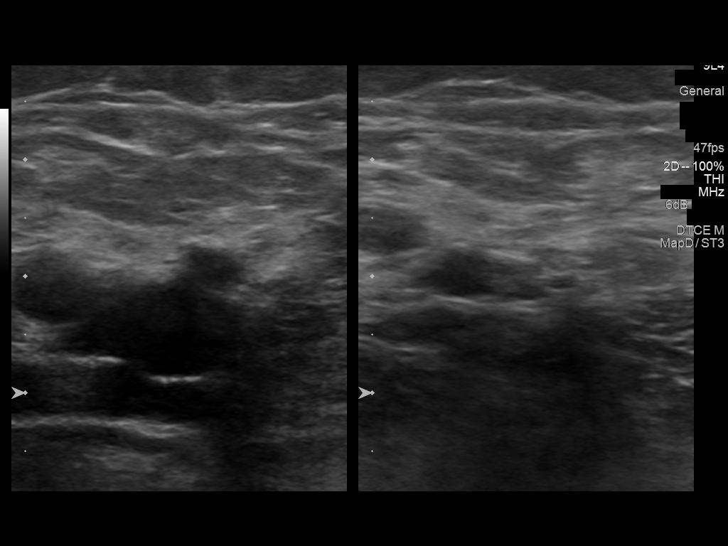
[im 5/58]
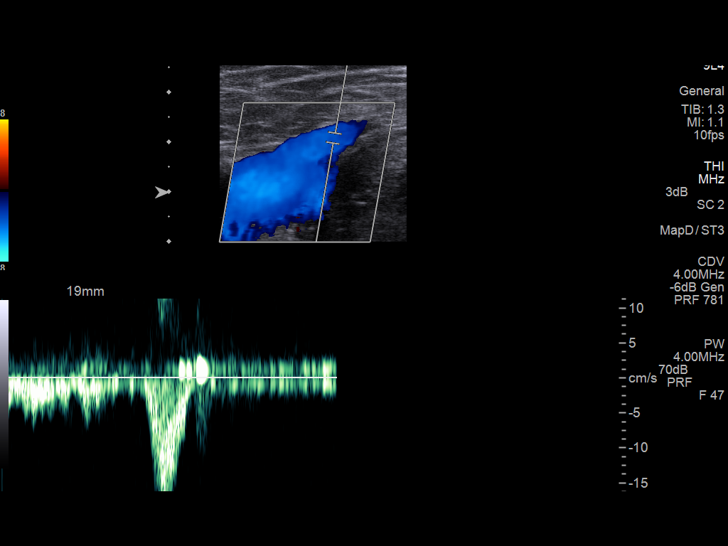
[im 10/58]
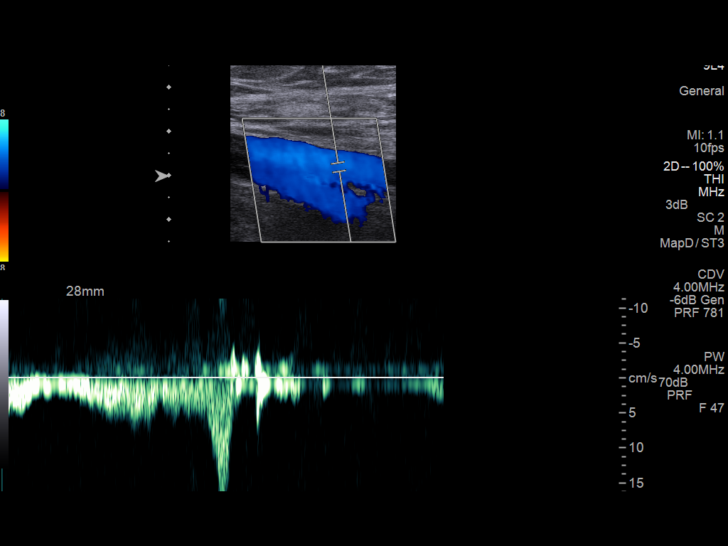
[im 15/58]
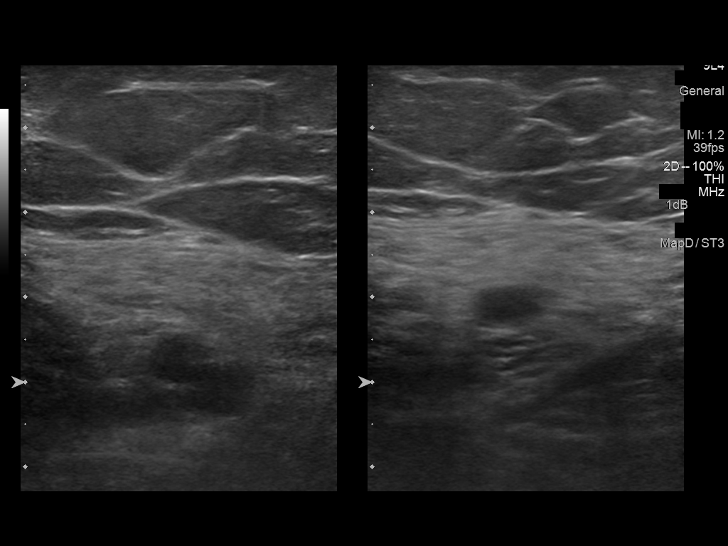
[im 18/58]
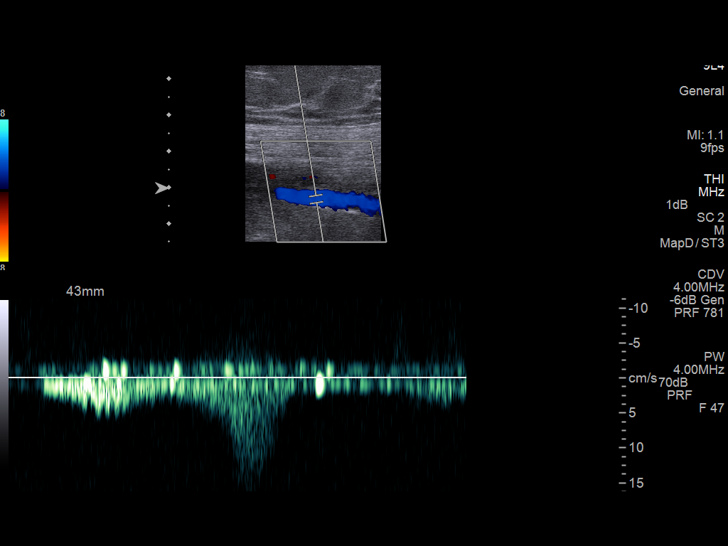
[im 23/58]
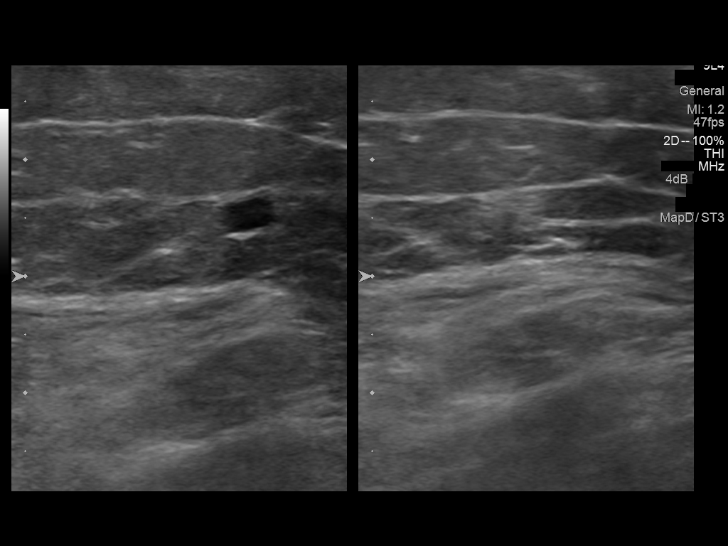
[im 28/58]
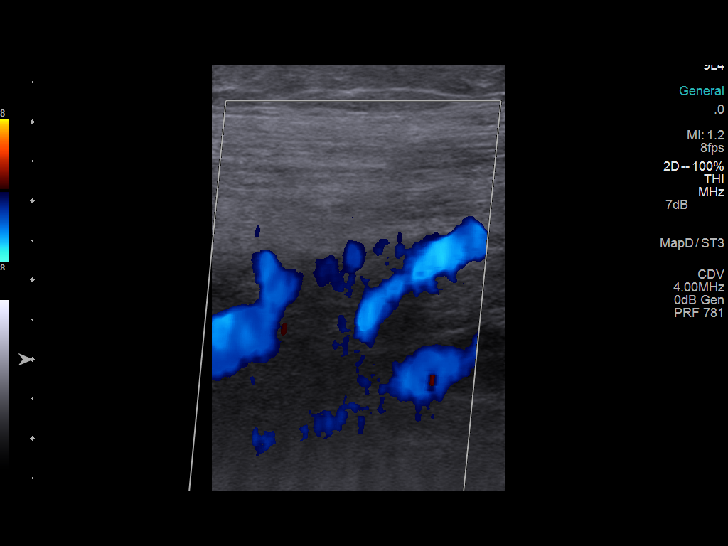
[im 30/58]
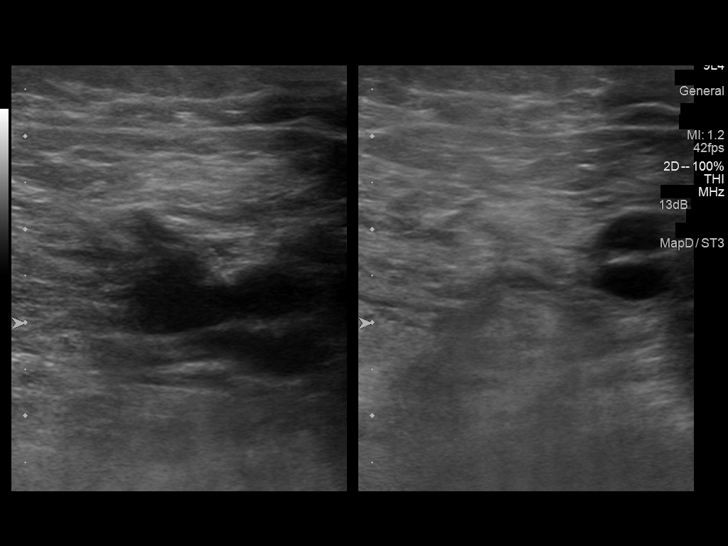
[im 35/58]
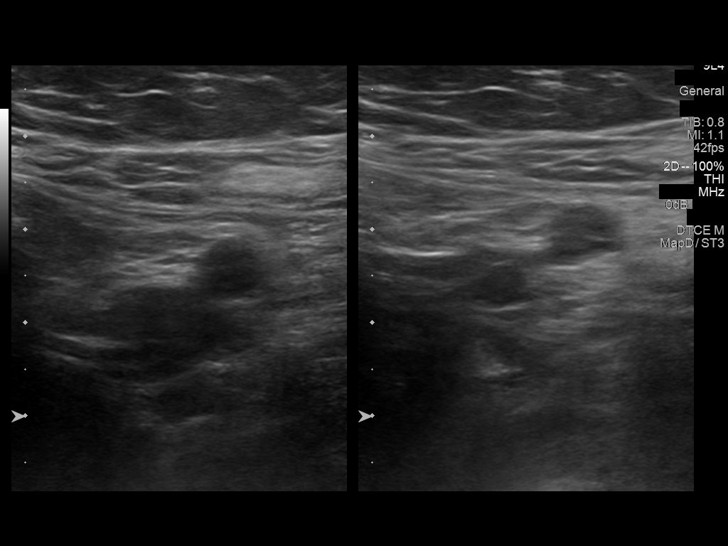
[im 40/58]
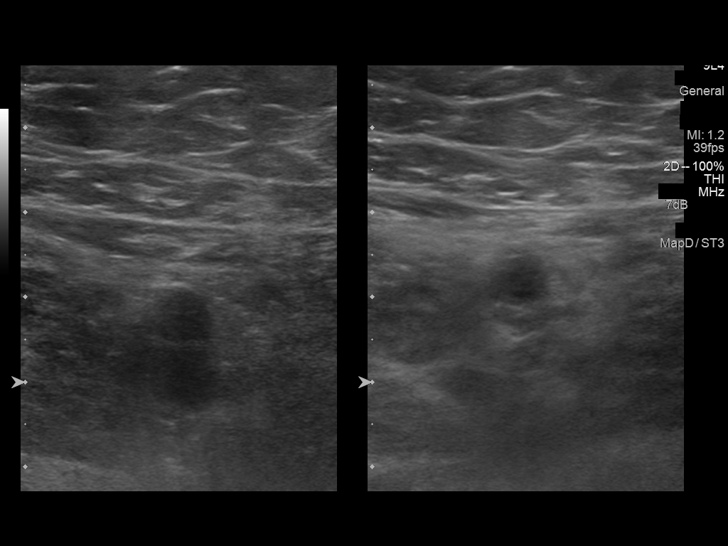
[im 45/58]
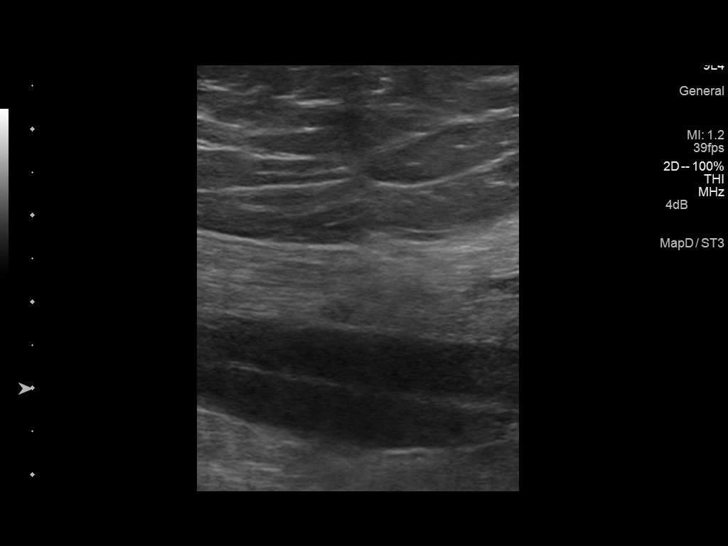
[im 48/58]
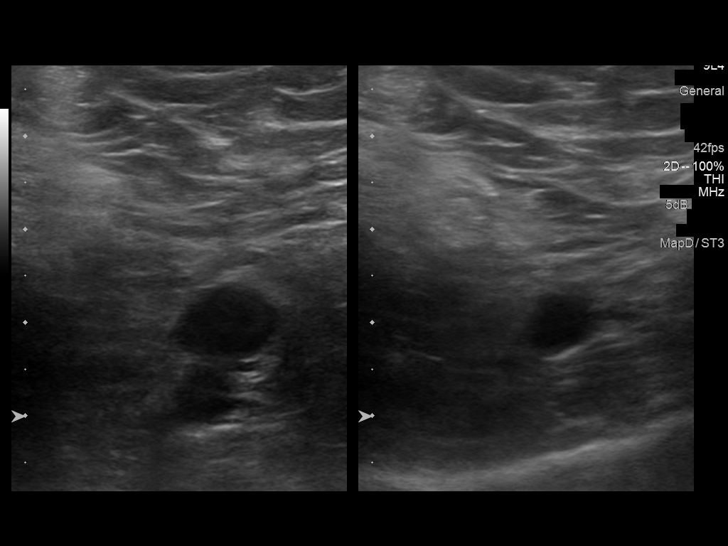
[im 53/58]
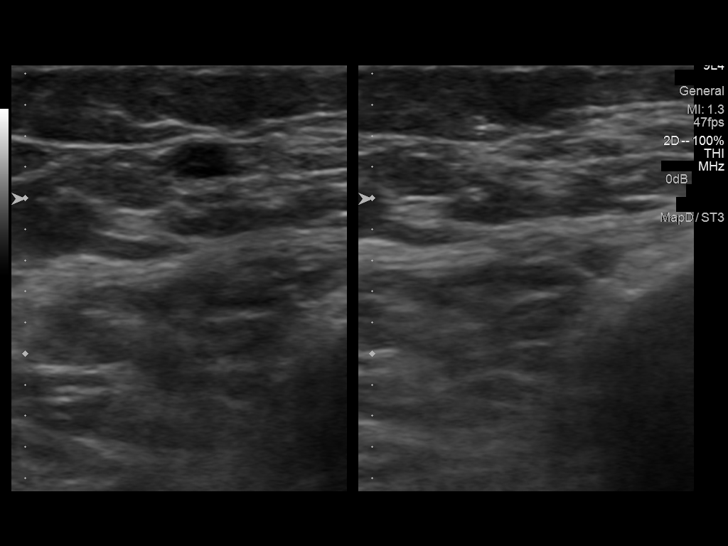
[im 58/58]
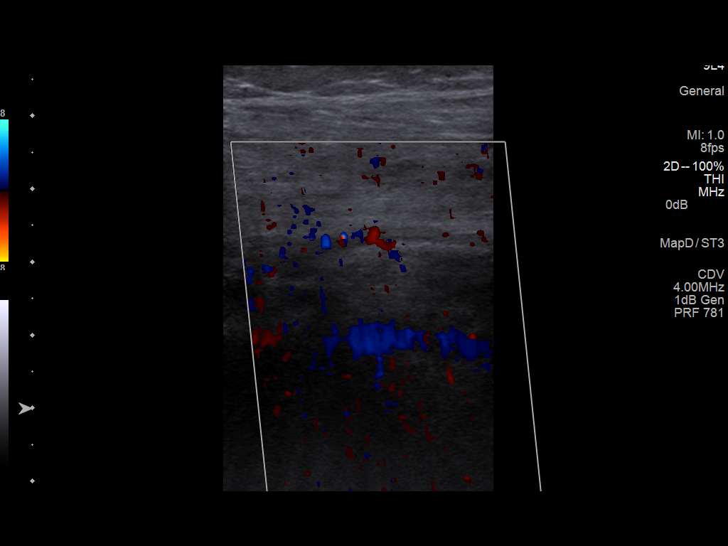

[14 of 24 positions shown; findings below may reference images not displayed]

CLINICAL DATA
Acute pulmonary embolism question deep venous thrombosis

EXAM
Bilateral LOWER EXTREMITY VENOUS DOPPLER ULTRASOUND

TECHNIQUE
Gray-scale sonography with graded compression, as well as color
Doppler and duplex ultrasound, were performed to evaluate the deep
venous system from the level of the common femoral vein through the
popliteal and proximal calf veins. Spectral Doppler was utilized to
evaluate flow at rest and with distal augmentation maneuvers.

COMPARISON
None

FINDINGS
Thrombus within deep veins:  None visualized

Compressibility of deep veins:  Normal.

Duplex waveform respiratory phasicity:  Normal.

Duplex waveform response to augmentation:  Normal.

Venous reflux:  None visualized.

Other findings: None visualized. Limited visualization of the calf
veins bilaterally.

IMPRESSION
No evidence of deep venous thrombosis in the lower extremities.

SIGNATURE

## 2015-10-27 ENCOUNTER — Other Ambulatory Visit: Payer: Self-pay | Admitting: *Deleted

## 2015-10-27 DIAGNOSIS — F259 Schizoaffective disorder, unspecified: Secondary | ICD-10-CM

## 2015-10-27 NOTE — Progress Notes (Signed)
Order for home health was placed at visit on 10/19/15 but wasn't the proper order. New order placed. Will forward to Dr Livia Snellen for review and to complete the fields that I was unsure of. Will ask him to forward back to me once complete so that I can send it to the appropriate agency.

## 2015-10-28 ENCOUNTER — Ambulatory Visit (INDEPENDENT_AMBULATORY_CARE_PROVIDER_SITE_OTHER): Payer: Medicare Other | Admitting: Psychology

## 2015-10-28 DIAGNOSIS — F250 Schizoaffective disorder, bipolar type: Secondary | ICD-10-CM | POA: Diagnosis not present

## 2015-10-30 DIAGNOSIS — F17210 Nicotine dependence, cigarettes, uncomplicated: Secondary | ICD-10-CM | POA: Diagnosis present

## 2015-10-30 DIAGNOSIS — J439 Emphysema, unspecified: Secondary | ICD-10-CM | POA: Diagnosis present

## 2015-10-30 DIAGNOSIS — F332 Major depressive disorder, recurrent severe without psychotic features: Secondary | ICD-10-CM | POA: Diagnosis not present

## 2015-10-30 DIAGNOSIS — F17200 Nicotine dependence, unspecified, uncomplicated: Secondary | ICD-10-CM | POA: Diagnosis not present

## 2015-10-30 DIAGNOSIS — F410 Panic disorder [episodic paroxysmal anxiety] without agoraphobia: Secondary | ICD-10-CM | POA: Diagnosis not present

## 2015-10-30 DIAGNOSIS — R079 Chest pain, unspecified: Secondary | ICD-10-CM | POA: Diagnosis not present

## 2015-10-30 DIAGNOSIS — I10 Essential (primary) hypertension: Secondary | ICD-10-CM | POA: Diagnosis present

## 2015-10-30 DIAGNOSIS — I519 Heart disease, unspecified: Secondary | ICD-10-CM | POA: Diagnosis not present

## 2015-10-30 DIAGNOSIS — F314 Bipolar disorder, current episode depressed, severe, without psychotic features: Secondary | ICD-10-CM | POA: Diagnosis present

## 2015-10-30 DIAGNOSIS — F418 Other specified anxiety disorders: Secondary | ICD-10-CM | POA: Diagnosis not present

## 2015-10-30 DIAGNOSIS — Z79899 Other long term (current) drug therapy: Secondary | ICD-10-CM | POA: Diagnosis not present

## 2015-10-30 DIAGNOSIS — K5900 Constipation, unspecified: Secondary | ICD-10-CM | POA: Diagnosis present

## 2015-10-30 DIAGNOSIS — R45851 Suicidal ideations: Secondary | ICD-10-CM | POA: Diagnosis not present

## 2015-10-30 NOTE — Progress Notes (Signed)
Reviewed and completed by Dr Livia Snellen. Will send order to [facility].

## 2015-11-06 ENCOUNTER — Telehealth: Payer: Self-pay | Admitting: Family Medicine

## 2015-11-07 ENCOUNTER — Other Ambulatory Visit (HOSPITAL_COMMUNITY): Payer: Self-pay | Admitting: Psychiatry

## 2015-11-09 DIAGNOSIS — Z951 Presence of aortocoronary bypass graft: Secondary | ICD-10-CM | POA: Diagnosis not present

## 2015-11-09 DIAGNOSIS — Z86711 Personal history of pulmonary embolism: Secondary | ICD-10-CM | POA: Diagnosis not present

## 2015-11-09 DIAGNOSIS — E785 Hyperlipidemia, unspecified: Secondary | ICD-10-CM | POA: Diagnosis not present

## 2015-11-09 DIAGNOSIS — F258 Other schizoaffective disorders: Secondary | ICD-10-CM | POA: Diagnosis not present

## 2015-11-09 DIAGNOSIS — Z7982 Long term (current) use of aspirin: Secondary | ICD-10-CM | POA: Diagnosis not present

## 2015-11-09 DIAGNOSIS — F17210 Nicotine dependence, cigarettes, uncomplicated: Secondary | ICD-10-CM | POA: Diagnosis not present

## 2015-11-09 DIAGNOSIS — F329 Major depressive disorder, single episode, unspecified: Secondary | ICD-10-CM | POA: Diagnosis not present

## 2015-11-09 DIAGNOSIS — F419 Anxiety disorder, unspecified: Secondary | ICD-10-CM | POA: Diagnosis not present

## 2015-11-11 ENCOUNTER — Encounter (HOSPITAL_COMMUNITY): Payer: Self-pay | Admitting: Psychiatry

## 2015-11-11 ENCOUNTER — Ambulatory Visit (INDEPENDENT_AMBULATORY_CARE_PROVIDER_SITE_OTHER): Payer: Medicare Other | Admitting: Psychiatry

## 2015-11-11 VITALS — BP 116/61 | HR 71 | Ht 72.0 in | Wt 232.2 lb

## 2015-11-11 DIAGNOSIS — F250 Schizoaffective disorder, bipolar type: Secondary | ICD-10-CM

## 2015-11-11 MED ORDER — LURASIDONE HCL 80 MG PO TABS: 80.0000 mg | ORAL_TABLET | Freq: Every day | ORAL | 2 refills | Status: DC

## 2015-11-11 MED ORDER — TRAZODONE HCL 50 MG PO TABS: 50.0000 mg | ORAL_TABLET | Freq: Every day | ORAL | 2 refills | Status: DC

## 2015-11-11 MED ORDER — OXCARBAZEPINE 300 MG PO TABS
300.0000 mg | ORAL_TABLET | Freq: Three times a day (TID) | ORAL | 2 refills | Status: DC
Start: 2015-11-11 — End: 2015-12-09

## 2015-11-11 MED ORDER — SERTRALINE HCL 100 MG PO TABS: 100.0000 mg | ORAL_TABLET | Freq: Every day | ORAL | 2 refills | Status: DC

## 2015-11-11 MED ORDER — HYDROXYZINE HCL 25 MG PO TABS: 25.0000 mg | ORAL_TABLET | Freq: Three times a day (TID) | ORAL | 2 refills | Status: DC | PRN
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
## 2015-11-12 DIAGNOSIS — Z7982 Long term (current) use of aspirin: Secondary | ICD-10-CM | POA: Diagnosis not present

## 2015-11-12 DIAGNOSIS — F419 Anxiety disorder, unspecified: Secondary | ICD-10-CM | POA: Diagnosis not present

## 2015-11-12 DIAGNOSIS — F17210 Nicotine dependence, cigarettes, uncomplicated: Secondary | ICD-10-CM | POA: Diagnosis not present

## 2015-11-12 DIAGNOSIS — E785 Hyperlipidemia, unspecified: Secondary | ICD-10-CM | POA: Diagnosis not present

## 2015-11-12 DIAGNOSIS — F329 Major depressive disorder, single episode, unspecified: Secondary | ICD-10-CM | POA: Diagnosis not present

## 2015-11-12 DIAGNOSIS — F258 Other schizoaffective disorders: Secondary | ICD-10-CM | POA: Diagnosis not present

## 2015-11-17 DIAGNOSIS — E785 Hyperlipidemia, unspecified: Secondary | ICD-10-CM | POA: Diagnosis not present

## 2015-11-17 DIAGNOSIS — Z7982 Long term (current) use of aspirin: Secondary | ICD-10-CM | POA: Diagnosis not present

## 2015-11-17 DIAGNOSIS — F17210 Nicotine dependence, cigarettes, uncomplicated: Secondary | ICD-10-CM | POA: Diagnosis not present

## 2015-11-17 DIAGNOSIS — F419 Anxiety disorder, unspecified: Secondary | ICD-10-CM | POA: Diagnosis not present

## 2015-11-17 DIAGNOSIS — F329 Major depressive disorder, single episode, unspecified: Secondary | ICD-10-CM | POA: Diagnosis not present

## 2015-11-17 DIAGNOSIS — F258 Other schizoaffective disorders: Secondary | ICD-10-CM | POA: Diagnosis not present

## 2015-11-19 DIAGNOSIS — F17210 Nicotine dependence, cigarettes, uncomplicated: Secondary | ICD-10-CM | POA: Diagnosis not present

## 2015-11-19 DIAGNOSIS — F258 Other schizoaffective disorders: Secondary | ICD-10-CM | POA: Diagnosis not present

## 2015-11-19 DIAGNOSIS — F329 Major depressive disorder, single episode, unspecified: Secondary | ICD-10-CM | POA: Diagnosis not present

## 2015-11-19 DIAGNOSIS — F419 Anxiety disorder, unspecified: Secondary | ICD-10-CM | POA: Diagnosis not present

## 2015-11-19 DIAGNOSIS — E785 Hyperlipidemia, unspecified: Secondary | ICD-10-CM | POA: Diagnosis not present

## 2015-11-19 DIAGNOSIS — Z7982 Long term (current) use of aspirin: Secondary | ICD-10-CM | POA: Diagnosis not present

## 2015-11-23 DIAGNOSIS — Z7982 Long term (current) use of aspirin: Secondary | ICD-10-CM | POA: Diagnosis not present

## 2015-11-23 DIAGNOSIS — F17210 Nicotine dependence, cigarettes, uncomplicated: Secondary | ICD-10-CM | POA: Diagnosis not present

## 2015-11-23 DIAGNOSIS — F419 Anxiety disorder, unspecified: Secondary | ICD-10-CM | POA: Diagnosis not present

## 2015-11-23 DIAGNOSIS — F329 Major depressive disorder, single episode, unspecified: Secondary | ICD-10-CM | POA: Diagnosis not present

## 2015-11-23 DIAGNOSIS — E785 Hyperlipidemia, unspecified: Secondary | ICD-10-CM | POA: Diagnosis not present

## 2015-11-23 DIAGNOSIS — F258 Other schizoaffective disorders: Secondary | ICD-10-CM | POA: Diagnosis not present

## 2015-11-24 ENCOUNTER — Ambulatory Visit (INDEPENDENT_AMBULATORY_CARE_PROVIDER_SITE_OTHER): Payer: Medicare Other | Admitting: Psychology

## 2015-11-24 DIAGNOSIS — F17210 Nicotine dependence, cigarettes, uncomplicated: Secondary | ICD-10-CM | POA: Diagnosis not present

## 2015-11-24 DIAGNOSIS — F329 Major depressive disorder, single episode, unspecified: Secondary | ICD-10-CM | POA: Diagnosis not present

## 2015-11-24 DIAGNOSIS — F250 Schizoaffective disorder, bipolar type: Secondary | ICD-10-CM

## 2015-11-24 DIAGNOSIS — F258 Other schizoaffective disorders: Secondary | ICD-10-CM | POA: Diagnosis not present

## 2015-11-24 DIAGNOSIS — E785 Hyperlipidemia, unspecified: Secondary | ICD-10-CM | POA: Diagnosis not present

## 2015-11-24 DIAGNOSIS — F419 Anxiety disorder, unspecified: Secondary | ICD-10-CM | POA: Diagnosis not present

## 2015-11-24 DIAGNOSIS — Z7982 Long term (current) use of aspirin: Secondary | ICD-10-CM | POA: Diagnosis not present

## 2015-11-26 ENCOUNTER — Encounter (HOSPITAL_COMMUNITY): Payer: Self-pay | Admitting: Psychology

## 2015-11-26 NOTE — Progress Notes (Signed)
Patient:  Ian Grimes   DOB: Mar 10, 1970  MR Number: [id number]  Location: [facility] [address] [place] [address] Dept: [phone]  Start: 3 PM End: 4 PM  Provider/Observer:     Edgardo Roys PSYD  Chief Complaint:      Chief Complaint  Patient presents with  . Anxiety  . Depression  . Stress    Reason For Service:     The patient was referred by Dr. Harrington Challenger for psychotherapeutic interventions due to ongoing issues with schizoaffective disorder.  The patient had a very try and difficult childhood with what is reported as a lack of parental help her control. His mother abandoned the family when he was young and his father at all kinds of substance abuse issues and legal issues. The patient was shuttled between various family members throughout his life. The patient has had multiple hospitalizations for psychiatric issues to the years and has been diagnosed with schizoaffective disorder. He is very isolated and has difficulty coping with day-to-day stressors.  Interventions Strategy:  Cognitive/behavioral psychotherapeutic interventions  Participation Level:   Active  Participation Quality:  Appropriate      Behavioral Observation:  Well Groomed, Alert, and Appropriate.   Current Psychosocial Factors: The patient reports that he is generally isolated and does not spend much time around others.  Content of Session:   Reviewed current symptoms and whenever historical issues and worked on Adult nurse. We worked on cognitive/behavioral psychotherapeutic interventions.  Current Status:   The patient reports that his psychotic symptoms and his agitation have been fairly stable and he is responding well to his medications for the most part. He does report that latuda seems to be making very sick sleepy  Patient Progress:   Stable  Target Goals:   Target goals include building better  coping skills and strategies Reducing intensity, frequency, and duration of his psychotic and mood based symptoms.  Last Reviewed:   11/24/2015  Goals Addressed Today:    Goals addressed today had to do with building better resources and skills.  Impression/Diagnosis:   The patient is a long history of diagnosis of schizoaffective disorder. He has had multiple hospitalizations and has been treated with both mood stabilizing agents as well as antipsychotic agents. The patient had a very traumatic and difficult childhood having a very dysfunctional parents. He's had difficulty coping with life essentially from his childhood on.  Diagnosis:    Axis I: Schizoaffective disorder, bipolar type ([place])    Kaina Orengo R, PsyD 11/26/2015

## 2015-12-01 DIAGNOSIS — F329 Major depressive disorder, single episode, unspecified: Secondary | ICD-10-CM | POA: Diagnosis not present

## 2015-12-01 DIAGNOSIS — F258 Other schizoaffective disorders: Secondary | ICD-10-CM | POA: Diagnosis not present

## 2015-12-01 DIAGNOSIS — F17210 Nicotine dependence, cigarettes, uncomplicated: Secondary | ICD-10-CM | POA: Diagnosis not present

## 2015-12-01 DIAGNOSIS — E785 Hyperlipidemia, unspecified: Secondary | ICD-10-CM | POA: Diagnosis not present

## 2015-12-01 DIAGNOSIS — Z7982 Long term (current) use of aspirin: Secondary | ICD-10-CM | POA: Diagnosis not present

## 2015-12-01 DIAGNOSIS — F419 Anxiety disorder, unspecified: Secondary | ICD-10-CM | POA: Diagnosis not present

## 2015-12-02 ENCOUNTER — Ambulatory Visit (INDEPENDENT_AMBULATORY_CARE_PROVIDER_SITE_OTHER): Payer: Medicare Other | Admitting: Family Medicine

## 2015-12-02 ENCOUNTER — Encounter: Payer: Self-pay | Admitting: Family Medicine

## 2015-12-02 VITALS — BP 128/81 | HR 63 | Temp 98.4°F | Ht 72.0 in | Wt 224.4 lb

## 2015-12-02 DIAGNOSIS — E785 Hyperlipidemia, unspecified: Secondary | ICD-10-CM

## 2015-12-02 DIAGNOSIS — J329 Chronic sinusitis, unspecified: Secondary | ICD-10-CM | POA: Insufficient documentation

## 2015-12-02 DIAGNOSIS — E8801 Alpha-1-antitrypsin deficiency: Secondary | ICD-10-CM

## 2015-12-02 DIAGNOSIS — J40 Bronchitis, not specified as acute or chronic: Secondary | ICD-10-CM

## 2015-12-02 DIAGNOSIS — F258 Other schizoaffective disorders: Secondary | ICD-10-CM

## 2015-12-02 MED ORDER — AZITHROMYCIN 250 MG PO TABS: ORAL_TABLET | ORAL | 0 refills | Status: DC

## 2015-12-02 NOTE — Progress Notes (Signed)
Subjective:  Patient ID: Ian Grimes, male    DOB: [birth year]  Age: 45 y.o. MRN: [id number]  CC: Anxiety (recheck, (patient goes to [facility]).)   HPI Ian Grimes presents for Feeling stir crazy at home. He says he is seeing Dr. Harrington Challenger for these problems over in [place]. She has been managing his medication. He is also seeing a psychologist Dr. Ilean Skill. Over the last couple of weeks he has had a lot of cough and congestion. He brings up some thick and colored sputum in the mornings. It seems to been a bit later in the day. However he has not been short of breath. He does have some upper respiratory congestion but it's primarily in his chest. Some of his symptoms are attributable to the pulmonary component of his hip alpha-1 antitrypsin deficiency and subsequent emphysematous change. This is been ongoing for many years but the acute exacerbation has been ongoing for 2 weeks.  We discussed his pH Q and GAD scores below. He is just sitting all day watching television. He wants to get out but doesn't have transportation. We discussed what is available within walking distance. Apparently there is a Art therapist and a park. He exhibits some interest in both of these. Additionally he is interested in walking for exercise. Patient mentions that his appetite has been poor. He also has been having some sweating spells. He is feeling some compulsive urges when he is at home. He has to be up doing something.  Depression screen [facility] 2/9 12/02/2015 10/19/2015 08/25/2015 11/20/2014 03/12/2014  Decreased Interest _0 Down, Depressed, Hopeless _1 PHQ - 2 Score _2 Altered sleeping _3 Tired, decreased energy _4 Change in appetite _5 Feeling bad or failure about yourself  _6 Trouble concentrating _7 Moving slowly or fidgety/restless _8 Suicidal thoughts 0 0 1 0 1  PHQ-9 Score _9 Difficult doing work/chores Very  difficult Very difficult - - -   GAD 7 : Generalized Anxiety Score 12/02/2015 10/19/2015  Nervous, Anxious, on Edge 3 3  Control/stop worrying 3 3  Worry too much - different things 3 3  Trouble relaxing 3 3  Restless 3 3  Easily annoyed or irritable 3 3  Afraid - awful might happen 3 3  Total GAD 7 Score 21 21  Anxiety Difficulty Very difficult Very difficult     History Ian Grimes has a past medical history of Anxiety; Depression; HA (headache); echocardiogram; Hyperlipidemia; Pleural effusion; Pulmonary emboli ([place]); Pulmonary nodule; and Schizo-affective psychosis ([place]).   He has a past surgical history that includes rib injury; Cardiac catheterization; Intraoprative transesophageal echocardiogram (N/A, 11/14/2013); Ascending aortic root replacement (N/A, 11/14/2013); and left and right heart catheterization with coronary angiogram (N/A, 11/01/2013).   His family history includes Alcohol abuse in his father; Asthma in his paternal aunt; COPD in his father and paternal aunt; Cancer in his father; Diabetes in his paternal grandfather; Heart attack in his father; Heart disease in his father; Stroke in his father and paternal uncle.He reports that he has been smoking Cigarettes.  He has a 22.00 pack-year smoking history. He has never used smokeless tobacco. He reports that he does not drink alcohol or use drugs.    ROS  Review of Systems  Constitutional: Negative for activity change, appetite change, chills, diaphoresis and fever.  HENT: Positive for congestion and sore throat. Negative for ear discharge, ear pain, hearing loss, nosebleeds, postnasal drip, rhinorrhea, sinus pressure, sneezing and trouble swallowing.   Respiratory: Positive for cough and chest tightness. Negative for shortness of breath.   Cardiovascular: Negative for chest pain and palpitations.  Gastrointestinal: Negative for abdominal pain.  Musculoskeletal: Negative for arthralgias and myalgias.  Skin: Negative for rash.    Neurological: Negative for weakness and headaches.    Objective:  BP 128/81   Pulse 63   Temp 98.4 F (36.9 C) (Oral)   Ht 6' (1.829 m)   Wt 224 lb 6.4 oz (101.8 kg)   BMI 30.43 kg/m   BP Readings from Last 3 Encounters:  12/02/15 128/81  10/19/15 102/72  08/25/15 118/78    Wt Readings from Last 3 Encounters:  12/02/15 224 lb 6.4 oz (101.8 kg)  10/19/15 229 lb 12.8 oz (104.2 kg)  08/25/15 231 lb 3.2 oz (104.9 kg)     Physical Exam  Constitutional: He appears well-developed and well-nourished.  HENT:  Head: Normocephalic and atraumatic.  Right Ear: Tympanic membrane and external ear normal. No decreased hearing is noted.  Left Ear: Tympanic membrane and external ear normal. No decreased hearing is noted.  Mouth/Throat: No oropharyngeal exudate or posterior oropharyngeal erythema.  Eyes: Pupils are equal, round, and reactive to light.  Neck: Normal range of motion. Neck supple.  Cardiovascular: Normal rate and regular rhythm.   No murmur heard. Pulmonary/Chest: Breath sounds normal. No respiratory distress.  Abdominal: Soft. Bowel sounds are normal. He exhibits no mass. There is no tenderness.  Vitals reviewed.    Lab Results  Component Value Date   WBC 10.6 08/25/2015   HGB 10.4 (L) 11/18/2013   HCT 46.7 08/25/2015   PLT 288 08/25/2015   GLUCOSE 87 08/25/2015   CHOL 194 08/25/2015   TRIG 365 (H) 08/25/2015   HDL 19 (L) 08/25/2015   LDLCALC 102 (H) 08/25/2015   ALT 20 08/25/2015   AST 20 08/25/2015   NA 144 08/25/2015   K 4.2 08/25/2015   CL 105 08/25/2015   CREATININE 1.03 08/25/2015   BUN 9 08/25/2015   CO2 23 08/25/2015   TSH 1.302 05/07/2013   PSA 0.5 04/30/2013   INR 0.99 11/12/2013   HGBA1C 5.6 11/12/2013    No results found.  Assessment & Plan:   Ian Grimes was seen today for anxiety.  Diagnoses and all orders for this visit:  Other schizoaffective disorders ([place]) -     CBC with Differential/Platelet -      CMP14+EGFR  Sinobronchitis -     CBC with Differential/Platelet -     CMP14+EGFR  Hyperlipidemia with target low density lipoprotein (LDL) cholesterol less than 100 mg/dL -     CBC with Differential/Platelet -     CMP14+EGFR -     Lipid panel  Alpha-1-antitrypsin deficiency ([place])  Other orders -     azithromycin (ZITHROMAX Z-PAK) 250 MG tablet; Take two right away Then one a day for the next 4 days.    I have encouraged the patient to follow-up as anticipated in one week with Dr. Harrington Challenger. He is to make contact with his psychologist as well. We talked in depth about outlets for his energy. He should follow up here if these do not pan out for him. Otherwise 6 months.  I have discontinued Mr. Moncus fenofibrate micronized. I am  also having him start on azithromycin. Additionally, I am having him maintain his aspirin, pravastatin, ezetimibe, ibuprofen, metoprolol tartrate, diclofenac, gabapentin, Oxcarbazepine, lurasidone, sertraline, traZODone, and hydrOXYzine.  Meds ordered this encounter  Medications  . azithromycin (ZITHROMAX Z-PAK) 250 MG tablet    Sig: Take two right away Then one a day for the next 4 days.    Dispense:  6 each    Refill:  0     Follow-up: Return in about 6 months (around 06/01/2016).  Claretta Fraise, M.D.

## 2015-12-03 ENCOUNTER — Other Ambulatory Visit: Payer: Self-pay | Admitting: Family Medicine

## 2015-12-03 LAB — CBC WITH DIFFERENTIAL/PLATELET
BASOS: 0 %
Basophils Absolute: 0.1 10*3/uL (ref 0.0–0.2)
EOS (ABSOLUTE): 0.4 10*3/uL (ref 0.0–0.4)
EOS: 2 %
HEMATOCRIT: 47.7 % (ref 37.5–51.0)
Hemoglobin: 16.2 g/dL (ref 12.6–17.7)
IMMATURE GRANULOCYTES: 1 %
Immature Grans (Abs): 0.1 10*3/uL (ref 0.0–0.1)
Lymphocytes Absolute: 3.9 10*3/uL — ABNORMAL HIGH (ref 0.7–3.1)
Lymphs: 24 %
MCH: 32.7 pg (ref 26.6–33.0)
MCHC: 34 g/dL (ref 31.5–35.7)
MCV: 96 fL (ref 79–97)
MONOS ABS: 0.8 10*3/uL (ref 0.1–0.9)
Monocytes: 5 %
NEUTROS ABS: 10.9 10*3/uL — AB (ref 1.4–7.0)
NEUTROS PCT: 68 %
PLATELETS: 389 10*3/uL — AB (ref 150–379)
RBC: 4.95 x10E6/uL (ref 4.14–5.80)
RDW: 13.8 % (ref 12.3–15.4)
WBC: 16.2 10*3/uL — AB (ref 3.4–10.8)

## 2015-12-03 LAB — CMP14+EGFR
A/G RATIO: 2.1 (ref 1.2–2.2)
ALT: 17 IU/L (ref 0–44)
AST: 17 IU/L (ref 0–40)
Albumin: 4.7 g/dL (ref 3.5–5.5)
Alkaline Phosphatase: 97 IU/L (ref 39–117)
BUN/Creatinine Ratio: 11 (ref 9–20)
BUN: 9 mg/dL (ref 6–24)
CALCIUM: 10 mg/dL (ref 8.7–10.2)
CHLORIDE: 97 mmol/L (ref 96–106)
CO2: 25 mmol/L (ref 18–29)
Creatinine, Ser: 0.8 mg/dL (ref 0.76–1.27)
GFR, EST AFRICAN AMERICAN: 125 mL/min/{1.73_m2} (ref >59)
GFR, EST NON AFRICAN AMERICAN: 108 mL/min/{1.73_m2} (ref >59)
GLOBULIN, TOTAL: 2.2 g/dL (ref 1.5–4.5)
Glucose: 81 mg/dL (ref 65–99)
POTASSIUM: 5.3 mmol/L — AB (ref 3.5–5.2)
SODIUM: 139 mmol/L (ref 134–144)
TOTAL PROTEIN: 6.9 g/dL (ref 6.0–8.5)

## 2015-12-03 LAB — LIPID PANEL
CHOL/HDL RATIO: 12.3 ratio — AB (ref 0.0–5.0)
Cholesterol, Total: 234 mg/dL — ABNORMAL HIGH (ref 100–199)
HDL: 19 mg/dL — ABNORMAL LOW (ref >39)
LDL Calculated: 148 mg/dL — ABNORMAL HIGH (ref 0–99)
Triglycerides: 333 mg/dL — ABNORMAL HIGH (ref 0–149)
VLDL Cholesterol Cal: 67 mg/dL — ABNORMAL HIGH (ref 5–40)

## 2015-12-03 MED ORDER — ROSUVASTATIN CALCIUM 20 MG PO TABS: 20.0000 mg | ORAL_TABLET | Freq: Every day | ORAL | 3 refills | Status: DC

## 2015-12-07 ENCOUNTER — Ambulatory Visit: Payer: Self-pay | Admitting: Internal Medicine

## 2015-12-09 ENCOUNTER — Encounter (HOSPITAL_COMMUNITY): Payer: Self-pay | Admitting: Psychiatry

## 2015-12-09 ENCOUNTER — Ambulatory Visit (INDEPENDENT_AMBULATORY_CARE_PROVIDER_SITE_OTHER): Payer: Medicare Other | Admitting: Psychiatry

## 2015-12-09 VITALS — BP 130/86 | HR 65 | Ht 72.0 in | Wt 222.4 lb

## 2015-12-09 DIAGNOSIS — Z801 Family history of malignant neoplasm of trachea, bronchus and lung: Secondary | ICD-10-CM

## 2015-12-09 DIAGNOSIS — Z8249 Family history of ischemic heart disease and other diseases of the circulatory system: Secondary | ICD-10-CM

## 2015-12-09 DIAGNOSIS — F17210 Nicotine dependence, cigarettes, uncomplicated: Secondary | ICD-10-CM

## 2015-12-09 DIAGNOSIS — F250 Schizoaffective disorder, bipolar type: Secondary | ICD-10-CM

## 2015-12-09 DIAGNOSIS — Z833 Family history of diabetes mellitus: Secondary | ICD-10-CM

## 2015-12-09 DIAGNOSIS — Z7982 Long term (current) use of aspirin: Secondary | ICD-10-CM | POA: Diagnosis not present

## 2015-12-09 DIAGNOSIS — Z823 Family history of stroke: Secondary | ICD-10-CM

## 2015-12-09 DIAGNOSIS — Z79899 Other long term (current) drug therapy: Secondary | ICD-10-CM | POA: Diagnosis not present

## 2015-12-09 DIAGNOSIS — Z811 Family history of alcohol abuse and dependence: Secondary | ICD-10-CM

## 2015-12-09 MED ORDER — OXCARBAZEPINE 300 MG PO TABS: 300.0000 mg | ORAL_TABLET | Freq: Three times a day (TID) | ORAL | 2 refills | Status: DC

## 2015-12-09 MED ORDER — SERTRALINE HCL 100 MG PO TABS: 100.0000 mg | ORAL_TABLET | Freq: Every day | ORAL | 2 refills | Status: DC

## 2015-12-09 MED ORDER — LURASIDONE HCL 40 MG PO TABS: 40.0000 mg | ORAL_TABLET | Freq: Every day | ORAL | 2 refills | Status: DC

## 2015-12-09 MED ORDER — TRAZODONE HCL 100 MG PO TABS: 100.0000 mg | ORAL_TABLET | Freq: Every day | ORAL | 2 refills | Status: DC

## 2015-12-09 MED ORDER — HYDROXYZINE HCL 25 MG PO TABS: 25.0000 mg | ORAL_TABLET | Freq: Three times a day (TID) | ORAL | 2 refills | Status: DC | PRN
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
## 2015-12-19 ENCOUNTER — Other Ambulatory Visit: Payer: Self-pay | Admitting: Family Medicine

## 2015-12-23 ENCOUNTER — Ambulatory Visit (INDEPENDENT_AMBULATORY_CARE_PROVIDER_SITE_OTHER): Payer: Medicare Other | Admitting: Psychology

## 2015-12-23 DIAGNOSIS — F250 Schizoaffective disorder, bipolar type: Secondary | ICD-10-CM | POA: Diagnosis not present

## 2015-12-29 ENCOUNTER — Ambulatory Visit (INDEPENDENT_AMBULATORY_CARE_PROVIDER_SITE_OTHER): Payer: Medicare Other | Admitting: Family Medicine

## 2015-12-29 DIAGNOSIS — F419 Anxiety disorder, unspecified: Secondary | ICD-10-CM | POA: Diagnosis not present

## 2015-12-29 DIAGNOSIS — E785 Hyperlipidemia, unspecified: Secondary | ICD-10-CM

## 2015-12-29 DIAGNOSIS — F329 Major depressive disorder, single episode, unspecified: Secondary | ICD-10-CM

## 2015-12-29 DIAGNOSIS — F258 Other schizoaffective disorders: Secondary | ICD-10-CM | POA: Diagnosis not present

## 2015-12-30 ENCOUNTER — Encounter (HOSPITAL_COMMUNITY): Payer: Self-pay | Admitting: Psychology

## 2015-12-30 NOTE — Progress Notes (Signed)
Patient:  Ian Grimes   DOB: 05/22/1970  MR Number: [id number]  Location: [facility] [address] [place] [address] Dept: [phone]  Start: 3 PM End: 4 PM  Provider/Observer:     Edgardo Roys PSYD  Chief Complaint:      Chief Complaint  Patient presents with  . Anxiety  . Depression  . Paranoid    Reason For Service:     The patient was referred by Dr. Harrington Challenger for psychotherapeutic interventions due to ongoing issues with schizoaffective disorder.  The patient had a very try and difficult childhood with what is reported as a lack of parental help her control. His mother abandoned the family when he was young and his father at all kinds of substance abuse issues and legal issues. The patient was shuttled between various family members throughout his life. The patient has had multiple hospitalizations for psychiatric issues to the years and has been diagnosed with schizoaffective disorder. He is very isolated and has difficulty coping with day-to-day stressors.  Interventions Strategy:  Cognitive/behavioral psychotherapeutic interventions  Participation Level:   Active  Participation Quality:  Appropriate      Behavioral Observation:  Well Groomed, Alert, and Appropriate.   Current Psychosocial Factors: The patient reports that he is generally isolated and does not spend much time around others.  Content of Session:   Reviewed current symptoms and whenever historical issues and worked on Adult nurse. We worked on cognitive/behavioral psychotherapeutic interventions.  Current Status:   The patient reports that his psychotic symptoms and his agitation have been fairly stable and he is responding well to his medications for the most part. He does report that latuda seems to be making very sick sleepy  Patient Progress:   Stable  Target Goals:   Target goals include building  better coping skills and strategies Reducing intensity, frequency, and duration of his psychotic and mood based symptoms.  Last Reviewed:   08/13/2015  Goals Addressed Today:    Goals addressed today had to do with building better resources and skills.  Impression/Diagnosis:   The patient is a long history of diagnosis of schizoaffective disorder. He has had multiple hospitalizations and has been treated with both mood stabilizing agents as well as antipsychotic agents. The patient had a very traumatic and difficult childhood having a very dysfunctional parents. He's had difficulty coping with life essentially from his childhood on.  Diagnosis:    Axis I: Schizoaffective disorder, bipolar type ([place])    Izabell Schalk R, PsyD 12/30/2015
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
## 2015-12-30 NOTE — Progress Notes (Signed)
Patient:  Ian Grimes   DOB: 12/23/1970  MR Number: [id number]  Location: [facility] [address] [place] [address] Dept: [phone]  Start: 3 PM End: 4 PM  Provider/Observer:     Edgardo Roys PSYD  Chief Complaint:      Chief Complaint  Patient presents with  . Anxiety  . Depression  . Stress  . Paranoid    Reason For Service:     The patient was referred by Dr. Harrington Challenger for psychotherapeutic interventions due to ongoing issues with schizoaffective disorder.  The patient had a very try and difficult childhood with what is reported as a lack of parental help her control. His mother abandoned the family when he was young and his father at all kinds of substance abuse issues and legal issues. The patient was shuttled between various family members throughout his life. The patient has had multiple hospitalizations for psychiatric issues to the years and has been diagnosed with schizoaffective disorder. He is very isolated and has difficulty coping with day-to-day stressors.  Interventions Strategy:  Cognitive/behavioral psychotherapeutic interventions  Participation Level:   Active  Participation Quality:  Appropriate      Behavioral Observation:  Well Groomed, Alert, and Appropriate.   Current Psychosocial Factors: The patient reports that heHas been getting out of the house more and going for a while. He reports that he has been trying to interact more with a neighbor to work on his interpersonal and social skills and strategies we have been developing but that this type of interaction does cause him a lot of stress.   Content of Session:   Reviewed current symptoms and whenever historical issues and worked on Adult nurse. We worked on cognitive/behavioral psychotherapeutic interventions.  Current Status:   The patient reports that his psychotic symptoms and his agitation have  been fairly stable and he is responding well to his medications for the most part. He does report that latuda seems to be making very sick sleepy  Patient Progress:   Stable  Target Goals:   Target goals include building better coping skills and strategies Reducing intensity, frequency, and duration of his psychotic and mood based symptoms.  Last Reviewed:   10/23/2015  Goals Addressed Today:    Goals addressed today had to do with building better resources and skills.  Impression/Diagnosis:   The patient is a long history of diagnosis of schizoaffective disorder. He has had multiple hospitalizations and has been treated with both mood stabilizing agents as well as antipsychotic agents. The patient had a very traumatic and difficult childhood having a very dysfunctional parents. He's had difficulty coping with life essentially from his childhood on.  Diagnosis:    Axis I: Schizoaffective disorder, bipolar type ([place])    Kristiane Morsch R, PsyD 12/30/2015

## 2015-12-30 NOTE — Progress Notes (Signed)
Patient:  Ian Grimes   DOB: December 12, 1970  MR Number: [id number]  Location: [facility] [address] [place] [address] Dept: [phone]  Start: 3 PM End: 4 PM  Provider/Observer:     Edgardo Roys PSYD  Chief Complaint:      Chief Complaint  Patient presents with  . Anxiety  . Depression  . Paranoid  . Stress    Reason For Service:     The patient was referred by Dr. Harrington Challenger for psychotherapeutic interventions due to ongoing issues with schizoaffective disorder.  The patient had a very try and difficult childhood with what is reported as a lack of parental help her control. His mother abandoned the family when he was young and his father at all kinds of substance abuse issues and legal issues. The patient was shuttled between various family members throughout his life. The patient has had multiple hospitalizations for psychiatric issues to the years and has been diagnosed with schizoaffective disorder. He is very isolated and has difficulty coping with day-to-day stressors.  Interventions Strategy:  Cognitive/behavioral psychotherapeutic interventions  Participation Level:   Active  Participation Quality:  Appropriate      Behavioral Observation:  Well Groomed, Alert, and Appropriate.   Current Psychosocial Factors: The patient reports that heHas been getting out of the house more and going for a while. He reports that he has been trying to interact more with a neighbor to work on his interpersonal and social skills and strategies we have been developing but that this type of interaction does cause him a lot of stress.   Content of Session:   Reviewed current symptoms and whenever historical issues and worked on Adult nurse. We worked on cognitive/behavioral psychotherapeutic interventions.  Current Status:   The patient reports that his psychotic symptoms and his agitation have  been fairly stable and he is responding well to his medications for the most part. He does report that latuda seems to be making very sick sleepy  Patient Progress:   Stable  Target Goals:   Target goals include building better coping skills and strategies Reducing intensity, frequency, and duration of his psychotic and mood based symptoms.  Last Reviewed:   09/21/2015  Goals Addressed Today:    Goals addressed today had to do with building better resources and skills.  Impression/Diagnosis:   The patient is a long history of diagnosis of schizoaffective disorder. He has had multiple hospitalizations and has been treated with both mood stabilizing agents as well as antipsychotic agents. The patient had a very traumatic and difficult childhood having a very dysfunctional parents. He's had difficulty coping with life essentially from his childhood on.  Diagnosis:    Axis I: Schizoaffective disorder, bipolar type ([place])    Nashaly Dorantes R, PsyD 12/30/2015

## 2016-01-05 ENCOUNTER — Ambulatory Visit (INDEPENDENT_AMBULATORY_CARE_PROVIDER_SITE_OTHER): Payer: Medicare Other | Admitting: Internal Medicine

## 2016-01-05 ENCOUNTER — Encounter: Payer: Self-pay | Admitting: Internal Medicine

## 2016-01-05 VITALS — BP 118/74 | HR 59 | Ht 69.0 in | Wt 223.0 lb

## 2016-01-05 DIAGNOSIS — J439 Emphysema, unspecified: Secondary | ICD-10-CM

## 2016-01-05 DIAGNOSIS — E8801 Alpha-1-antitrypsin deficiency: Secondary | ICD-10-CM

## 2016-01-05 DIAGNOSIS — Z87891 Personal history of nicotine dependence: Secondary | ICD-10-CM

## 2016-01-05 DIAGNOSIS — Z86711 Personal history of pulmonary embolism: Secondary | ICD-10-CM

## 2016-01-05 LAB — PULMONARY FUNCTION TEST
DL/VA % pred: 93 %
DL/VA: 4.3 ml/min/mmHg/L
DLCO COR % PRED: 73 %
DLCO COR: 22.89 ml/min/mmHg
DLCO UNC % PRED: 72 %
DLCO UNC: 22.42 ml/min/mmHg
FEF 25-75 POST: 2.3 L/s
FEF 25-75 PRE: 1.62 L/s
FEF2575-%Change-Post: 42 %
FEF2575-%PRED-POST: 63 %
FEF2575-%PRED-PRE: 44 %
FEV1-%Change-Post: 8 %
FEV1-%PRED-POST: 68 %
FEV1-%Pred-Pre: 62 %
FEV1-POST: 2.69 L
FEV1-Pre: 2.47 L
FEV1FVC-%Change-Post: 2 %
FEV1FVC-%PRED-PRE: 90 %
FEV6-%CHANGE-POST: 6 %
FEV6-%PRED-POST: 74 %
FEV6-%Pred-Pre: 69 %
FEV6-POST: 3.63 L
FEV6-Pre: 3.4 L
FEV6FVC-%CHANGE-POST: 0 %
FEV6FVC-%PRED-POST: 102 %
FEV6FVC-%Pred-Pre: 101 %
FVC-%CHANGE-POST: 5 %
FVC-%Pred-Post: 72 %
FVC-%Pred-Pre: 68 %
FVC-Post: 3.66 L
FVC-Pre: 3.45 L
POST FEV1/FVC RATIO: 74 %
PRE FEV1/FVC RATIO: 72 %
Post FEV6/FVC ratio: 99 %
Pre FEV6/FVC Ratio: 99 %
RV % pred: 148 %
RV: 2.79 L
TLC % PRED: 95 %
TLC: 6.46 L

## 2016-01-05 MED ORDER — TIOTROPIUM BROMIDE MONOHYDRATE 2.5 MCG/ACT IN AERS: 2.0000 | INHALATION_SPRAY | Freq: Every day | RESPIRATORY_TRACT | 5 refills | Status: DC

## 2016-01-05 NOTE — Progress Notes (Signed)
pft  

## 2016-01-05 NOTE — Progress Notes (Signed)
Subjective:     Patient ID: Ian Grimes, male   DOB: August 02, 1970, 45 y.o.   MRN: [id number]  HPI   IOV 06/12/2013  Chief Complaint  Patient presents with  . Advice Only    Referred for pulmonary nodule.  Had CT chest at the end of March.  Has no breathing complaints at this time.    45 year old smoker with significant psychiatric history but a fairly good historian also with anxiety, hyperlipidemia and smoking. He was admitted 05/06/2013 with 3 week history of dyspnea. Was diagnosed with bilateral pulmonary embolism. Treated by the hospitalist service. He was on 2 L nasal cannula with a respirator 30 per minute at the time of admission. He was discharged 05/08/2013 on xarelto. Since then he has returned to baseline health. He is followed up with his primary care physician. He says that for the last 1 week he is not taking his xarelto because his primary care physician apparently told him that the refills are actually once a month. He is also confused that his primary care physician has been regarding that he is on Coumadin but he insists that he wants on xarelto up until one week ago and that there was no change in plan to take any other medication. He had tolerated xarelto well without any problems of bleeding or compliance issues.  CT scan of the chest at that time should 8 mm subpleural nodule in the lateral segment of the right middle lobe and that is the main reason for referral today  Other she smoking: He continues to smoke and is unable to quit.  reports that he has been smoking Cigarettes.  He has a 22 pack-year smoking history. He has never used smokeless tobacco.  IMPRESSION:   CT chest 05/06/13 Segmental pulmonary emboli within all lobes. Overall clot burden is  moderate.  8 x 6 mm triangular subpleural nodule in the lateral right middle  lobe, corresponding to the radiographic abnormality, favored to  reflect a benign subpleural lymph node but technically  indeterminate. Given  patient's high risk for primary bronchogenic  neoplasm, initial follow-up CT chest is suggested in 6 months.  Faint ground-glass nodular opacities in the left upper lobe/lingula,  possibly infectious/inflammatory.  Trace right pleural effusion.  Nodule recommendation follows the consensus statement: Guidelines  for Management of Small Pulmonary Nodules Detected on CT Scans: A  Statement from the [employer] as published in Radiology  2005; 237:395-400.  Critical value/emergent results were called by telephone at the time  of interpretation on 05/06/2013 at 9:12 AM to [facility], who  verbally acknowledged these results.  Electronically Signed  By: Julian Hy M.D.  On: 05/06/2013 09:35         OV 08/19/2013  Chief Complaint  Patient presents with  . Follow-up    Pt states breathing is unchanged. Pt states he only coughs to clear his throat. Pt states he constant sinus headaches. Pt denies SOB and CP.     Folllowup   Smoking: still smokes. Struggling to q uit. Depression makes it harder to quit  PE: back on xaretlo 20mg  per day after med review. He has no problems. MOwed yard 4h and no dyspnea   Lung nodule: has resolved June 2015 CT chest but new finding: Ascending Aorta Aneurumsm 4.5cm on CT chest   IMPRESSION:  1. No suspicious pulmonary nodules. Subpleural density previously  noted at the right lung base has resolved, likely atelectasis or  sequela of pulmonary embolism demonstrated on prior  examination.  2. Mild centrilobular and paraseptal emphysema.  3. Ascending aortic aneurysm appears mildly enlarged compared with  the prior study. Of note, previously demonstrated pulmonary embolism  is not addressed on this noncontrast study.  Electronically Signed  By: Camie Patience M.D.  On: 08/07/2013 12:30     OV 11/19/2014  Chief Complaint  Patient presents with  . Follow-up    Pt c/o mild DOE, prod cough with clear mucus. Pt deneis CP/tightness.      SMoking:  reports that he has been smoking Cigarettes.  He has a 22 pack-year smoking history. He has never used smokeless tobacco. CT scan of the chest in 2015 did show some subclinical emphysema  Pulmonary embolism: This was diagnosed in the spring of 2015. He completed one year Xarelto therapy earlier this year and I believe either under the instructions of primary care physician or cardiologist he came off therapy. He has since been referred here to follow-up. He feels fine without any chest pain hemoptysis syncope cough or dyspnea. The only amount of dyspnea he has this when he initially starts walking but as he warms up he does not have any dyspnea. His effort tolerance is good. Cardiologist wondered about hypercoagulable workup.     OV 06/01/2015  Chief Complaint  Patient presents with  . Follow-up    Pt here for 6 months f/u. Pt states his breathing is unchanged since last OV. Pt c/o prod cough with clear thin mucus and chest tightness at random times.     45 year old male  0- has subclinical emphysema: CT chest June 2015 without any nodules. He reports ongoing shortness of breath with exertion for walking over a block on level ground. This is stable. Insidious onset for the last 3-4 years. Associated with being overweight. No  Pulmonary function tests this visit  - Alpha 1 genetic deficiency: Last visit I checked his alpha-1. His level was 76. Phenotype was  SZ and abnormal. Shed results with him again  - Pulmonary embolism: Completed one year of therapy summer 2016 since then no clinical evidence of pulmonary embolism or DVT. He continues on aspirin therapy. D-dimer at last visit was normal suggesting recurrence risk is low  - Smoking: He continues to smoke. He knows he needs to quit but is finding it difficult  Past medical history: He has chronic headaches. He canceled an MRI due to concern of prosthesis and acyanotic. He'll be with his neurologist again    has a past  medical history of Schizo-affective psychosis ([place]); Anxiety; Depression; HA (headache); Hyperlipidemia; Pulmonary emboli ([place]); Pulmonary nodule; Pleural effusion; and echocardiogram.   reports that he has been smoking Cigarettes.  He has a 22 pack-year smoking history. He has never used smokeless tobacco.  OV 01/05/2016  Chief Complaint  Patient presents with  . Follow-up    Pt here after PFT. Pt denies changes in SOB since last OV. Pt c/o prod cough with clear mucus. Pt states he is getting over a cold. Pt denies CP/tightness and f/c/s.     45 year old male with smoking history, emphysema and history of pulmonary embolism status post completion of anticoagulation. This in the background setting of schizoaffective disorder and taking multiple psychiatric drugs  Emphysema associated with alpha-1: He says his breathing is stable. He has class II dyspnea and exertion and he notices this when walking up a steep incline. It is relieved by rest. This no associated chest pain. The cough is associated as mild and dry. No diaphoresis or  palpitations. Overall symptoms are stable compared to 2 years ago. However he did have pulmonary function test that is documented below and shows decline both in the prebronchodilator elements and the post bronchodilator elements and diffusion capacity. His alpha-1 deficiency. He is aware of this. I'm not so sure that he has to talk to his family members about it.  Smoking history: He continues to smoke. He says it helps on his notes. He says it'll be very difficult to quit. His psychiatrist is Dr. Sima Matas in [place]. He says he willing to take his help in trying to quit smoking.  Pulmonary Embolism: Last taken anticoagulation agent months ago. D-dimer was normal. He is on daily aspirin therapy. He says he is compliant with aspirin. No symptoms of recurrence.     Results for Ian Grimes, Ian Grimes (MRN [id number]) as of 01/05/2016 10:54  Ref. Range 11/12/2013 14:38  01/05/2016 09:13  FVC-Pre Latest Units: L 3.68 3.45  FVC-%Pred-Pre Latest Units: % 72 68  FEV1-Pre Latest Units: L 2.68 2.47  FEV1-%Pred-Pre Latest Units: % 66 62  Pre FEV1/FVC ratio Latest Units: % 73 72  FEV1FVC-%Pred-Pre Latest Units: % 92 90  FEF 25-75 Pre Latest Units: L/sec 1.86 1.62  FEF2575-%Pred-Pre Latest Units: % 50 44  FEV6-Pre Latest Units: L 3.63 3.40  FEV6-%Pred-Pre Latest Units: % 73 69  Pre FEV6/FVC Ratio Latest Units: % 99 99  FEV6FVC-%Pred-Pre Latest Units: % 101 101  FVC-Post Latest Units: L 4.08 3.66  FVC-%Pred-Post Latest Units: % 80 72  FVC-%Change-Post Latest Units: % 10 5  FEV1-Post Latest Units: L 3.16 2.69  FEV1-%Pred-Post Latest Units: % 78 68  FEV1-%Change-Post Latest Units: % 17 8  Post FEV1/FVC ratio Latest Units: % 77 74  FEV1FVC-%Change-Post Latest Units: % 6 2  FEF 25-75 Post Latest Units: L/sec 3.36 2.30  FEF2575-%Pred-Post Latest Units: % 90 63  FEF2575-%Change-Post Latest Units: % 80 42  FEV6-Post Latest Units: L 4.05 3.63  FEV6-%Pred-Post Latest Units: % 82 74  FEV6-%Change-Post Latest Units: % 11 6  Post FEV6/FVC ratio Latest Units: % 100 99  FEV6FVC-%Pred-Post Latest Units: % 102 102  FEV6FVC-%Change-Post Latest Units: % 1 0  TLC Latest Units: L  6.46  TLC % pred Latest Units: %  95  RV Latest Units: L  2.79  RV % pred Latest Units: %  148  DLCO cor Latest Units: ml/min/mmHg 25.19 22.89  DLCO cor % pred Latest Units: % 81 73  DLCO unc Latest Units: ml/min/mmHg 26.56 22.42  DLCO unc % pred Latest Units: % 85 72  DL/VA Latest Units: ml/min/mmHg/L 4.30 4.30  DL/VA % pred Latest Units: % 93 93       has a past medical history of Anxiety; Depression; HA (headache); echocardiogram; Hyperlipidemia; Pleural effusion; Pulmonary emboli ([place]); Pulmonary nodule; and Schizo-affective psychosis ([place]).   reports that he has been smoking Cigarettes.  He has a 22.00 pack-year smoking history. He has never used smokeless tobacco.  Past Surgical  History:  Procedure Laterality Date  . ASCENDING AORTIC ROOT REPLACEMENT N/A 11/14/2013   Procedure: Value sparing Root replacement, ASCENDING AORTIC ROOT REPLACEMENT, circ Arrest;  Surgeon: Gaye Pollack, MD;  Location: [place];  Service: Open Heart Surgery;  Laterality: N/A;  . CARDIAC CATHETERIZATION    . INTRAOPERATIVE TRANSESOPHAGEAL ECHOCARDIOGRAM N/A 11/14/2013   Procedure: INTRAOPERATIVE TRANSESOPHAGEAL ECHOCARDIOGRAM;  Surgeon: Gaye Pollack, MD;  Location[facility];  Service: Open Heart Surgery;  Laterality: N/A;  . LEFT AND RIGHT HEART CATHETERIZATION WITH CORONARY ANGIOGRAM  N/A 11/01/2013   Procedure: LEFT AND RIGHT HEART CATHETERIZATION WITH CORONARY ANGIOGRAM;  Surgeon: Josue Hector, MD;  Location: [facility] CATH LAB;  Service: Cardiovascular;  Laterality: N/A;  . rib injury      Allergies  Allergen Reactions  . Bactrim Rash     There is no immunization history on file for this patient.  Family History  Problem Relation Age of Onset  . Heart disease Father   . Stroke Father   . Cancer Father     lung  . Heart attack Father   . COPD Father   . Alcohol abuse Father   . Diabetes Paternal Grandfather   . COPD Paternal 72   . Asthma Paternal Aunt   . Stroke Paternal Uncle      Current Outpatient Prescriptions:  .  aspirin 81 MG tablet, Take 81 mg by mouth daily., Disp: , Rfl:  .  ezetimibe (ZETIA) 10 MG tablet, Take 1 tablet (10 mg total) by mouth daily., Disp: 90 tablet, Rfl: 1 .  hydrOXYzine (ATARAX/VISTARIL) 25 MG tablet, Take 1 tablet (25 mg total) by mouth 3 (three) times daily as needed., Disp: 90 tablet, Rfl: 2 .  ibuprofen (ADVIL,MOTRIN) 200 MG tablet, Take by mouth daily., Disp: , Rfl:  .  lurasidone (LATUDA) 40 MG TABS tablet, Take 1 tablet (40 mg total) by mouth daily with supper., Disp: 30 tablet, Rfl: 2 .  metoprolol tartrate (LOPRESSOR) 25 MG tablet, TAKE (1) TABLET TWICE DAILY., Disp: 60 tablet, Rfl: 4 .  Oxcarbazepine (TRILEPTAL) 300 MG tablet, Take 1 tablet  (300 mg total) by mouth 3 (three) times daily., Disp: 90 tablet, Rfl: 2 .  rosuvastatin (CRESTOR) 20 MG tablet, Take 1 tablet (20 mg total) by mouth daily., Disp: 90 tablet, Rfl: 3 .  sertraline (ZOLOFT) 100 MG tablet, Take 1 tablet (100 mg total) by mouth daily., Disp: 30 tablet, Rfl: 2 .  traZODone (DESYREL) 100 MG tablet, Take 1 tablet (100 mg total) by mouth at bedtime., Disp: 30 tablet, Rfl: 2   Review of Systems     Objective:   Physical Exam  Constitutional: He is oriented to person, place, and time. He appears well-developed and well-nourished. No distress.  HENT:  Head: Normocephalic and atraumatic.  Right Ear: External ear normal.  Left Ear: External ear normal.  Mouth/Throat: Oropharynx is clear and moist. No oropharyngeal exudate.  Eyes: Conjunctivae and EOM are normal. Pupils are equal, round, and reactive to light. Right eye exhibits no discharge. Left eye exhibits no discharge. No scleral icterus.  Neck: Normal range of motion. Neck supple. No JVD present. No tracheal deviation present. No thyromegaly present.  Cardiovascular: Normal rate, regular rhythm and intact distal pulses.  Exam reveals no gallop and no friction rub.   No murmur heard. Pulmonary/Chest: Effort normal and breath sounds normal. No respiratory distress. He has no wheezes. He has no rales. He exhibits no tenderness.  Abdominal: Soft. Bowel sounds are normal. He exhibits no distension and no mass. There is no tenderness. There is no rebound and no guarding.  Musculoskeletal: Normal range of motion. He exhibits no edema or tenderness.  Lymphadenopathy:    He has no cervical adenopathy.  Neurological: He is alert and oriented to person, place, and time. He has normal reflexes. No cranial nerve deficit. Coordination normal.  Skin: Skin is warm and dry. No rash noted. He is not diaphoretic. No erythema. No pallor.  Tattoo +  Psychiatric: He has a normal mood and affect. His  behavior is normal. Judgment and  thought content normal.  Nursing note and vitals reviewed.    Vitals:   01/05/16 1041  BP: 118/74  Pulse: (!) 59  SpO2: 97%  Weight: 223 lb (101.2 kg)  Height: 5\' 9"  (1.753 m)    Estimated body mass index is 32.93 kg/m as calculated from the following:   Height as of this encounter: 5\' 9"  (1.753 m).   Weight as of this encounter: 223 lb (101.2 kg).      Assessment:       ICD-9-CM ICD-10-CM   1. Pulmonary emphysema, unspecified emphysema type ([place]) 492.8 J43.9   2. Alpha-1-antitrypsin deficiency ([place]) 273.4 E88.01   3. Smoking history V15.82 Z87.891   4. History of pulmonary embolism V12.55 Z86.711        Plan:     #blood clot history to lungs  = 46months  since blood thinner completion  - no evidence of blood clot recurrence - risk is low but not zero  - continue daily aspirin therapy - quit smoking  #smoking  -= Smoking puts you at risk  for lung cancer, blood clots, emphysema getting worse - Therefore please stop smoking as well as possible - Please visit Dr Sima Matas psychiatrist for help quitting smoking  # alpha 1 genetic deficiency with emphysema  - lung function Is worse than 2 years ago in 2017  - this is because of smoking and alpha 1 - quit smoking as soon as possible - make sure all your blood relatives get tested for alpha 1 disease - Start Spiriva respimat daily - in future after you show ability to quit smoking and take spiriva regularly we can consider alpha 1 replacment infusion  #Follow-up 3 months  = CAT score at followup  Dr. Brand Males, M.D., [facility].C.P Pulmonary and Critical Care Medicine Staff Physician [place] Pager: [phone], If no answer or between  15:00h - 7:00h: call [phone]  01/05/2016 11:05 AM

## 2016-01-05 NOTE — Patient Instructions (Addendum)
ICD-9-CM ICD-10-CM   1. History of pulmonary embolism V12.55 Z86.711   2. Pulmonary emphysema, unspecified emphysema type ([place]) 492.8 J43.9   3. Smoking history V15.82 Z72.0   4. Alpha-1-antitrypsin deficiency ([place]) 273.4 E88.01    #blood clot history to lungs  = 8months  since blood thinner completion  - no evidence of blood clot recurrence - risk is low but not zero  - continue daily aspirin therapy - quit smoking  #smoking  -= Smoking puts you at risk  for lung cancer, blood clots, emphysema getting worse - Therefore please stop smoking as well as possible - Please visit Dr Sima Matas psychiatrist for help quitting smoking  # alpha 1 genetic deficiency with emphysema  - lung function Is worse than 2 years ago in 2017  - this is because of smoking and alpha 1 - quit smoking as soon as possible - make sure all your blood relatives get tested for alpha 1 disease - Start Spiriva respimat daily - in future after you show ability to quit smoking and take spiriva regularly we can consider alpha 1 replacment infusion  #Follow-up 3 months  = CAT score at followup

## 2016-01-18 ENCOUNTER — Ambulatory Visit (HOSPITAL_COMMUNITY): Payer: Self-pay | Admitting: Psychiatry

## 2016-01-19 ENCOUNTER — Ambulatory Visit (HOSPITAL_COMMUNITY): Payer: Self-pay | Admitting: Psychology

## 2016-01-20 ENCOUNTER — Encounter (HOSPITAL_COMMUNITY): Payer: Self-pay | Admitting: Psychiatry

## 2016-01-20 ENCOUNTER — Ambulatory Visit (INDEPENDENT_AMBULATORY_CARE_PROVIDER_SITE_OTHER): Payer: Medicare Other | Admitting: Psychiatry

## 2016-01-20 VITALS — BP 128/85 | HR 90 | Ht 72.0 in | Wt 224.4 lb

## 2016-01-20 DIAGNOSIS — Z9889 Other specified postprocedural states: Secondary | ICD-10-CM

## 2016-01-20 DIAGNOSIS — Z823 Family history of stroke: Secondary | ICD-10-CM | POA: Diagnosis not present

## 2016-01-20 DIAGNOSIS — Z8249 Family history of ischemic heart disease and other diseases of the circulatory system: Secondary | ICD-10-CM

## 2016-01-20 DIAGNOSIS — F17210 Nicotine dependence, cigarettes, uncomplicated: Secondary | ICD-10-CM

## 2016-01-20 DIAGNOSIS — F250 Schizoaffective disorder, bipolar type: Secondary | ICD-10-CM | POA: Diagnosis not present

## 2016-01-20 DIAGNOSIS — Z801 Family history of malignant neoplasm of trachea, bronchus and lung: Secondary | ICD-10-CM

## 2016-01-20 DIAGNOSIS — Z825 Family history of asthma and other chronic lower respiratory diseases: Secondary | ICD-10-CM

## 2016-01-20 DIAGNOSIS — Z833 Family history of diabetes mellitus: Secondary | ICD-10-CM | POA: Diagnosis not present

## 2016-01-20 DIAGNOSIS — Z7982 Long term (current) use of aspirin: Secondary | ICD-10-CM | POA: Diagnosis not present

## 2016-01-20 DIAGNOSIS — Z79899 Other long term (current) drug therapy: Secondary | ICD-10-CM

## 2016-01-20 MED ORDER — HYDROXYZINE HCL 25 MG PO TABS: 25.0000 mg | ORAL_TABLET | Freq: Three times a day (TID) | ORAL | 2 refills | Status: DC | PRN

## 2016-01-20 MED ORDER — SERTRALINE HCL 100 MG PO TABS: 100.0000 mg | ORAL_TABLET | Freq: Every day | ORAL | 2 refills | Status: DC

## 2016-01-20 MED ORDER — LURASIDONE HCL 40 MG PO TABS: 40.0000 mg | ORAL_TABLET | Freq: Every day | ORAL | 2 refills | Status: DC

## 2016-01-20 MED ORDER — OXCARBAZEPINE 300 MG PO TABS: 300.0000 mg | ORAL_TABLET | Freq: Three times a day (TID) | ORAL | 2 refills | Status: DC
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
## 2016-01-26 ENCOUNTER — Ambulatory Visit (HOSPITAL_COMMUNITY): Payer: Medicare Other | Admitting: Psychology

## 2016-01-26 ENCOUNTER — Encounter (HOSPITAL_COMMUNITY): Payer: Self-pay

## 2016-02-09 ENCOUNTER — Other Ambulatory Visit (HOSPITAL_COMMUNITY): Payer: Self-pay | Admitting: Psychiatry

## 2016-03-22 ENCOUNTER — Ambulatory Visit (HOSPITAL_COMMUNITY): Payer: Self-pay | Admitting: Psychiatry

## 2016-03-31 ENCOUNTER — Ambulatory Visit (HOSPITAL_COMMUNITY): Payer: Self-pay | Admitting: Psychiatry

## 2016-03-31 ENCOUNTER — Encounter (HOSPITAL_COMMUNITY): Payer: Self-pay

## 2016-04-11 ENCOUNTER — Ambulatory Visit (INDEPENDENT_AMBULATORY_CARE_PROVIDER_SITE_OTHER): Payer: Medicare Other | Admitting: Internal Medicine

## 2016-04-11 ENCOUNTER — Encounter: Payer: Self-pay | Admitting: Internal Medicine

## 2016-04-11 VITALS — BP 102/72 | HR 65 | Ht 72.0 in | Wt 225.6 lb

## 2016-04-11 DIAGNOSIS — J439 Emphysema, unspecified: Secondary | ICD-10-CM

## 2016-04-11 DIAGNOSIS — Z86711 Personal history of pulmonary embolism: Secondary | ICD-10-CM

## 2016-04-11 DIAGNOSIS — Z87891 Personal history of nicotine dependence: Secondary | ICD-10-CM

## 2016-04-11 DIAGNOSIS — E8801 Alpha-1-antitrypsin deficiency: Secondary | ICD-10-CM | POA: Diagnosis not present

## 2016-04-11 NOTE — Patient Instructions (Addendum)
ICD-9-CM ICD-10-CM   1. History of pulmonary embolism V12.55 Z86.711   2. Pulmonary emphysema, unspecified emphysema type ([place]) 492.8 J43.9   3. Smoking history V15.82 Z72.0   4. Alpha-1-antitrypsin deficiency ([place]) 273.4 E88.01    #blood clot history to lungs  = no evidence of blood clot recurrence - risk is low but not zero  - continue daily aspirin therapy - quit smoking  #smoking  -= Smoking puts you at risk  for lung cancer, blood clots, emphysema getting worse - Therefore please stop smoking as well as possible - My office will call Dr Harrington Challenger to see if ok for [place] to safely prescribe chantix for you  # alpha 1 genetic deficiency with emphysema  - lung function Is worse than 2 years ago in nov-2017  - this is because of smoking and alpha 1 - quit smoking as soon as possible - continue Spiriva respimat daily - take patient informaton on replacement therapy for alpha 1   #Follow-up - 3 months Pre-bd spiro and post bd spiro. No lung volume or dlco - return in  3 months to see me or sooner

## 2016-04-11 NOTE — Progress Notes (Signed)
Subjective:     Patient ID: Ian Grimes, male   DOB: 03-25-1970, 46 y.o.   MRN: [id number]  HPI     IOV 06/12/2013  Chief Complaint  Patient presents with  . Advice Only    Referred for pulmonary nodule.  Had CT chest at the end of March.  Has no breathing complaints at this time.    46 year old smoker with significant psychiatric history but a fairly good historian also with anxiety, hyperlipidemia and smoking. He was admitted 05/06/2013 with 3 week history of dyspnea. Was diagnosed with bilateral pulmonary embolism. Treated by the hospitalist service. He was on 2 L nasal cannula with a respirator 30 per minute at the time of admission. He was discharged 05/08/2013 on xarelto. Since then he has returned to baseline health. He is followed up with his primary care physician. He says that for the last 1 week he is not taking his xarelto because his primary care physician apparently told him that the refills are actually once a month. He is also confused that his primary care physician has been regarding that he is on Coumadin but he insists that he wants on xarelto up until one week ago and that there was no change in plan to take any other medication. He had tolerated xarelto well without any problems of bleeding or compliance issues.  CT scan of the chest at that time should 8 mm subpleural nodule in the lateral segment of the right middle lobe and that is the main reason for referral today  Other she smoking: He continues to smoke and is unable to quit.  reports that he has been smoking Cigarettes.  He has a 22 pack-year smoking history. He has never used smokeless tobacco.  IMPRESSION:   CT chest 05/06/13 Segmental pulmonary emboli within all lobes. Overall clot burden is  moderate.  8 x 6 mm triangular subpleural nodule in the lateral right middle  lobe, corresponding to the radiographic abnormality, favored to  reflect a benign subpleural lymph node but technically  indeterminate.  Given patient's high risk for primary bronchogenic  neoplasm, initial follow-up CT chest is suggested in 6 months.  Faint ground-glass nodular opacities in the left upper lobe/lingula,  possibly infectious/inflammatory.  Trace right pleural effusion.  Nodule recommendation follows the consensus statement: Guidelines  for Management of Small Pulmonary Nodules Detected on CT Scans: A  Statement from the [place] as published in Radiology  2005; 237:395-400.  Critical value/emergent results were called by telephone at the time  of interpretation on 05/06/2013 at 9:12 AM to [facility], who  verbally acknowledged these results.  Electronically Signed  By: Julian Hy M.D.  On: 05/06/2013 09:35         OV 08/19/2013  Chief Complaint  Patient presents with  . Follow-up    Pt states breathing is unchanged. Pt states he only coughs to clear his throat. Pt states he constant sinus headaches. Pt denies SOB and CP.     Folllowup   Smoking: still smokes. Struggling to q uit. Depression makes it harder to quit  PE: back on xaretlo 21m per day after med review. He has no problems. MOwed yard 4h and no dyspnea   Lung nodule: has resolved June 2015 CT chest but new finding: Ascending Aorta Aneurumsm 4.5cm on CT chest   IMPRESSION:  1. No suspicious pulmonary nodules. Subpleural density previously  noted at the right lung base has resolved, likely atelectasis or  sequela of pulmonary embolism demonstrated  on prior examination.  2. Mild centrilobular and paraseptal emphysema.  3. Ascending aortic aneurysm appears mildly enlarged compared with  the prior study. Of note, previously demonstrated pulmonary embolism  is not addressed on this noncontrast study.  Electronically Signed  By: Camie Patience M.D.  On: 08/07/2013 12:30     OV 11/19/2014  Chief Complaint  Patient presents with  . Follow-up    Pt c/o mild DOE, prod cough with clear mucus. Pt deneis CP/tightness.      SMoking:  reports that he has been smoking Cigarettes.  He has a 22 pack-year smoking history. He has never used smokeless tobacco. CT scan of the chest in 2015 did show some subclinical emphysema  Pulmonary embolism: This was diagnosed in the spring of 2015. He completed one year Xarelto therapy earlier this year and I believe either under the instructions of primary care physician or cardiologist he came off therapy. He has since been referred here to follow-up. He feels fine without any chest pain hemoptysis syncope cough or dyspnea. The only amount of dyspnea he has this when he initially starts walking but as he warms up he does not have any dyspnea. His effort tolerance is good. Cardiologist wondered about hypercoagulable workup.     OV 06/01/2015  Chief Complaint  Patient presents with  . Follow-up    Pt here for 6 months f/u. Pt states his breathing is unchanged since last OV. Pt c/o prod cough with clear thin mucus and chest tightness at random times.     46 year old male  0- has subclinical emphysema: CT chest June 2015 without any nodules. He reports ongoing shortness of breath with exertion for walking over a block on level ground. This is stable. Insidious onset for the last 3-4 years. Associated with being overweight. No  Pulmonary function tests this visit  - Alpha 1 genetic deficiency: Last visit I checked his alpha-1. His level was 76. Phenotype was  SZ and abnormal. Shed results with him again  - Pulmonary embolism: Completed one year of therapy summer 2016 since then no clinical evidence of pulmonary embolism or DVT. He continues on aspirin therapy. D-dimer at last visit was normal suggesting recurrence risk is low  - Smoking: He continues to smoke. He knows he needs to quit but is finding it difficult  Past medical history: He has chronic headaches. He canceled an MRI due to concern of prosthesis and acyanotic. He'll be with his neurologist again    has a past  medical history of Schizo-affective psychosis ([name]); Anxiety; Depression; HA (headache); Hyperlipidemia; Pulmonary emboli ([name]); Pulmonary nodule; Pleural effusion; and echocardiogram.   reports that he has been smoking Cigarettes.  He has a 22 pack-year smoking history. He has never used smokeless tobacco.  OV 01/05/2016  Chief Complaint  Patient presents with  . Follow-up    Pt here after PFT. Pt denies changes in SOB since last OV. Pt c/o prod cough with clear mucus. Pt states he is getting over a cold. Pt denies CP/tightness and f/c/s.     46 year old male with smoking history, emphysema and history of pulmonary embolism status post completion of anticoagulation. This in the background setting of schizoaffective disorder and taking multiple psychiatric drugs  Emphysema associated with alpha-1 SZ: He says his breathing is stable. He has class II dyspnea and exertion and he notices this when walking up a steep incline. It is relieved by rest. This no associated chest pain. The cough is associated as mild and dry.  No diaphoresis or palpitations. Overall symptoms are stable compared to 2 years ago. However he did have pulmonary function test that is documented below and shows decline both in the prebronchodilator elements and the post bronchodilator elements and diffusion capacity. His alpha-1 deficiency. He is aware of this. I'm not so sure that he has to talk to his family members about it.  Smoking history: He continues to smoke. He says it helps on his notes. He says it'll be very difficult to quit. His psychiatrist is Dr. Sima Matas in [place]. He says he willing to take his help in trying to quit smoking.  Pulmonary Embolism: Last taken anticoagulation agent months ago. D-dimer was normal. He is on daily aspirin therapy. He says he is compliant with aspirin. No symptoms of recurrence.   OV 04/11/2016  Chief Complaint  Patient presents with  . Follow-up    Pt. states he feels like his  breathing has remained unchanged,He is still coughing but it has been better  But he still has sinus headaches, Denies chest tightness,fever   Follow-up  History of pulmonary embolism: He is on baby aspirin prophylaxis. He says he is compliant with it. No clinical evidence of recurrence based on history this visit  Alpha-1 antitrypsin deficiency SZ with low levels with emphysema: He is taking Spiriva . He says he spoke to his aunt about his alpha-1 and he says the whole family knows about the alpha-1 deficiency but they failed to inform him. He is unsure whether he should take replacement and he wants to read some information about it. However he has not quit smoking. Overall COPD stable with a cat score of 17   smoking history: He continues to smoke. He is unable to quit. Now he tells me that his psychiatrist is actually Dr. Harrington Challenger and when he met with her in [place] she said that she would not be the right MD  to help him with quitting smoking.  He understands the need to quit     CAT COPD Symptom & Quality of Life Score ([place]) 0 is no burden. 5 is highest burden 04/11/2016   Never Cough -> Cough all the time 2  No phlegm in chest -> Chest is full of phlegm 3  No chest tightness -> Chest feels very tight 1  No dyspnea for 1 flight stairs/hill -> Very dyspneic for 1 flight of stairs 3  No limitations for ADL at home -> Very limited with ADL at home 0  Confident leaving home -> Not at all confident leaving home 5  Sleep soundly -> Do not sleep soundly because of lung condition 2  Lots of Energy -> No energy at all 1  TOTAL Score (max 40)  17         has a past medical history of Anxiety; Depression; HA (headache); echocardiogram; Hyperlipidemia; Pleural effusion; Pulmonary emboli ([place]); Pulmonary nodule; and Schizo-affective psychosis ([place]).   reports that he has been smoking Cigarettes.  He has a 22.00 pack-year smoking history. He has never used smokeless tobacco.  Past  Surgical History:  Procedure Laterality Date  . ASCENDING AORTIC ROOT REPLACEMENT N/A 11/14/2013   Procedure: Value sparing Root replacement, ASCENDING AORTIC ROOT REPLACEMENT, circ Arrest;  Surgeon: Gaye Pollack, MD;  Location: [facility];  Service: Open Heart Surgery;  Laterality: N/A;  . CARDIAC CATHETERIZATION    . INTRAOPERATIVE TRANSESOPHAGEAL ECHOCARDIOGRAM N/A 11/14/2013   Procedure: INTRAOPERATIVE TRANSESOPHAGEAL ECHOCARDIOGRAM;  Surgeon: Gaye Pollack, MD;  Location: [facility];  Service: Open Heart Surgery;  Laterality: N/A;  . LEFT AND RIGHT HEART CATHETERIZATION WITH CORONARY ANGIOGRAM N/A 11/01/2013   Procedure: LEFT AND RIGHT HEART CATHETERIZATION WITH CORONARY ANGIOGRAM;  Surgeon: Josue Hector, MD;  [facility];  Service: Cardiovascular;  Laterality: N/A;  . rib injury      Allergies  Allergen Reactions  . Bactrim Rash     There is no immunization history on file for this patient.  Family History  Problem Relation Age of Onset  . Heart disease Father   . Stroke Father   . Cancer Father     lung  . Heart attack Father   . COPD Father   . Alcohol abuse Father   . Diabetes Paternal Grandfather   . COPD Paternal 10   . Asthma Paternal Aunt   . Stroke Paternal Uncle      Current Outpatient Prescriptions:  .  aspirin 81 MG tablet, Take 81 mg by mouth daily., Disp: , Rfl:  .  ezetimibe (ZETIA) 10 MG tablet, Take 1 tablet (10 mg total) by mouth daily., Disp: 90 tablet, Rfl: 1 .  hydrOXYzine (ATARAX/VISTARIL) 25 MG tablet, Take 1 tablet (25 mg total) by mouth 3 (three) times daily as needed., Disp: 90 tablet, Rfl: 2 .  ibuprofen (ADVIL,MOTRIN) 200 MG tablet, Take by mouth daily., Disp: , Rfl:  .  metoprolol tartrate (LOPRESSOR) 25 MG tablet, TAKE (1) TABLET TWICE DAILY., Disp: 60 tablet, Rfl: 4 .  Oxcarbazepine (TRILEPTAL) 300 MG tablet, Take 1 tablet (300 mg total) by mouth 3 (three) times daily., Disp: 90 tablet, Rfl: 2 .  rosuvastatin (CRESTOR) 20 MG tablet,  Take 1 tablet (20 mg total) by mouth daily., Disp: 90 tablet, Rfl: 3 .  sertraline (ZOLOFT) 100 MG tablet, Take 1 tablet (100 mg total) by mouth daily., Disp: 30 tablet, Rfl: 2 .  Tiotropium Bromide Monohydrate (SPIRIVA RESPIMAT) 2.5 MCG/ACT AERS, Inhale 2 puffs into the lungs daily., Disp: 1 Inhaler, Rfl: 5 .  traZODone (DESYREL) 100 MG tablet, Take 1 tablet (100 mg total) by mouth at bedtime., Disp: 30 tablet, Rfl: 2 .  lurasidone (LATUDA) 40 MG TABS tablet, Take 1 tablet (40 mg total) by mouth daily with supper. (Patient not taking: Reported on 04/11/2016), Disp: 30 tablet, Rfl: 2   Review of Systems     Objective:   Physical Exam  Constitutional: He is oriented to person, place, and time. He appears well-developed and well-nourished. No distress.  HENT:  Head: Normocephalic and atraumatic.  Right Ear: External ear normal.  Left Ear: External ear normal.  Mouth/Throat: Oropharynx is clear and moist. No oropharyngeal exudate.  Smells of tobacco  Eyes: Conjunctivae and EOM are normal. Pupils are equal, round, and reactive to light. Right eye exhibits no discharge. Left eye exhibits no discharge. No scleral icterus.  Neck: Normal range of motion. Neck supple. No JVD present. No tracheal deviation present. No thyromegaly present.  Cardiovascular: Normal rate, regular rhythm and intact distal pulses.  Exam reveals no gallop and no friction rub.   No murmur heard. Pulmonary/Chest: Effort normal and breath sounds normal. No respiratory distress. He has no wheezes. He has no rales. He exhibits no tenderness.  Abdominal: Soft. Bowel sounds are normal. He exhibits no distension and no mass. There is no tenderness. There is no rebound and no guarding.  Musculoskeletal: Normal range of motion. He exhibits no edema or tenderness.  Lymphadenopathy:    He has no cervical adenopathy.  Neurological: He is  alert and oriented to person, place, and time. He has normal reflexes. No cranial nerve deficit.  Coordination normal.  Skin: Skin is warm and dry. No rash noted. He is not diaphoretic. No erythema. No pallor.  Psychiatric: He has a normal mood and affect. His behavior is normal. Judgment and thought content normal.  Nursing note and vitals reviewed.   Vitals:   04/11/16 1020  BP: 102/72  Pulse: 65  SpO2: 97%  Weight: 225 lb 9.6 oz (102.3 kg)  Height: 6' (1.829 m)    Estimated body mass index is 30.6 kg/m as calculated from the following:   Height as of this encounter: 6' (1.829 m).   Weight as of this encounter: 225 lb 9.6 oz (102.3 kg).      Assessment:       ICD-9-CM ICD-10-CM   1. Pulmonary emphysema, unspecified emphysema type ([place]) 492.8 J43.9   2. Alpha-1-antitrypsin deficiency ([place]) 273.4 E88.01   3. Smoking history V15.82 Z87.891   4. History of pulmonary embolism V12.55 Z86.711        Plan:      #blood clot history to lungs  = no evidence of blood clot recurrence - risk is low but not zero  - continue daily aspirin therapy - quit smoking  #smoking  -= Smoking puts you at risk  for lung cancer, blood clots, emphysema getting worse - Therefore please stop smoking as well as possible - My office will call Dr Harrington Challenger to see if ok for [place] to safely prescribe chantix for you  # alpha 1 genetic deficiency with emphysema  - lung function Is worse than 2 years ago in nov-2017  - this is because of smoking and alpha 1 - quit smoking as soon as possible - continue Spiriva respimat daily - take patient informaton on replacement therapy for alpha 1   #Follow-up - 3 months Pre-bd spiro and post bd spiro. No lung volume or dlco - return in  3 months to see me or sooner  Dr. Brand Males, M.D., [facility].C.P Pulmonary and Critical Care Medicine Staff Physician [place] [facility] Pager: [phone], If no answer or between  15:00h - 7:00h: call [phone]  04/11/2016 10:48 AM

## 2016-04-11 NOTE — Addendum Note (Signed)
Addended by: Collier Salina on: 04/11/2016 10:52 AM   Modules accepted: Orders

## 2016-04-11 NOTE — Addendum Note (Signed)
Addended by: Collier Salina on: 04/11/2016 01:12 PM   Modules accepted: Orders

## 2016-04-12 ENCOUNTER — Encounter (HOSPITAL_COMMUNITY): Payer: Self-pay | Admitting: Psychiatry

## 2016-04-12 ENCOUNTER — Ambulatory Visit (INDEPENDENT_AMBULATORY_CARE_PROVIDER_SITE_OTHER): Payer: Medicare Other | Admitting: Psychiatry

## 2016-04-12 VITALS — BP 115/70 | HR 61 | Ht 72.0 in | Wt 221.0 lb

## 2016-04-12 DIAGNOSIS — Z79899 Other long term (current) drug therapy: Secondary | ICD-10-CM

## 2016-04-12 DIAGNOSIS — Z801 Family history of malignant neoplasm of trachea, bronchus and lung: Secondary | ICD-10-CM | POA: Diagnosis not present

## 2016-04-12 DIAGNOSIS — Z823 Family history of stroke: Secondary | ICD-10-CM | POA: Diagnosis not present

## 2016-04-12 DIAGNOSIS — Z825 Family history of asthma and other chronic lower respiratory diseases: Secondary | ICD-10-CM

## 2016-04-12 DIAGNOSIS — F250 Schizoaffective disorder, bipolar type: Secondary | ICD-10-CM

## 2016-04-12 DIAGNOSIS — Z888 Allergy status to other drugs, medicaments and biological substances status: Secondary | ICD-10-CM | POA: Diagnosis not present

## 2016-04-12 DIAGNOSIS — F17210 Nicotine dependence, cigarettes, uncomplicated: Secondary | ICD-10-CM

## 2016-04-12 DIAGNOSIS — Z7982 Long term (current) use of aspirin: Secondary | ICD-10-CM | POA: Diagnosis not present

## 2016-04-12 DIAGNOSIS — Z9889 Other specified postprocedural states: Secondary | ICD-10-CM

## 2016-04-12 DIAGNOSIS — Z811 Family history of alcohol abuse and dependence: Secondary | ICD-10-CM | POA: Diagnosis not present

## 2016-04-12 DIAGNOSIS — Z8249 Family history of ischemic heart disease and other diseases of the circulatory system: Secondary | ICD-10-CM

## 2016-04-12 MED ORDER — TRAZODONE HCL 100 MG PO TABS: 100.0000 mg | ORAL_TABLET | Freq: Every day | ORAL | 2 refills | Status: DC

## 2016-04-12 MED ORDER — SERTRALINE HCL 100 MG PO TABS: 100.0000 mg | ORAL_TABLET | Freq: Every day | ORAL | 2 refills | Status: DC

## 2016-04-12 MED ORDER — HYDROXYZINE HCL 25 MG PO TABS: 25.0000 mg | ORAL_TABLET | Freq: Three times a day (TID) | ORAL | 2 refills | Status: DC | PRN

## 2016-04-12 MED ORDER — OXCARBAZEPINE 300 MG PO TABS: 300.0000 mg | ORAL_TABLET | Freq: Three times a day (TID) | ORAL | 2 refills | Status: DC
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
## 2016-04-29 ENCOUNTER — Other Ambulatory Visit (HOSPITAL_COMMUNITY): Payer: Self-pay | Admitting: Psychiatry

## 2016-04-29 DIAGNOSIS — R110 Nausea: Secondary | ICD-10-CM | POA: Diagnosis not present

## 2016-04-29 DIAGNOSIS — F419 Anxiety disorder, unspecified: Secondary | ICD-10-CM | POA: Diagnosis not present

## 2016-04-29 DIAGNOSIS — Z79899 Other long term (current) drug therapy: Secondary | ICD-10-CM | POA: Diagnosis not present

## 2016-04-29 DIAGNOSIS — F17200 Nicotine dependence, unspecified, uncomplicated: Secondary | ICD-10-CM | POA: Diagnosis not present

## 2016-04-29 DIAGNOSIS — Z7982 Long term (current) use of aspirin: Secondary | ICD-10-CM | POA: Diagnosis not present

## 2016-05-06 ENCOUNTER — Other Ambulatory Visit: Payer: Self-pay | Admitting: Family Medicine

## 2016-05-06 ENCOUNTER — Telehealth: Payer: Self-pay | Admitting: Family Medicine

## 2016-05-06 MED ORDER — PRAVASTATIN SODIUM 40 MG PO TABS: 40.0000 mg | ORAL_TABLET | Freq: Every day | ORAL | 3 refills | Status: DC

## 2016-05-06 NOTE — Telephone Encounter (Signed)
I sent in the requested prescription 

## 2016-05-06 NOTE — Telephone Encounter (Signed)
Per pt insurance will not cover Crestor and pt cannot afford Insurance will cover Pravastatin or Lovastatin Please advise

## 2016-05-09 NOTE — Telephone Encounter (Signed)
LMOVM Rx sent to pharmacy 

## 2016-05-11 ENCOUNTER — Other Ambulatory Visit: Payer: Self-pay | Admitting: Family Medicine

## 2016-06-01 ENCOUNTER — Telehealth: Payer: Self-pay | Admitting: Family Medicine

## 2016-06-03 ENCOUNTER — Encounter: Payer: Self-pay | Admitting: Family Medicine

## 2016-06-03 ENCOUNTER — Ambulatory Visit (INDEPENDENT_AMBULATORY_CARE_PROVIDER_SITE_OTHER): Payer: Medicare Other | Admitting: Family Medicine

## 2016-06-03 VITALS — BP 116/66 | HR 57 | Temp 98.0°F | Ht 72.0 in | Wt 224.0 lb

## 2016-06-03 DIAGNOSIS — I952 Hypotension due to drugs: Secondary | ICD-10-CM | POA: Diagnosis not present

## 2016-06-03 MED ORDER — METOPROLOL TARTRATE 25 MG PO TABS: 12.5000 mg | ORAL_TABLET | Freq: Two times a day (BID) | ORAL | 4 refills | Status: DC

## 2016-06-03 NOTE — Progress Notes (Signed)
Subjective:  Patient ID: Ian Grimes, male    DOB: 1970-12-02  Age: 46 y.o. MRN: [id number]  CC: Blood Pressure Check (pt here today c/o an episode of BP 90/60 and pulse was 32 at a local [employer]. He had been nauseated.)   HPI Ian Grimes presents for  follow-up of hypertension. Patient has no history of headache chest pain or shortness of breath or recent cough. Patient also denies symptoms of TIA such as numbness weakness lateralizing. Patient checks  blood pressure at home and has not had any elevated readings recently. However low reading noted above. This is associated with nausea. One episode occurred 2 weeks ago. This was self-limited. Patient denies any other side effects from his medication. States taking it regularly.  Patient in for follow-up of elevated cholesterol. Doing well without complaints on current medication. Denies side effects of statin including myalgia and arthralgia and nausea. Also in today for liver function testing. Currently no chest pain, shortness of breath or other cardiovascular related symptoms noted.   History Clever has a past medical history of Anxiety; Depression; HA (headache); echocardiogram; Hyperlipidemia; Pleural effusion; Pulmonary emboli ([place]); Pulmonary nodule; and Schizo-affective psychosis ([place]).   He has a past surgical history that includes rib injury; Cardiac catheterization; Intraoprative transesophageal echocardiogram (N/A, 11/14/2013); Ascending aortic root replacement (N/A, 11/14/2013); and left and right heart catheterization with coronary angiogram (N/A, 11/01/2013).   His family history includes Alcohol abuse in his father; Asthma in his paternal aunt; COPD in his father and paternal aunt; Cancer in his father; Diabetes in his paternal grandfather; Heart attack in his father; Heart disease in his father; Stroke in his father and paternal uncle.He reports that he has been smoking Cigarettes.  He has a 22.00 pack-year smoking history. He  has never used smokeless tobacco. He reports that he does not drink alcohol or use drugs.  Current Outpatient Prescriptions on File Prior to Visit  Medication Sig Dispense Refill  . aspirin 81 MG tablet Take 81 mg by mouth daily.    Marland Kitchen ezetimibe (ZETIA) 10 MG tablet Take 1 tablet (10 mg total) by mouth daily. 90 tablet 1  . hydrOXYzine (ATARAX/VISTARIL) 25 MG tablet Take 1 tablet (25 mg total) by mouth 3 (three) times daily as needed. 90 tablet 2  . ibuprofen (ADVIL,MOTRIN) 200 MG tablet Take by mouth daily.    . Oxcarbazepine (TRILEPTAL) 300 MG tablet Take 1 tablet (300 mg total) by mouth 3 (three) times daily. 90 tablet 2  . pravastatin (PRAVACHOL) 40 MG tablet Take 1 tablet (40 mg total) by mouth daily. 90 tablet 3  . sertraline (ZOLOFT) 100 MG tablet Take 1 tablet (100 mg total) by mouth daily. 30 tablet 2  . traZODone (DESYREL) 100 MG tablet Take 1 tablet (100 mg total) by mouth at bedtime. 30 tablet 2  . Tiotropium Bromide Monohydrate (SPIRIVA RESPIMAT) 2.5 MCG/ACT AERS Inhale 2 puffs into the lungs daily. (Patient not taking: Reported on 06/03/2016) 1 Inhaler 5   No current facility-administered medications on file prior to visit.     ROS Review of Systems  Constitutional: Negative for chills, diaphoresis, fever and unexpected weight change.  HENT: Negative for congestion, hearing loss, rhinorrhea and sore throat.   Eyes: Negative for visual disturbance.  Respiratory: Negative for cough and shortness of breath.   Cardiovascular: Negative for chest pain.  Gastrointestinal: Negative for abdominal pain, constipation and diarrhea.  Genitourinary: Negative for dysuria and flank pain.  Musculoskeletal: Negative for arthralgias and joint  swelling.  Skin: Negative for rash.  Neurological: Negative for dizziness and headaches.  Psychiatric/Behavioral: Negative for dysphoric mood and sleep disturbance.    Objective:  BP 116/66   Pulse (!) 57   Temp 98 F (36.7 C) (Oral)   Ht 6' (1.829  m)   Wt 224 lb (101.6 kg)   BMI 30.38 kg/m   Physical Exam  Constitutional: He is oriented to person, place, and time. He appears well-developed and well-nourished. No distress.  HENT:  Head: Normocephalic and atraumatic.  Right Ear: External ear normal.  Left Ear: External ear normal.  Nose: Nose normal.  Mouth/Throat: Oropharynx is clear and moist.  Eyes: Conjunctivae and EOM are normal. Pupils are equal, round, and reactive to light.  Neck: Normal range of motion. Neck supple. No thyromegaly present.  Cardiovascular: Normal rate, regular rhythm and normal heart sounds.   No murmur heard. Pulmonary/Chest: Effort normal and breath sounds normal. No respiratory distress. He has no wheezes. He has no rales.  Abdominal: Soft. Bowel sounds are normal. He exhibits no distension. There is no tenderness.  Lymphadenopathy:    He has no cervical adenopathy.  Neurological: He is alert and oriented to person, place, and time. He has normal reflexes.  Skin: Skin is warm and dry.  Psychiatric: He has a normal mood and affect. His behavior is normal. Judgment and thought content normal.    Assessment & Plan:   Rishit was seen today for blood pressure check.  Diagnoses and all orders for this visit:  Hypotension due to medication  Other orders -     metoprolol tartrate (LOPRESSOR) 25 MG tablet; Take 0.5 tablets (12.5 mg total) by mouth 2 (two) times daily.   I have changed Mr. Fouche metoprolol tartrate. I am also having him maintain his aspirin, ezetimibe, ibuprofen, Tiotropium Bromide Monohydrate, sertraline, traZODone, Oxcarbazepine, hydrOXYzine, and pravastatin.  Meds ordered this encounter  Medications  . metoprolol tartrate (LOPRESSOR) 25 MG tablet    Sig: Take 0.5 tablets (12.5 mg total) by mouth 2 (two) times daily.    Dispense:  60 tablet    Refill:  4   Due to low pulse and pressure, his metoprolol dose was cut as noted above. Monitor blood pressure at home or drugstores  notify me if it does not stay above 100/60 and below 135/85.  Follow-up: Return in about 3 months (around 09/02/2016).  Claretta Fraise, M.D.

## 2016-07-01 ENCOUNTER — Ambulatory Visit (INDEPENDENT_AMBULATORY_CARE_PROVIDER_SITE_OTHER): Payer: Medicare Other | Admitting: Psychiatry

## 2016-07-01 ENCOUNTER — Encounter (HOSPITAL_COMMUNITY): Payer: Self-pay | Admitting: Psychiatry

## 2016-07-01 VITALS — BP 137/95 | HR 67 | Ht 72.0 in | Wt 218.0 lb

## 2016-07-01 DIAGNOSIS — F250 Schizoaffective disorder, bipolar type: Secondary | ICD-10-CM

## 2016-07-01 DIAGNOSIS — Z811 Family history of alcohol abuse and dependence: Secondary | ICD-10-CM

## 2016-07-01 DIAGNOSIS — Z79899 Other long term (current) drug therapy: Secondary | ICD-10-CM

## 2016-07-01 DIAGNOSIS — Z7982 Long term (current) use of aspirin: Secondary | ICD-10-CM | POA: Diagnosis not present

## 2016-07-01 DIAGNOSIS — F17210 Nicotine dependence, cigarettes, uncomplicated: Secondary | ICD-10-CM | POA: Diagnosis not present

## 2016-07-01 MED ORDER — OXCARBAZEPINE 300 MG PO TABS: 300.0000 mg | ORAL_TABLET | Freq: Three times a day (TID) | ORAL | 2 refills | Status: DC

## 2016-07-01 MED ORDER — SERTRALINE HCL 100 MG PO TABS: 100.0000 mg | ORAL_TABLET | Freq: Every day | ORAL | 2 refills | Status: DC

## 2016-07-01 MED ORDER — HYDROXYZINE HCL 25 MG PO TABS
25.0000 mg | ORAL_TABLET | Freq: Three times a day (TID) | ORAL | 2 refills | Status: DC | PRN
Start: 2016-07-01 — End: 2016-08-02

## 2016-07-01 MED ORDER — PRAZOSIN HCL 2 MG PO CAPS: 2.0000 mg | ORAL_CAPSULE | Freq: Every day | ORAL | 2 refills | Status: DC
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
## 2016-07-11 ENCOUNTER — Encounter: Payer: Self-pay | Admitting: Internal Medicine

## 2016-07-11 ENCOUNTER — Ambulatory Visit (INDEPENDENT_AMBULATORY_CARE_PROVIDER_SITE_OTHER): Payer: Medicare Other | Admitting: Internal Medicine

## 2016-07-11 DIAGNOSIS — Z720 Tobacco use: Secondary | ICD-10-CM

## 2016-07-11 DIAGNOSIS — E8801 Alpha-1-antitrypsin deficiency: Secondary | ICD-10-CM

## 2016-07-11 DIAGNOSIS — J439 Emphysema, unspecified: Secondary | ICD-10-CM

## 2016-07-11 DIAGNOSIS — Z86711 Personal history of pulmonary embolism: Secondary | ICD-10-CM | POA: Diagnosis not present

## 2016-07-11 DIAGNOSIS — J449 Chronic obstructive pulmonary disease, unspecified: Secondary | ICD-10-CM | POA: Diagnosis not present

## 2016-07-11 LAB — PULMONARY FUNCTION TEST
DL/VA % pred: 96 %
DL/VA: 4.44 ml/min/mmHg/L
DLCO COR: 24.4 ml/min/mmHg
DLCO UNC % PRED: 79 %
DLCO cor % pred: 78 %
DLCO unc: 24.74 ml/min/mmHg
FEF 25-75 PRE: 1.66 L/s
FEF2575-%PRED-PRE: 46 %
FEV1-%Pred-Pre: 66 %
FEV1-Pre: 2.61 L
FEV1FVC-%PRED-PRE: 88 %
FEV6-%PRED-PRE: 75 %
FEV6-PRE: 3.66 L
FEV6FVC-%Pred-Pre: 101 %
FVC-%PRED-PRE: 74 %
FVC-Pre: 3.72 L
PRE FEV6/FVC RATIO: 98 %
Pre FEV1/FVC ratio: 70 %

## 2016-07-11 MED ORDER — GLYCOPYRROLATE-FORMOTEROL 9-4.8 MCG/ACT IN AERO
2.0000 | INHALATION_SPRAY | Freq: Two times a day (BID) | RESPIRATORY_TRACT | 5 refills | Status: DC
Start: 2016-07-11 — End: 2017-01-17

## 2016-07-11 NOTE — Patient Instructions (Addendum)
Tobacco use Please continue to work on qutting smoking  Alpha-1-antitrypsin deficiency ([place]) Stable lung function No indication for infusion right now  History of pulmonary embolism Continue baby aspirin  Stage 2 moderate COPD by GOLD classification ([place]) Stable lung function  Plan Sample bevespi 2 puff twice daily scheduled Script for bevespi or incruse or tudorza and see what is cheaper and call us  Albuterol as needed   followup 6 months or sooner if needed  Followup 6 months or needed

## 2016-07-11 NOTE — Assessment & Plan Note (Signed)
Please continue to work on [place] smoking

## 2016-07-11 NOTE — Progress Notes (Signed)
PFT done today. 

## 2016-07-11 NOTE — Assessment & Plan Note (Addendum)
Stable lung function  Plan Sample bevespi 2 puff twice daily scheduled Script for bevespi or incruse or tudorza and see what is cheaper and call us  Albuterol as needed   followup 6 months or sooner if needed

## 2016-07-11 NOTE — Assessment & Plan Note (Signed)
Continue baby aspirin

## 2016-07-11 NOTE — Assessment & Plan Note (Signed)
Stable lung function No indication for infusion right now

## 2016-07-11 NOTE — Progress Notes (Signed)
Subjective:     Patient ID: Ian Grimes, male   DOB: 24-Jul-1970, 45 y.o.   MRN: [id number]  PCP Claretta Fraise, MD   HPI     IOV 06/12/2013  Chief Complaint  Patient presents with  . Advice Only    Referred for pulmonary nodule.  Had CT chest at the end of March.  Has no breathing complaints at this time.    46 year old smoker with significant psychiatric history but a fairly good historian also with anxiety, hyperlipidemia and smoking. He was admitted 05/06/2013 with 3 week history of dyspnea. Was diagnosed with bilateral pulmonary embolism. Treated by the hospitalist service. He was on 2 L nasal cannula with a respirator 30 per minute at the time of admission. He was discharged 05/08/2013 on xarelto. Since then he has returned to baseline health. He is followed up with his primary care physician. He says that for the last 1 week he is not taking his xarelto because his primary care physician apparently told him that the refills are actually once a month. He is also confused that his primary care physician has been regarding that he is on Coumadin but he insists that he wants on xarelto up until one week ago and that there was no change in plan to take any other medication. He had tolerated xarelto well without any problems of bleeding or compliance issues.  CT scan of the chest at that time should 8 mm subpleural nodule in the lateral segment of the right middle lobe and that is the main reason for referral today  Other she smoking: He continues to smoke and is unable to quit.  reports that he has been smoking Cigarettes.  He has a 22 pack-year smoking history. He has never used smokeless tobacco.  IMPRESSION:   CT chest 05/06/13 Segmental pulmonary emboli within all lobes. Overall clot burden is  moderate.  8 x 6 mm triangular subpleural nodule in the lateral right middle  lobe, corresponding to the radiographic abnormality, favored to  reflect a benign subpleural lymph node but  technically  indeterminate. Given patient's high risk for primary bronchogenic  neoplasm, initial follow-up CT chest is suggested in 6 months.  Faint ground-glass nodular opacities in the left upper lobe/lingula,  possibly infectious/inflammatory.  Trace right pleural effusion.  Nodule recommendation follows the consensus statement: Guidelines  for Management of Small Pulmonary Nodules Detected on CT Scans: A  Statement from the [place] as published in Radiology  2005; 237:395-400.  Critical value/emergent results were called by telephone at the time  of interpretation on 05/06/2013 at 9:12 AM to [facility], who  verbally acknowledged these results.  Electronically Signed  By: Julian Hy M.D.  On: 05/06/2013 09:35         OV 08/19/2013  Chief Complaint  Patient presents with  . Follow-up    Pt states breathing is unchanged. Pt states he only coughs to clear his throat. Pt states he constant sinus headaches. Pt denies SOB and CP.     Folllowup   Smoking: still smokes. Struggling to q uit. Depression makes it harder to quit  PE: back on xaretlo 9m per day after med review. He has no problems. MOwed yard 4h and no dyspnea   Lung nodule: has resolved June 2015 CT chest but new finding: Ascending Aorta Aneurumsm 4.5cm on CT chest   IMPRESSION:  1. No suspicious pulmonary nodules. Subpleural density previously  noted at the right lung base has resolved, likely atelectasis or  sequela of pulmonary embolism demonstrated on prior examination.  2. Mild centrilobular and paraseptal emphysema.  3. Ascending aortic aneurysm appears mildly enlarged compared with  the prior study. Of note, previously demonstrated pulmonary embolism  is not addressed on this noncontrast study.  Electronically Signed  By: Camie Patience M.D.  On: 08/07/2013 12:30     OV 11/19/2014  Chief Complaint  Patient presents with  . Follow-up    Pt c/o mild DOE, prod cough with clear  mucus. Pt deneis CP/tightness.     SMoking:  reports that he has been smoking Cigarettes.  He has a 22 pack-year smoking history. He has never used smokeless tobacco. CT scan of the chest in 2015 did show some subclinical emphysema  Pulmonary embolism: This was diagnosed in the spring of 2015. He completed one year Xarelto therapy earlier this year and I believe either under the instructions of primary care physician or cardiologist he came off therapy. He has since been referred here to follow-up. He feels fine without any chest pain hemoptysis syncope cough or dyspnea. The only amount of dyspnea he has this when he initially starts walking but as he warms up he does not have any dyspnea. His effort tolerance is good. Cardiologist wondered about hypercoagulable workup.     OV 06/01/2015  Chief Complaint  Patient presents with  . Follow-up    Pt here for 6 months f/u. Pt states his breathing is unchanged since last OV. Pt c/o prod cough with clear thin mucus and chest tightness at random times.     46 year old male  0- has subclinical emphysema: CT chest June 2015 without any nodules. He reports ongoing shortness of breath with exertion for walking over a block on level ground. This is stable. Insidious onset for the last 3-4 years. Associated with being overweight. No  Pulmonary function tests this visit  - Alpha 1 genetic deficiency: Last visit I checked his alpha-1. His level was 76. Phenotype was  SZ and abnormal. Shed results with him again  - Pulmonary embolism: Completed one year of therapy summer 2016 since then no clinical evidence of pulmonary embolism or DVT. He continues on aspirin therapy. D-dimer at last visit was normal suggesting recurrence risk is low  - Smoking: He continues to smoke. He knows he needs to quit but is finding it difficult  Past medical history: He has chronic headaches. He canceled an MRI due to concern of prosthesis and acyanotic. He'll be with his  neurologist again    has a past medical history of Schizo-affective psychosis ([place]); Anxiety; Depression; HA (headache); Hyperlipidemia; Pulmonary emboli ([place]); Pulmonary nodule; Pleural effusion; and echocardiogram.   reports that he has been smoking Cigarettes.  He has a 22 pack-year smoking history. He has never used smokeless tobacco.  OV 01/05/2016  Chief Complaint  Patient presents with  . Follow-up    Pt here after PFT. Pt denies changes in SOB since last OV. Pt c/o prod cough with clear mucus. Pt states he is getting over a cold. Pt denies CP/tightness and f/c/s.     46 year old male with smoking history, emphysema and history of pulmonary embolism status post completion of anticoagulation. This in the background setting of schizoaffective disorder and taking multiple psychiatric drugs  Emphysema associated with alpha-1 SZ: He says his breathing is stable. He has class II dyspnea and exertion and he notices this when walking up a steep incline. It is relieved by rest. This no associated chest pain. The cough is  associated as mild and dry. No diaphoresis or palpitations. Overall symptoms are stable compared to 2 years ago. However he did have pulmonary function test that is documented below and shows decline both in the prebronchodilator elements and the post bronchodilator elements and diffusion capacity. His alpha-1 deficiency. He is aware of this. I'm not so sure that he has to talk to his family members about it.  Smoking history: He continues to smoke. He says it helps on his notes. He says it'll be very difficult to quit. His psychiatrist is Dr. Sima Matas in [place]. He says he willing to take his help in trying to quit smoking.  Pulmonary Embolism: Last taken anticoagulation agent months ago. D-dimer was normal. He is on daily aspirin therapy. He says he is compliant with aspirin. No symptoms of recurrence.   OV 04/11/2016  Chief Complaint  Patient presents with  .  Follow-up    Pt. states he feels like his breathing has remained unchanged,He is still coughing but it has been better  But he still has sinus headaches, Denies chest tightness,fever   Follow-up  History of pulmonary embolism: He is on baby aspirin prophylaxis. He says he is compliant with it. No clinical evidence of recurrence based on history this visit  Alpha-1 antitrypsin deficiency SZ with low levels with emphysema: He is taking Spiriva . He says he spoke to his aunt about his alpha-1 and he says the whole family knows about the alpha-1 deficiency but they failed to inform him. He is unsure whether he should take replacement and he wants to read some information about it. However he has not quit smoking. Overall COPD stable with a cat score of 17   smoking history: He continues to smoke. He is unable to quit. Now he tells me that his psychiatrist is actually Dr. Harrington Challenger and when he met with her in [place] she said that she would not be the right MD  to help him with quitting smoking.  He understands the need to quit     OV 07/11/2016  Chief Complaint  Patient presents with  . Follow-up    Pt here after PFT. Pt denies change in breathing since last OV, denies cough and f/c/s. Pt states he had an episode of right sided chest tightness that lasted 20 min, pt states he was resting at the time of presentation.     Follow-up stage II COPD alpha-1 SZ the with low levels : Overall he's stable. He is currently not on any scheduled inhaler therapy due to insurance reasons and confusion as to which inhaler would be covered.. Nevertheless he feels stable and well. He had animals spirometry because of his alpha-1 and it is actually improved compared to last year.  In terms of smoking: He continues to smoke and difficult to quit  Pulmonary embolism history: He continues on baby aspirin and has not had any problems   CAT COPD Symptom & Quality of Life Score (GSK trademark) 0 is no burden. 5 is  highest burden 04/11/2016   Never Cough -> Cough all the time 2  No phlegm in chest -> Chest is full of phlegm 3  No chest tightness -> Chest feels very tight 1  No dyspnea for 1 flight stairs/hill -> Very dyspneic for 1 flight of stairs 3  No limitations for ADL at home -> Very limited with ADL at home 0  Confident leaving home -> Not at all confident leaving home 5  Sleep soundly -> Do not  sleep soundly because of lung condition 2  Lots of Energy -> No energy at all 1  TOTAL Score (max 40)  17     Results for Marti, EULISES KIJOWSKI (MRN [id number]) as of 07/11/2016 12:38  Ref. Range 11/12/2013 14:38 01/05/2016 10:28 07/11/2016 11:22  FEV1-Pre Latest Units: L 2.68 2.47 2.61  FEV1-%Pred-Pre Latest Units: % 66 62 66   Results for Rainwater, KENIEL RALSTON (MRN [id number]) as of 07/11/2016 12:38  Ref. Range 11/19/2014 14:32  D-Dimer, Quant Latest Ref Range: 0.00 - 0.48 ug/mL-FEU 0.33    has a past medical history of Anxiety; Depression; HA (headache); echocardiogram; Hyperlipidemia; Pleural effusion; Pulmonary emboli ([place]); Pulmonary nodule; and Schizo-affective psychosis ([place]).   reports that he has been smoking Cigarettes.  He has a 22.00 pack-year smoking history. He has never used smokeless tobacco.  Past Surgical History:  Procedure Laterality Date  . ASCENDING AORTIC ROOT REPLACEMENT N/A 11/14/2013   Procedure: Value sparing Root replacement, ASCENDING AORTIC ROOT REPLACEMENT, circ Arrest;  Surgeon: Gaye Pollack, MD;  Location: [facility];  Service: Open Heart Surgery;  Laterality: N/A;  . CARDIAC CATHETERIZATION    . INTRAOPERATIVE TRANSESOPHAGEAL ECHOCARDIOGRAM N/A 11/14/2013   Procedure: INTRAOPERATIVE TRANSESOPHAGEAL ECHOCARDIOGRAM;  Surgeon: Gaye Pollack, MD;  [facility];  Service: Open Heart Surgery;  Laterality: N/A;  . LEFT AND RIGHT HEART CATHETERIZATION WITH CORONARY ANGIOGRAM N/A 11/01/2013   Procedure: LEFT AND RIGHT HEART CATHETERIZATION WITH CORONARY ANGIOGRAM;  Surgeon: Josue Hector,  MD;  Location: [facility] CATH LAB;  Service: Cardiovascular;  Laterality: N/A;  . rib injury      Allergies  Allergen Reactions  . Bactrim Rash     There is no immunization history on file for this patient.  Family History  Problem Relation Age of Onset  . Heart disease Father   . Stroke Father   . Cancer Father        lung  . Heart attack Father   . COPD Father   . Alcohol abuse Father   . Diabetes Paternal Grandfather   . COPD Paternal 71   . Asthma Paternal Aunt   . Stroke Paternal Uncle      Current Outpatient Prescriptions:  .  aspirin 81 MG tablet, Take 81 mg by mouth daily., Disp: , Rfl:  .  hydrOXYzine (ATARAX/VISTARIL) 25 MG tablet, Take 1 tablet (25 mg total) by mouth 3 (three) times daily as needed., Disp: 90 tablet, Rfl: 2 .  ibuprofen (ADVIL,MOTRIN) 200 MG tablet, Take by mouth daily., Disp: , Rfl:  .  metoprolol tartrate (LOPRESSOR) 25 MG tablet, Take 0.5 tablets (12.5 mg total) by mouth 2 (two) times daily., Disp: 60 tablet, Rfl: 4 .  Oxcarbazepine (TRILEPTAL) 300 MG tablet, Take 1 tablet (300 mg total) by mouth 3 (three) times daily., Disp: 90 tablet, Rfl: 2 .  pravastatin (PRAVACHOL) 40 MG tablet, Take 1 tablet (40 mg total) by mouth daily., Disp: 90 tablet, Rfl: 3 .  prazosin (MINIPRESS) 2 MG capsule, Take 1 capsule (2 mg total) by mouth at bedtime., Disp: 30 capsule, Rfl: 2 .  sertraline (ZOLOFT) 100 MG tablet, Take 1 tablet (100 mg total) by mouth daily., Disp: 30 tablet, Rfl: 2 .  Tiotropium Bromide Monohydrate (SPIRIVA RESPIMAT) 2.5 MCG/ACT AERS, Inhale 2 puffs into the lungs daily. (Patient not taking: Reported on 07/11/2016), Disp: 1 Inhaler, Rfl: 5   Review of Systems     Objective:   Physical Exam  Constitutional: He is oriented to person, place, and  time. He appears well-developed and well-nourished. No distress.  HENT:  Head: Normocephalic and atraumatic.  Right Ear: External ear normal.  Left Ear: External ear normal.  Mouth/Throat: Oropharynx  is clear and moist. No oropharyngeal exudate.  Eyes: Conjunctivae and EOM are normal. Pupils are equal, round, and reactive to light. Right eye exhibits no discharge. Left eye exhibits no discharge. No scleral icterus.  Neck: Normal range of motion. Neck supple. No JVD present. No tracheal deviation present. No thyromegaly present.  Cardiovascular: Normal rate, regular rhythm and intact distal pulses.  Exam reveals no gallop and no friction rub.   No murmur heard. Pulmonary/Chest: Effort normal and breath sounds normal. No respiratory distress. He has no wheezes. He has no rales. He exhibits no tenderness.  Abdominal: Soft. Bowel sounds are normal. He exhibits no distension and no mass. There is no tenderness. There is no rebound and no guarding.  Musculoskeletal: Normal range of motion. He exhibits no edema or tenderness.  Lymphadenopathy:    He has no cervical adenopathy.  Neurological: He is alert and oriented to person, place, and time. He has normal reflexes. No cranial nerve deficit. Coordination normal.  Skin: Skin is warm and dry. No rash noted. He is not diaphoretic. No erythema. No pallor.  Psychiatric: He has a normal mood and affect. His behavior is normal. Judgment and thought content normal.  Nursing note and vitals reviewed.  Vitals:   07/11/16 1217  BP: 112/66  Pulse: 61  SpO2: 96%  Weight: 220 lb (99.8 kg)  Height: _0  (1.753 m)    Estimated body mass index is 32.49 kg/m as calculated from the following:   Height as of this encounter: _1  (1.753 m).   Weight as of this encounter: 220 lb (99.8 kg).       Assessment:       ICD-9-CM ICD-10-CM   1. Tobacco use 305.1 Z72.0   2. Alpha-1-antitrypsin deficiency ([place]) 273.4 E88.01   3. History of pulmonary embolism V12.55 Z86.711   4. Stage 2 moderate COPD by GOLD classification ([place]) 496 J44.9        Plan:     Tobacco use Please continue to work on qutting smoking  Alpha-1-antitrypsin deficiency  ([place]) Stable lung function No indication for infusion right now  History of pulmonary embolism Continue baby aspirin  Stage 2 moderate COPD by GOLD classification ([place]) Stable lung function  Plan Sample bevespi 2 puff twice daily scheduled Script for bevespi or incruse or tudorza and see what is cheaper and call us  Albuterol as needed   followup 6 months or sooner if needed    Dr. Brand Males, M.D., [facility].C.P Pulmonary and Critical Care Medicine Staff Physician [facility] Pager: [phone], If no answer or between  15:00h - 7:00h: call [phone]  07/11/2016 12:51 PM

## 2016-07-11 NOTE — Addendum Note (Signed)
Addended by: Collier Salina on: 07/11/2016 12:56 PM   Modules accepted: Orders

## 2016-08-02 ENCOUNTER — Encounter (HOSPITAL_COMMUNITY): Payer: Self-pay | Admitting: Psychiatry

## 2016-08-02 ENCOUNTER — Ambulatory Visit (INDEPENDENT_AMBULATORY_CARE_PROVIDER_SITE_OTHER): Payer: Medicare Other | Admitting: Psychiatry

## 2016-08-02 VITALS — BP 110/70 | HR 74 | Ht 72.0 in | Wt 221.0 lb

## 2016-08-02 DIAGNOSIS — Z791 Long term (current) use of non-steroidal anti-inflammatories (NSAID): Secondary | ICD-10-CM | POA: Diagnosis not present

## 2016-08-02 DIAGNOSIS — Z7951 Long term (current) use of inhaled steroids: Secondary | ICD-10-CM

## 2016-08-02 DIAGNOSIS — Z818 Family history of other mental and behavioral disorders: Secondary | ICD-10-CM | POA: Diagnosis not present

## 2016-08-02 DIAGNOSIS — Z79899 Other long term (current) drug therapy: Secondary | ICD-10-CM | POA: Diagnosis not present

## 2016-08-02 DIAGNOSIS — F17210 Nicotine dependence, cigarettes, uncomplicated: Secondary | ICD-10-CM

## 2016-08-02 DIAGNOSIS — F250 Schizoaffective disorder, bipolar type: Secondary | ICD-10-CM

## 2016-08-02 DIAGNOSIS — Z7982 Long term (current) use of aspirin: Secondary | ICD-10-CM

## 2016-08-02 DIAGNOSIS — F515 Nightmare disorder: Secondary | ICD-10-CM | POA: Diagnosis not present

## 2016-08-02 DIAGNOSIS — Z888 Allergy status to other drugs, medicaments and biological substances status: Secondary | ICD-10-CM | POA: Diagnosis not present

## 2016-08-02 MED ORDER — OXCARBAZEPINE 300 MG PO TABS: 300.0000 mg | ORAL_TABLET | Freq: Three times a day (TID) | ORAL | 2 refills | Status: DC

## 2016-08-02 MED ORDER — PRAZOSIN HCL 2 MG PO CAPS: 2.0000 mg | ORAL_CAPSULE | Freq: Every day | ORAL | 2 refills | Status: DC

## 2016-08-02 MED ORDER — SERTRALINE HCL 100 MG PO TABS: 100.0000 mg | ORAL_TABLET | Freq: Every day | ORAL | 2 refills | Status: DC

## 2016-08-02 MED ORDER — HYDROXYZINE HCL 25 MG PO TABS: 25.0000 mg | ORAL_TABLET | Freq: Three times a day (TID) | ORAL | 2 refills | Status: DC | PRN
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
## 2016-08-16 ENCOUNTER — Encounter (HOSPITAL_COMMUNITY): Payer: Self-pay | Admitting: Licensed Clinical Social Worker

## 2016-08-16 ENCOUNTER — Ambulatory Visit (INDEPENDENT_AMBULATORY_CARE_PROVIDER_SITE_OTHER): Payer: Medicare Other | Admitting: Licensed Clinical Social Worker

## 2016-08-16 DIAGNOSIS — F250 Schizoaffective disorder, bipolar type: Secondary | ICD-10-CM
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
## 2016-08-30 ENCOUNTER — Ambulatory Visit (INDEPENDENT_AMBULATORY_CARE_PROVIDER_SITE_OTHER): Payer: Medicare Other | Admitting: Licensed Clinical Social Worker

## 2016-08-30 ENCOUNTER — Encounter (HOSPITAL_COMMUNITY): Payer: Self-pay | Admitting: Licensed Clinical Social Worker

## 2016-08-30 DIAGNOSIS — F259 Schizoaffective disorder, unspecified: Secondary | ICD-10-CM

## 2016-08-30 DIAGNOSIS — F250 Schizoaffective disorder, bipolar type: Secondary | ICD-10-CM

## 2016-08-30 NOTE — Progress Notes (Signed)
   THERAPIST PROGRESS NOTE  Session Time: 1:00 pm-2:00 pm  Participation Level: Active  Behavioral Response: CasualAlertEuthymic  Type of Therapy: Individual Therapy  Treatment Goals addressed: Coping  Interventions: CBT and Solution Focused  Summary: Ian Grimes is a 46 y.o. male who presents presents oriented x5 (person, place, situation, time and object), alert, casual dress, appropriate grooming, and cooperative to address anger, paranoia, and depression. Patient saw a previous provider and was referred by Dr. Harrington Challenger. Patient has a history of medical treatment and mental health treatment that includes outpatient therapy, medication management and hospitalization in the past. Patient admits to some symptoms of mania including racing thoughts and shift in mood. Patient denies suicidal and homicidal ideations. Patient denies substance abuse. Patient is at no risk for lethality. Patient has a previous diagnosis of Schizoaffective Disorder: Bipolar Type. This diagnosis will be continued. Patient is experiencing irritability and difficulty in social interactions.  Patient had a average score of 2 out of 10 on the Outcome Rating Scale. Patient reported that he continues to isolate. Patient shared memories from his past where adults have let him down and he experienced emotional hurts by not being believed. Patient explained that he has a swastika tattoo that he fills in with a marker to make it look like a box. Patient explained that he got it to let people know how he feels about things. Patient explained that he actually gets along with people of all races and shared an experience where he helped an [place] American male out by giving him a ride when the man broke down in the country. Patient admitted that it is a way to further keep people away from him. Patient stated that he loves people in general but also that he doesn't like people. Patient stated he wishes that he could just live in isolation  and come into town for food and doctors visits. Patient rated the session at 9.5 out of 10 on the Session Rating Scale.  Patient engaged in session. He responded well to interventions. Patient continues to meet criteria for Schizoaffective disorder, bipolar type. Patient will continue in individual outpatient therapy due to being the least restrictive service to meet his needs. Patient made no progress on his goals at this time.   Suicidal/Homicidal: NAwithout intent/plan  Therapist Response: Therapist reviewed patient's recent thoughts and behaviors. Therapist utilized CBT to address mood. Therapist processed patient's past close relationships to identify triggers for mood. Therapist processed patient's swastika tattoo, his motivation in getting the tattoo and his personal beliefs. Therapist administered the Outcome Rating Scale and the Session Rating Scale.   Plan: Return again in 2 weeks.           Therapist will review goals on or before 09.19.2018  Diagnosis: Axis I: Schizoaffective Disorder    Axis II: No diagnosis    Glori Bickers, LCSW 08/30/2016

## 2016-09-06 ENCOUNTER — Telehealth: Payer: Self-pay | Admitting: Family Medicine

## 2016-09-06 NOTE — Telephone Encounter (Signed)
Attempted to call pt but no answer, did leave message asking pt to call us back.

## 2016-09-06 NOTE — Telephone Encounter (Signed)
Forwarding this to Dr Harrington Challenger and Dr Wendi Snipes who is covering for Dr Livia Snellen. Also sent a staff message to Dr Harrington Challenger.

## 2016-09-06 NOTE — Telephone Encounter (Signed)
Covering for PCP  This is clearly not a positive step, I would recommend OV to discuss his reasoning and directly address any concerns if he is willing.    Laroy Apple, MD [facility] 09/06/2016, 11:53 AM

## 2016-09-06 NOTE — Telephone Encounter (Signed)
Appt made for Friday  °

## 2016-09-06 NOTE — Telephone Encounter (Signed)
Pt called back from other TC encounter Appt made for Fri with Dr. Wendi Snipes

## 2016-09-06 NOTE — Telephone Encounter (Signed)
Please call pt to get him in so we can see what is going on

## 2016-09-08 ENCOUNTER — Telehealth: Payer: Self-pay | Admitting: Internal Medicine

## 2016-09-08 ENCOUNTER — Telehealth (HOSPITAL_COMMUNITY): Payer: Self-pay | Admitting: *Deleted

## 2016-09-08 NOTE — Telephone Encounter (Signed)
Pt has chosen to stop all treatment and Specialty Appts which includes no longer seeing our practice.  Pt does not plan to re-establish in the future. Patient gave no further details but asked that all if any appts be cancelled with our office. Will send to Dr Chase Caller as Juluis Rainier. Patient requests that we not call him back.

## 2016-09-08 NOTE — Telephone Encounter (Signed)
voice message from patient, he said he wont be coming back to this office and he is throwing all his meds away.   He said he is not going back to any of his doctors.   He said he is not under a court order to take meds.

## 2016-09-08 NOTE — Telephone Encounter (Signed)
Pt refused to comsider staying on meds . Wants o stop therapy. Please send discharge letter

## 2016-09-09 ENCOUNTER — Ambulatory Visit: Payer: Medicare Other | Admitting: Family Medicine

## 2016-09-12 NOTE — Telephone Encounter (Signed)
Verbal from provider to call pt due to receiving a message from pt other doctors informing her that pt informed them that he was no longer going to take any of his medications. Called pt and he stated that his medications were too expensive. Per pt he stated that his pharmacy was messing up his fills and over charging him. Per pt he stated that one month, his pharmacy charged him for 2 months and he did not request 2 mo fill. Pt then stated he can not keep affording his medications and he is not going to take his medications. Pt then stated he needs to change his pharmacy but don't know of any that is close to him due to not having a car and have to walk to the pharmacy. Per pt he is looking for a pharmacy that will deliver his medication to his home. Staff then suggested if he tried [facility] due to him living in [place]. Per pt he do not know where the pharmacy is and needed to make sure it is a pharmacy that will deliver his medications. Informed pt that staff will call [facility] for him and find out how cheap medications are for him and see if they deliver medications. Pt agreed.

## 2016-09-13 ENCOUNTER — Ambulatory Visit (HOSPITAL_COMMUNITY): Payer: Self-pay | Admitting: Licensed Clinical Social Worker

## 2016-09-13 NOTE — Telephone Encounter (Signed)
Called pharmacy and they stated that pt have 2 insurance which is Medicare and Medicaid and pt medication is with [facility]. Per [facility], pt pharmacy have to information coming to them which states if pt is using his Medicare he may have to pay a little more for his medication but if he uses his Medicaid, the most he might pay is $8.00 for his scripts. Called pt to inform him with this information. Per pt, the price is not an issue, it's just his pharmacy keeps messing up his medication and filling 2 months at a time when he's not requesting it and making the price come up to $16.00. Per pt he's just frustrated also because he does not have a car and have to walk everywhere and having to walk to his pharmacy all the time is becoming a hustle for him and coming to this office and trying to find a ride to the office is becoming a hustle. Per pt, plus he can not see any difference is if his medications are working. Asked pt if staff should try to get him in to see Dr. Harrington Challenger sooner then his f/u appt to discuss the medications due to what he just stated. Per pt, he tried many times before today to inform provider that medications are not doing anything for him and he feels like his visits are not ling enough. Staff then offered pt a longer visit time to get the chance to discuss his medication in more details with provider and pt refused to resch appt. Asked pt if he wanted to keep his appt that he have with provider on file at the time. Pt then stated he will keep the appt on file and will just keep taking his medications that provider was prescribing him until his appt. After RMA got off the phone with pt, pt called back several days later and lm stating he wanted to cancel all his appt with Dr. Harrington Challenger. Pt stated in his message that he did not have any court order that stated he have to take his medications so he will be stopping his medications and wanted office staff to cancel his appts.

## 2016-09-14 NOTE — Telephone Encounter (Signed)
I spoke to him last week. He requests discharge, please send letter

## 2016-09-14 NOTE — Telephone Encounter (Signed)
noted 

## 2016-09-15 DIAGNOSIS — Z7982 Long term (current) use of aspirin: Secondary | ICD-10-CM | POA: Diagnosis not present

## 2016-09-15 DIAGNOSIS — F17200 Nicotine dependence, unspecified, uncomplicated: Secondary | ICD-10-CM | POA: Diagnosis not present

## 2016-09-15 DIAGNOSIS — F419 Anxiety disorder, unspecified: Secondary | ICD-10-CM | POA: Diagnosis not present

## 2016-09-15 DIAGNOSIS — Z79899 Other long term (current) drug therapy: Secondary | ICD-10-CM | POA: Diagnosis not present

## 2016-09-15 DIAGNOSIS — H01002 Unspecified blepharitis right lower eyelid: Secondary | ICD-10-CM | POA: Diagnosis not present

## 2016-09-15 DIAGNOSIS — J439 Emphysema, unspecified: Secondary | ICD-10-CM | POA: Diagnosis not present

## 2016-09-20 ENCOUNTER — Ambulatory Visit (HOSPITAL_COMMUNITY): Payer: Self-pay | Admitting: Psychiatry

## 2016-09-20 NOTE — Telephone Encounter (Signed)
okl thanks for notification   Dr. Brand Males, M.D., [facility] Staff Physician [facility] Pager: [phone], If no answer or between  15:00h - 7:00h: call [phone]  09/20/2016 8:47 AM

## 2016-09-29 ENCOUNTER — Ambulatory Visit (HOSPITAL_COMMUNITY): Payer: Self-pay | Admitting: Psychiatry

## 2016-11-28 DIAGNOSIS — J0100 Acute maxillary sinusitis, unspecified: Secondary | ICD-10-CM | POA: Diagnosis not present

## 2016-11-28 DIAGNOSIS — Z720 Tobacco use: Secondary | ICD-10-CM | POA: Diagnosis not present

## 2016-11-28 DIAGNOSIS — Z7984 Long term (current) use of oral hypoglycemic drugs: Secondary | ICD-10-CM | POA: Diagnosis not present

## 2016-11-28 DIAGNOSIS — F17200 Nicotine dependence, unspecified, uncomplicated: Secondary | ICD-10-CM | POA: Diagnosis not present

## 2016-11-28 DIAGNOSIS — Z79899 Other long term (current) drug therapy: Secondary | ICD-10-CM | POA: Diagnosis not present

## 2016-12-12 ENCOUNTER — Other Ambulatory Visit (HOSPITAL_COMMUNITY): Payer: Self-pay | Admitting: Psychiatry

## 2017-01-06 DIAGNOSIS — Z6832 Body mass index (BMI) 32.0-32.9, adult: Secondary | ICD-10-CM | POA: Diagnosis not present

## 2017-01-06 DIAGNOSIS — J301 Allergic rhinitis due to pollen: Secondary | ICD-10-CM | POA: Diagnosis not present

## 2017-01-06 DIAGNOSIS — F411 Generalized anxiety disorder: Secondary | ICD-10-CM | POA: Diagnosis not present

## 2017-01-06 DIAGNOSIS — I10 Essential (primary) hypertension: Secondary | ICD-10-CM | POA: Diagnosis not present

## 2017-01-06 DIAGNOSIS — E782 Mixed hyperlipidemia: Secondary | ICD-10-CM | POA: Diagnosis not present

## 2017-01-17 ENCOUNTER — Ambulatory Visit (INDEPENDENT_AMBULATORY_CARE_PROVIDER_SITE_OTHER): Payer: Medicare Other | Admitting: Internal Medicine

## 2017-01-17 ENCOUNTER — Telehealth: Payer: Self-pay | Admitting: Internal Medicine

## 2017-01-17 ENCOUNTER — Encounter: Payer: Self-pay | Admitting: Internal Medicine

## 2017-01-17 VITALS — BP 138/80 | HR 84 | Ht 70.0 in | Wt 223.0 lb

## 2017-01-17 DIAGNOSIS — Z87891 Personal history of nicotine dependence: Secondary | ICD-10-CM | POA: Diagnosis not present

## 2017-01-17 DIAGNOSIS — E8801 Alpha-1-antitrypsin deficiency: Secondary | ICD-10-CM | POA: Diagnosis not present

## 2017-01-17 DIAGNOSIS — J449 Chronic obstructive pulmonary disease, unspecified: Secondary | ICD-10-CM

## 2017-01-17 DIAGNOSIS — Z23 Encounter for immunization: Secondary | ICD-10-CM

## 2017-01-17 MED ORDER — TIOTROPIUM BROMIDE MONOHYDRATE 1.25 MCG/ACT IN AERS: 2.0000 | INHALATION_SPRAY | Freq: Every day | RESPIRATORY_TRACT | 3 refills | Status: DC

## 2017-01-17 NOTE — Telephone Encounter (Signed)
Received a fax from pt's pharmacy stating that an alternative is requested based on pt's insurance for the [place].  The alternatives that are covered by pt's insurance are Advair, Breo, Incruse, or Anoro.  MR, please advise which med you want [place] to send for pt in place of the Spiriva Respimat.  Thanks!

## 2017-01-17 NOTE — Progress Notes (Signed)
Subjective:     Patient ID: Ian Grimes, male   DOB: 1970-05-29, 46 y.o.   MRN: [id number]  HPI    HPI     IOV 06/12/2013  Chief Complaint  Patient presents with  . Advice Only    Referred for pulmonary nodule.  Had CT chest at the end of March.  Has no breathing complaints at this time.    46 year old smoker with significant psychiatric history but a fairly good historian also with anxiety, hyperlipidemia and smoking. He was admitted 05/06/2013 with 3 week history of dyspnea. Was diagnosed with bilateral pulmonary embolism. Treated by the hospitalist service. He was on 2 L nasal cannula with a respirator 30 per minute at the time of admission. He was discharged 05/08/2013 on xarelto. Since then he has returned to baseline health. He is followed up with his primary care physician. He says that for the last 1 week he is not taking his xarelto because his primary care physician apparently told him that the refills are actually once a month. He is also confused that his primary care physician has been regarding that he is on Coumadin but he insists that he wants on xarelto up until one week ago and that there was no change in plan to take any other medication. He had tolerated xarelto well without any problems of bleeding or compliance issues.  CT scan of the chest at that time should 8 mm subpleural nodule in the lateral segment of the right middle lobe and that is the main reason for referral today  Other she smoking: He continues to smoke and is unable to quit.  reports that he has been smoking Cigarettes.  He has a 22 pack-year smoking history. He has never used smokeless tobacco.  IMPRESSION:   CT chest 05/06/13 Segmental pulmonary emboli within all lobes. Overall clot burden is  moderate.  8 x 6 mm triangular subpleural nodule in the lateral right middle  lobe, corresponding to the radiographic abnormality, favored to  reflect a benign subpleural lymph node but technically   indeterminate. Given patient's high risk for primary bronchogenic  neoplasm, initial follow-up CT chest is suggested in 6 months.  Faint ground-glass nodular opacities in the left upper lobe/lingula,  possibly infectious/inflammatory.  Trace right pleural effusion.  Nodule recommendation follows the consensus statement: Guidelines  for Management of Small Pulmonary Nodules Detected on CT Scans: A  Statement from the [place] as published in Radiology  2005; 237:395-400.  Critical value/emergent results were called by telephone at the time  of interpretation on 05/06/2013 at 9:12 AM to [facility], who  verbally acknowledged these results.  Electronically Signed  By: Julian Hy M.D.  On: 05/06/2013 09:35         OV 08/19/2013  Chief Complaint  Patient presents with  . Follow-up    Pt states breathing is unchanged. Pt states he only coughs to clear his throat. Pt states he constant sinus headaches. Pt denies SOB and CP.     Folllowup   Smoking: still smokes. Struggling to q uit. Depression makes it harder to quit  PE: back on xaretlo 13m per day after med review. He has no problems. MOwed yard 4h and no dyspnea   Lung nodule: has resolved June 2015 CT chest but new finding: Ascending Aorta Aneurumsm 4.5cm on CT chest   IMPRESSION:  1. No suspicious pulmonary nodules. Subpleural density previously  noted at the right lung base has resolved, likely atelectasis or  sequela  of pulmonary embolism demonstrated on prior examination.  2. Mild centrilobular and paraseptal emphysema.  3. Ascending aortic aneurysm appears mildly enlarged compared with  the prior study. Of note, previously demonstrated pulmonary embolism  is not addressed on this noncontrast study.  Electronically Signed  By: Camie Patience M.D.  On: 08/07/2013 12:30     OV 11/19/2014  Chief Complaint  Patient presents with  . Follow-up    Pt c/o mild DOE, prod cough with clear mucus. Pt  deneis CP/tightness.     SMoking:  reports that he has been smoking Cigarettes.  He has a 22 pack-year smoking history. He has never used smokeless tobacco. CT scan of the chest in 2015 did show some subclinical emphysema  Pulmonary embolism: This was diagnosed in the spring of 2015. He completed one year Xarelto therapy earlier this year and I believe either under the instructions of primary care physician or cardiologist he came off therapy. He has since been referred here to follow-up. He feels fine without any chest pain hemoptysis syncope cough or dyspnea. The only amount of dyspnea he has this when he initially starts walking but as he warms up he does not have any dyspnea. His effort tolerance is good. Cardiologist wondered about hypercoagulable workup.     OV 06/01/2015  Chief Complaint  Patient presents with  . Follow-up    Pt here for 6 months f/u. Pt states his breathing is unchanged since last OV. Pt c/o prod cough with clear thin mucus and chest tightness at random times.     46 year old male  0- has subclinical emphysema: CT chest June 2015 without any nodules. He reports ongoing shortness of breath with exertion for walking over a block on level ground. This is stable. Insidious onset for the last 3-4 years. Associated with being overweight. No  Pulmonary function tests this visit  - Alpha 1 genetic deficiency: Last visit I checked his alpha-1. His level was 76. Phenotype was  SZ and abnormal. Shed results with him again  - Pulmonary embolism: Completed one year of therapy summer 2016 since then no clinical evidence of pulmonary embolism or DVT. He continues on aspirin therapy. D-dimer at last visit was normal suggesting recurrence risk is low  - Smoking: He continues to smoke. He knows he needs to quit but is finding it difficult  Past medical history: He has chronic headaches. He canceled an MRI due to concern of prosthesis and acyanotic. He'll be with his neurologist  again    has a past medical history of Schizo-affective psychosis ([place]); Anxiety; Depression; HA (headache); Hyperlipidemia; Pulmonary emboli ([place]); Pulmonary nodule; Pleural effusion; and echocardiogram.   reports that he has been smoking Cigarettes.  He has a 22 pack-year smoking history. He has never used smokeless tobacco.  OV 01/05/2016  Chief Complaint  Patient presents with  . Follow-up    Pt here after PFT. Pt denies changes in SOB since last OV. Pt c/o prod cough with clear mucus. Pt states he is getting over a cold. Pt denies CP/tightness and f/c/s.     46 year old male with smoking history, emphysema and history of pulmonary embolism status post completion of anticoagulation. This in the background setting of schizoaffective disorder and taking multiple psychiatric drugs  Emphysema associated with alpha-1 SZ: He says his breathing is stable. He has class II dyspnea and exertion and he notices this when walking up a steep incline. It is relieved by rest. This no associated chest pain. The cough is associated  as mild and dry. No diaphoresis or palpitations. Overall symptoms are stable compared to 2 years ago. However he did have pulmonary function test that is documented below and shows decline both in the prebronchodilator elements and the post bronchodilator elements and diffusion capacity. His alpha-1 deficiency. He is aware of this. I'm not so sure that he has to talk to his family members about it.  Smoking history: He continues to smoke. He says it helps on his notes. He says it'll be very difficult to quit. His psychiatrist is Dr. Sima Matas in [place]. He says he willing to take his help in trying to quit smoking.  Pulmonary Embolism: Last taken anticoagulation agent months ago. D-dimer was normal. He is on daily aspirin therapy. He says he is compliant with aspirin. No symptoms of recurrence.   OV 04/11/2016  Chief Complaint  Patient presents with  . Follow-up    Pt.  states he feels like his breathing has remained unchanged,He is still coughing but it has been better  But he still has sinus headaches, Denies chest tightness,fever   Follow-up  History of pulmonary embolism: He is on baby aspirin prophylaxis. He says he is compliant with it. No clinical evidence of recurrence based on history this visit  Alpha-1 antitrypsin deficiency SZ with low levels with emphysema: He is taking Spiriva . He says he spoke to his aunt about his alpha-1 and he says the whole family knows about the alpha-1 deficiency but they failed to inform him. He is unsure whether he should take replacement and he wants to read some information about it. However he has not quit smoking. Overall COPD stable with a cat score of 17   smoking history: He continues to smoke. He is unable to quit. Now he tells me that his psychiatrist is actually Dr. Harrington Challenger and when he met with her in [place] she said that she would not be the right MD  to help him with quitting smoking.  He understands the need to quit     OV 07/11/2016  Chief Complaint  Patient presents with  . Follow-up    Pt here after PFT. Pt denies change in breathing since last OV, denies cough and f/c/s. Pt states he had an episode of right sided chest tightness that lasted 20 min, pt states he was resting at the time of presentation.     Follow-up stage II COPD alpha-1 SZ the with low levels : Overall he's stable. He is currently not on any scheduled inhaler therapy due to insurance reasons and confusion as to which inhaler would be covered.. Nevertheless he feels stable and well. He had animals spirometry because of his alpha-1 and it is actually improved compared to last year.  In terms of smoking: He continues to smoke and difficult to quit  Pulmonary embolism history: He continues on baby aspirin and has not had any problems   OV 01/17/2017  Chief Complaint  Patient presents with  . Follow-up    Pt states that he is doing  better. Denies any real complaints of cough, SOB, or CP.    Follow-up multiple issues  Gold stage II COPD alpha-1 SZ with low levels: There is a 2-monthfollow-up.  Symptom wise he is stable.  His COPD CAD score below has improved and this is without any inhaler therapy.  I gave him sample inhalers he said it did not make any difference.  He did not price these inhalers.  He is not even using albuterol  as needed.  I reeducated him about the need for maintenance.  We decided to keep it simple and just use Spiriva  Smoking: He has associated depression and he finds it hard to quit.  According to the primary care notes the depression itself is stable  Pulmonary embolism history: He continues on baby aspirin has not had any problems   CAT COPD Symptom & Quality of Life Score ([place]) 0 is no burden. 5 is highest burden 04/11/2016  01/17/2017   Never Cough -> Cough all the time 2 1  No phlegm in chest -> Chest is full of phlegm 3 2  No chest tightness -> Chest feels very tight 1 0  No dyspnea for 1 flight stairs/hill -> Very dyspneic for 1 flight of stairs 3 2  No limitations for ADL at home -> Very limited with ADL at home 0 0  Confident leaving home -> Not at all confident leaving home 5 0  Sleep soundly -> Do not sleep soundly because of lung condition 2 4  Lots of Energy -> No energy at all 1 3  TOTAL Score (max 40)  17 12     Results for Ian Grimes, Ian Grimes (MRN [id number]) as of 07/11/2016 12:38  Ref. Range 11/12/2013 14:38 01/05/2016 10:28 07/11/2016 11:22  FEV1-Pre Latest Units: L 2.68 2.47 2.61  FEV1-%Pred-Pre Latest Units: % 66 62 66   Results for Ian Grimes, Ian Grimes (MRN [id number]) as of 07/11/2016 12:38  Ref. Range 11/19/2014 14:32  D-Dimer, Quant Latest Ref Range: 0.00 - 0.48 ug/mL-FEU 0.33      has a past medical history of Anxiety, Depression, HA (headache), echocardiogram, Hyperlipidemia, Pleural effusion, Pulmonary emboli ([place]), Pulmonary nodule, and Schizo-affective  psychosis ([place]).   reports that he has been smoking cigarettes.  He has a 22.00 pack-year smoking history. he has never used smokeless tobacco.  Past Surgical History:  Procedure Laterality Date  . CARDIAC CATHETERIZATION    . INTRAOPERATIVE TRANSESOPHAGEAL ECHOCARDIOGRAM N/A 11/14/2013   Performed by Gaye Pollack, MD at [facility] OR  . LEFT AND RIGHT HEART CATHETERIZATION WITH CORONARY ANGIOGRAM N/A 11/01/2013   Performed by Josue Hector, MD at [facility] CATH LAB  . rib injury    . Value sparing Root replacement, ASCENDING AORTIC ROOT REPLACEMENT, circ Arrest N/A 11/14/2013   Performed by Gaye Pollack, MD at [facility] OR    Allergies  Allergen Reactions  . Bactrim Rash     There is no immunization history on file for this patient.  Family History  Problem Relation Age of Onset  . Heart disease Father   . Stroke Father   . Cancer Father        lung  . Heart attack Father   . COPD Father   . Alcohol abuse Father   . Diabetes Paternal Grandfather   . COPD Paternal 25   . Asthma Paternal Aunt   . Stroke Paternal Uncle      Current Outpatient Medications:  .  acetaminophen (TYLENOL) 500 MG tablet, Take 500 mg every 6 (six) hours as needed by mouth (Pt takes 2-4 tabs prn.)., Disp: , Rfl:  .  aspirin 81 MG tablet, Take 81 mg by mouth daily., Disp: , Rfl:  .  hydrOXYzine (ATARAX/VISTARIL) 25 MG tablet, Take 1 tablet (25 mg total) by mouth 3 (three) times daily as needed., Disp: 90 tablet, Rfl: 2 .  metoprolol tartrate (LOPRESSOR) 25 MG tablet, Take 0.5 tablets (12.5 mg total) by mouth 2 (two) times  daily., Disp: 60 tablet, Rfl: 4 .  pravastatin (PRAVACHOL) 40 MG tablet, Take 1 tablet (40 mg total) by mouth daily., Disp: 90 tablet, Rfl: 3 .  sertraline (ZOLOFT) 100 MG tablet, Take 1 tablet (100 mg total) by mouth daily., Disp: 30 tablet, Rfl: 2 .  montelukast (SINGULAIR) 10 MG tablet, , Disp: , Rfl:   Review of Systems     Objective:   Physical Exam  Constitutional: He is oriented to  person, place, and time. He appears well-developed and well-nourished. No distress.  HENT:  Head: Normocephalic and atraumatic.  Right Ear: External ear normal.  Left Ear: External ear normal.  Mouth/Throat: Oropharynx is clear and moist. No oropharyngeal exudate.  Eyes: Conjunctivae and EOM are normal. Pupils are equal, round, and reactive to light. Right eye exhibits no discharge. Left eye exhibits no discharge. No scleral icterus.  Neck: Normal range of motion. Neck supple. No JVD present. No tracheal deviation present. No thyromegaly present.  Cardiovascular: Normal rate, regular rhythm and intact distal pulses. Exam reveals no gallop and no friction rub.  No murmur heard. Pulmonary/Chest: Effort normal and breath sounds normal. No respiratory distress. He has no wheezes. He has no rales. He exhibits no tenderness.  Abdominal: Soft. Bowel sounds are normal. He exhibits no distension and no mass. There is no tenderness. There is no rebound and no guarding.  Musculoskeletal: Normal range of motion. He exhibits no edema or tenderness.  Lymphadenopathy:    He has no cervical adenopathy.  Neurological: He is alert and oriented to person, place, and time. He has normal reflexes. No cranial nerve deficit. Coordination normal.  Skin: Skin is warm and dry. No rash noted. He is not diaphoretic. No erythema. No pallor.  Psychiatric: He has a normal mood and affect. His behavior is normal. Judgment and thought content normal.  Nursing note and vitals reviewed.  Vitals:   01/17/17 0921  BP: 138/80  Pulse: 84  SpO2: 96%  Weight: 223 lb (101.2 kg)  Height: '5\' 10"'  (1.778 m)    Estimated body mass index is 32 kg/m as calculated from the following:   Height as of this encounter: '5\' 10"'  (1.778 m).   Weight as of this encounter: 223 lb (101.2 kg).     Assessment:       ICD-10-CM   1. Alpha-1-antitrypsin deficiency ([place]) E88.01   2. Stage 2 moderate COPD by GOLD classification ([place]) J44.9   3.  Smoking history Z87.891        Plan:     Tobacco use Please continue to work on qutting smoking  Alpha-1-antitrypsin deficiency ([place]) Stable lung function No indication for infusion right now  History of pulmonary embolism Continue baby aspirin  Stage 2 moderate COPD by GOLD classification ([place]) Stable lung function by symptoms  Plan Flu shot 01/17/2017 Sample spiriva respimat and presciption take daily; you need daily scheduled maintenance Albuterol as needed     Followup In 6 months do Pre-bd spiro and dlco only. No lung volume or bd response. No post-bd spiro 6 months or needed  Dr. Brand Males, M.D., [facility].C.P Pulmonary and Critical Care Medicine Staff Physician [facility] Pager: [phone], If no answer or between  15:00h - 7:00h: call [phone]  01/17/2017 9:45 AM

## 2017-01-17 NOTE — Patient Instructions (Addendum)
Tobacco use Please continue to work on qutting smoking  Alpha-1-antitrypsin deficiency ([place]) Stable lung function No indication for infusion right now  History of pulmonary embolism Continue baby aspirin  Stage 2 moderate COPD by GOLD classification ([place]) Stable lung function by symptoms  Plan Flu shot 01/17/2017 Sample spiriva respimat and presciption take daily; you need daily scheduled maintenance Albuterol as needed     Followup In 6 months do Pre-bd spiro and dlco only. No lung volume or bd response. No post-bd spiro 6 months or needed

## 2017-01-23 NOTE — Telephone Encounter (Signed)
Left message for patient to call back  

## 2017-01-23 NOTE — Telephone Encounter (Signed)
Try incruse instead of spiriva respimat  Dr. Brand Males, M.D., [facility].C.P Pulmonary and Critical Care Medicine Staff Physician, [place] Director - Interstitial Lung Disease  Program  Pulmonary [place] at [place], [place], [address]  Pager: [phone], If no answer or between  15:00h - 7:00h: call [phone] Telephone: [phone]

## 2017-02-04 ENCOUNTER — Other Ambulatory Visit (HOSPITAL_COMMUNITY): Payer: Self-pay | Admitting: Psychiatry

## 2017-02-09 DIAGNOSIS — M25521 Pain in right elbow: Secondary | ICD-10-CM | POA: Diagnosis not present

## 2017-02-09 DIAGNOSIS — F411 Generalized anxiety disorder: Secondary | ICD-10-CM | POA: Diagnosis not present

## 2017-02-09 DIAGNOSIS — Z1389 Encounter for screening for other disorder: Secondary | ICD-10-CM | POA: Diagnosis not present

## 2017-02-09 DIAGNOSIS — M79644 Pain in right finger(s): Secondary | ICD-10-CM | POA: Diagnosis not present

## 2017-04-03 ENCOUNTER — Other Ambulatory Visit: Payer: Self-pay | Admitting: *Deleted

## 2017-04-03 ENCOUNTER — Telehealth: Payer: Self-pay | Admitting: Internal Medicine

## 2017-04-03 NOTE — Telephone Encounter (Signed)
Go with INCRUSE  Dr. Brand Males, M.D., [facility].C.P Pulmonary and Critical Care Medicine Staff Physician, [place] Director - Interstitial Lung Disease  Program  P[facility] [place] at [place], [place], [address]  Pager: [phone], If no answer or between  15:00h - 7:00h: call [phone] Telephone: [phone]

## 2017-04-03 NOTE — Telephone Encounter (Signed)
Received request of alternative med request for pt's spiriva respimat as it is not covered by pt's insurance.  Advair, Breo, Incruse, and Anoro are covered alternatives per pt's insurance.  MR, please advise of the above listed which you want to switch pt to.  Thanks!

## 2017-04-03 NOTE — Telephone Encounter (Signed)
Left voice mail on machine for patient to return phone call back regarding change in inhaler. It is okay to change from spirivia resimat to incuse.  X1

## 2017-04-04 NOTE — Telephone Encounter (Signed)
Attempted to call pt but no answer.  Left message for pt to return our call x2 

## 2017-04-06 ENCOUNTER — Encounter: Payer: Self-pay | Admitting: *Deleted

## 2017-04-06 NOTE — Telephone Encounter (Addendum)
Attempted to call. Left message to call back.  Due to several attempts to call Patient will send letter for Patient to call us back. Will sign off.

## 2017-04-12 ENCOUNTER — Telehealth: Payer: Self-pay | Admitting: Internal Medicine

## 2017-04-12 MED ORDER — UMECLIDINIUM BROMIDE 62.5 MCG/INH IN AEPB: 1.0000 | INHALATION_SPRAY | Freq: Every day | RESPIRATORY_TRACT | 3 refills | Status: DC

## 2017-04-12 NOTE — Telephone Encounter (Signed)
Called and spoke with patient, he is aware of medication change from Spiriva to Incruse (see phone note 2.4) sent to correct pharmacy nothing further needed.

## 2017-07-01 DIAGNOSIS — R0789 Other chest pain: Secondary | ICD-10-CM | POA: Diagnosis not present

## 2017-07-11 DIAGNOSIS — Z6832 Body mass index (BMI) 32.0-32.9, adult: Secondary | ICD-10-CM | POA: Diagnosis not present

## 2017-07-11 DIAGNOSIS — J301 Allergic rhinitis due to pollen: Secondary | ICD-10-CM | POA: Diagnosis not present

## 2017-07-11 DIAGNOSIS — I10 Essential (primary) hypertension: Secondary | ICD-10-CM | POA: Diagnosis not present

## 2017-07-11 DIAGNOSIS — E782 Mixed hyperlipidemia: Secondary | ICD-10-CM | POA: Diagnosis not present

## 2017-07-11 DIAGNOSIS — R079 Chest pain, unspecified: Secondary | ICD-10-CM | POA: Diagnosis not present

## 2017-07-11 DIAGNOSIS — F411 Generalized anxiety disorder: Secondary | ICD-10-CM | POA: Diagnosis not present

## 2017-07-12 ENCOUNTER — Other Ambulatory Visit: Payer: Self-pay | Admitting: *Deleted

## 2017-07-19 ENCOUNTER — Ambulatory Visit (INDEPENDENT_AMBULATORY_CARE_PROVIDER_SITE_OTHER): Payer: Medicare Other | Admitting: Internal Medicine

## 2017-07-19 ENCOUNTER — Encounter: Payer: Self-pay | Admitting: Internal Medicine

## 2017-07-19 VITALS — BP 114/70 | HR 53 | Ht 69.0 in | Wt 225.0 lb

## 2017-07-19 DIAGNOSIS — J449 Chronic obstructive pulmonary disease, unspecified: Secondary | ICD-10-CM

## 2017-07-19 DIAGNOSIS — Z87891 Personal history of nicotine dependence: Secondary | ICD-10-CM

## 2017-07-19 DIAGNOSIS — E8801 Alpha-1-antitrypsin deficiency: Secondary | ICD-10-CM

## 2017-07-19 LAB — PULMONARY FUNCTION TEST
DL/VA % pred: 88 %
DL/VA: 4.05 ml/min/mmHg/L
DLCO unc % pred: 70 %
DLCO unc: 21.99 ml/min/mmHg
FEF 25-75 Pre: 1.39 L/sec
FEF2575-%PRED-PRE: 39 %
FEV1-%PRED-PRE: 59 %
FEV1-PRE: 2.32 L
FEV1FVC-%Pred-Pre: 89 %
FEV6-%Pred-Pre: 68 %
FEV6-PRE: 3.27 L
FEV6FVC-%Pred-Pre: 102 %
FVC-%Pred-Pre: 66 %
FVC-PRE: 3.31 L
PRE FEV1/FVC RATIO: 70 %
Pre FEV6/FVC Ratio: 99 %

## 2017-07-19 MED ORDER — UMECLIDINIUM BROMIDE 62.5 MCG/INH IN AEPB: 1.0000 | INHALATION_SPRAY | Freq: Every day | RESPIRATORY_TRACT | 0 refills | Status: DC

## 2017-07-19 NOTE — Progress Notes (Signed)
Subjective:     Patient ID: Ian Grimes, male   DOB: 1970-10-13, 47 y.o.   MRN: [id number]  HPI     IOV 06/12/2013  Chief Complaint  Patient presents with  . Advice Only    Referred for pulmonary nodule.  Had CT chest at the end of March.  Has no breathing complaints at this time.    47 year old smoker with significant psychiatric history but a fairly good historian also with anxiety, hyperlipidemia and smoking. He was admitted 05/06/2013 with 3 week history of dyspnea. Was diagnosed with bilateral pulmonary embolism. Treated by the hospitalist service. He was on 2 L nasal cannula with a respirator 30 per minute at the time of admission. He was discharged 05/08/2013 on xarelto. Since then he has returned to baseline health. He is followed up with his primary care physician. He says that for the last 1 week he is not taking his xarelto because his primary care physician apparently told him that the refills are actually once a month. He is also confused that his primary care physician has been regarding that he is on Coumadin but he insists that he wants on xarelto up until one week ago and that there was no change in plan to take any other medication. He had tolerated xarelto well without any problems of bleeding or compliance issues.  CT scan of the chest at that time should 8 mm subpleural nodule in the lateral segment of the right middle lobe and that is the main reason for referral today  Other she smoking: He continues to smoke and is unable to quit.  reports that he has been smoking Cigarettes.  He has a 22 pack-year smoking history. He has never used smokeless tobacco.  IMPRESSION:   CT chest 05/06/13 Segmental pulmonary emboli within all lobes. Overall clot burden is  moderate.  8 x 6 mm triangular subpleural nodule in the lateral right middle  lobe, corresponding to the radiographic abnormality, favored to  reflect a benign subpleural lymph node but technically  indeterminate.  Given patient's high risk for primary bronchogenic  neoplasm, initial follow-up CT chest is suggested in 6 months.  Faint ground-glass nodular opacities in the left upper lobe/lingula,  possibly infectious/inflammatory.  Trace right pleural effusion.  Nodule recommendation follows the consensus statement: Guidelines  for Management of Small Pulmonary Nodules Detected on CT Scans: A  Statement from the [employer] as published in Radiology  2005; 237:395-400.  Critical value/emergent results were called by telephone at the time  of interpretation on 05/06/2013 at 9:12 AM to [facility], who  verbally acknowledged these results.  Electronically Signed  By: Julian Hy M.D.  On: 05/06/2013 09:35         OV 08/19/2013  Chief Complaint  Patient presents with  . Follow-up    Pt states breathing is unchanged. Pt states he only coughs to clear his throat. Pt states he constant sinus headaches. Pt denies SOB and CP.     Folllowup   Smoking: still smokes. Struggling to q uit. Depression makes it harder to quit  PE: back on xaretlo 20m per day after med review. He has no problems. MOwed yard 4h and no dyspnea   Lung nodule: has resolved June 2015 CT chest but new finding: Ascending Aorta Aneurumsm 4.5cm on CT chest   IMPRESSION:  1. No suspicious pulmonary nodules. Subpleural density previously  noted at the right lung base has resolved, likely atelectasis or  sequela of pulmonary embolism demonstrated  on prior examination.  2. Mild centrilobular and paraseptal emphysema.  3. Ascending aortic aneurysm appears mildly enlarged compared with  the prior study. Of note, previously demonstrated pulmonary embolism  is not addressed on this noncontrast study.  Electronically Signed  By: Camie Patience M.D.  On: 08/07/2013 12:30     OV 11/19/2014  Chief Complaint  Patient presents with  . Follow-up    Pt c/o mild DOE, prod cough with clear mucus. Pt deneis CP/tightness.      SMoking:  reports that he has been smoking Cigarettes.  He has a 22 pack-year smoking history. He has never used smokeless tobacco. CT scan of the chest in 2015 did show some subclinical emphysema  Pulmonary embolism: This was diagnosed in the spring of 2015. He completed one year Xarelto therapy earlier this year and I believe either under the instructions of primary care physician or cardiologist he came off therapy. He has since been referred here to follow-up. He feels fine without any chest pain hemoptysis syncope cough or dyspnea. The only amount of dyspnea he has this when he initially starts walking but as he warms up he does not have any dyspnea. His effort tolerance is good. Cardiologist wondered about hypercoagulable workup.     OV 06/01/2015  Chief Complaint  Patient presents with  . Follow-up    Pt here for 6 months f/u. Pt states his breathing is unchanged since last OV. Pt c/o prod cough with clear thin mucus and chest tightness at random times.     47 year old male  0- has subclinical emphysema: CT chest June 2015 without any nodules. He reports ongoing shortness of breath with exertion for walking over a block on level ground. This is stable. Insidious onset for the last 3-4 years. Associated with being overweight. No  Pulmonary function tests this visit  - Alpha 1 genetic deficiency: Last visit I checked his alpha-1. His level was 76. Phenotype was  SZ and abnormal. Shed results with him again  - Pulmonary embolism: Completed one year of therapy summer 2016 since then no clinical evidence of pulmonary embolism or DVT. He continues on aspirin therapy. D-dimer at last visit was normal suggesting recurrence risk is low  - Smoking: He continues to smoke. He knows he needs to quit but is finding it difficult  Past medical history: He has chronic headaches. He canceled an MRI due to concern of prosthesis and acyanotic. He'll be with his neurologist again    has a past  medical history of Schizo-affective psychosis ([place]); Anxiety; Depression; HA (headache); Hyperlipidemia; Pulmonary emboli ([place]); Pulmonary nodule; Pleural effusion; and echocardiogram.   reports that he has been smoking Cigarettes.  He has a 22 pack-year smoking history. He has never used smokeless tobacco.  OV 01/05/2016  Chief Complaint  Patient presents with  . Follow-up    Pt here after PFT. Pt denies changes in SOB since last OV. Pt c/o prod cough with clear mucus. Pt states he is getting over a cold. Pt denies CP/tightness and f/c/s.     47 year old male with smoking history, emphysema and history of pulmonary embolism status post completion of anticoagulation. This in the background setting of schizoaffective disorder and taking multiple psychiatric drugs  Emphysema associated with alpha-1 SZ: He says his breathing is stable. He has class II dyspnea and exertion and he notices this when walking up a steep incline. It is relieved by rest. This no associated chest pain. The cough is associated as mild and dry.  No diaphoresis or palpitations. Overall symptoms are stable compared to 2 years ago. However he did have pulmonary function test that is documented below and shows decline both in the prebronchodilator elements and the post bronchodilator elements and diffusion capacity. His alpha-1 deficiency. He is aware of this. I'm not so sure that he has to talk to his family members about it.  Smoking history: He continues to smoke. He says it helps on his notes. He says it'll be very difficult to quit. His psychiatrist is Dr. Sima Matas in [place]. He says he willing to take his help in trying to quit smoking.  Pulmonary Embolism: Last taken anticoagulation agent months ago. D-dimer was normal. He is on daily aspirin therapy. He says he is compliant with aspirin. No symptoms of recurrence.   OV 04/11/2016  Chief Complaint  Patient presents with  . Follow-up    Pt. states he feels like his  breathing has remained unchanged,He is still coughing but it has been better  But he still has sinus headaches, Denies chest tightness,fever   Follow-up  History of pulmonary embolism: He is on baby aspirin prophylaxis. He says he is compliant with it. No clinical evidence of recurrence based on history this visit  Alpha-1 antitrypsin deficiency SZ with low levels with emphysema: He is taking Spiriva . He says he spoke to his aunt about his alpha-1 and he says the whole family knows about the alpha-1 deficiency but they failed to inform him. He is unsure whether he should take replacement and he wants to read some information about it. However he has not quit smoking. Overall COPD stable with a cat score of 17   smoking history: He continues to smoke. He is unable to quit. Now he tells me that his psychiatrist is actually Dr. Harrington Challenger and when he met with her in [place] she said that she would not be the right MD  to help him with quitting smoking.  He understands the need to quit     OV 07/11/2016  Chief Complaint  Patient presents with  . Follow-up    Pt here after PFT. Pt denies change in breathing since last OV, denies cough and f/c/s. Pt states he had an episode of right sided chest tightness that lasted 20 min, pt states he was resting at the time of presentation.     Follow-up stage II COPD alpha-1 SZ the with low levels : Overall he's stable. He is currently not on any scheduled inhaler therapy due to insurance reasons and confusion as to which inhaler would be covered.. Nevertheless he feels stable and well. He had animals spirometry because of his alpha-1 and it is actually improved compared to last year.  In terms of smoking: He continues to smoke and difficult to quit  Pulmonary embolism history: He continues on baby aspirin and has not had any problems   OV 01/17/2017  Chief Complaint  Patient presents with  . Follow-up    Pt states that he is doing better. Denies any real  complaints of cough, SOB, or CP.    Follow-up multiple issues  Gold stage II COPD alpha-1 SZ with low levels: There is a 54-monthfollow-up.  Symptom wise he is stable.  His COPD CAD score below has improved and this is without any inhaler therapy.  I gave him sample inhalers he said it did not make any difference.  He did not price these inhalers.  He is not even using albuterol as needed.  I  reeducated him about the need for maintenance.  We decided to keep it simple and just use Spiriva  Smoking: He has associated depression and he finds it hard to quit.  According to the primary care notes the depression itself is stable  Pulmonary embolism history: He continues on baby aspirin has not had any problems   OV 07/19/2017  Chief Complaint  Patient presents with  . Follow-up    PFT done today. Pt states he has been doing good since last visit. Denies any real complaints.   Gold stage II COPD Alpha-1 deficiency SZ\smoking  Presents for routine follow-up.  Overall feels stable.  Pulmonary function test shows a decline in lung function although diffusion capacity is the same.  He still continues to smoke.  In talking to him it appears as anticholinergics was sent to wrong pharmacy and therefore is not on any anticholinergic.  He knows he is to quit smoking.  There are no other new issues.    CAT COPD Symptom & Quality of Life Score (GSK trademark) 0 is no burden. 5 is highest burden 04/11/2016  01/17/2017  07/19/2017   Never Cough -> Cough all the time 2 1   No phlegm in chest -> Chest is full of phlegm 3 2   No chest tightness -> Chest feels very tight 1 0   No dyspnea for 1 flight stairs/hill -> Very dyspneic for 1 flight of stairs 3 2   No limitations for ADL at home -> Very limited with ADL at home 0 0   Confident leaving home -> Not at all confident leaving home 5 0   Sleep soundly -> Do not sleep soundly because of lung condition 2 4   Lots of Energy -> No energy at all 1 3    TOTAL Score (max 40)  17 12          Results for Ian Grimes, Ian Grimes (MRN [id number]) as of 07/19/2017 12:18  Ref. Range 11/12/2013 14:38 01/05/2016 10:28 07/11/2016 11:22 07/19/2017 10:35  FEV1-Pre Latest Units: L 2.68 2.47 2.61 2.32  FEV1-%Pred-Pre Latest Units: % 66 62 66 59  Results for Ian Grimes, Ian Grimes (MRN [id number]) as of 07/19/2017 12:18  Ref. Range 11/12/2013 14:38 01/05/2016 10:28 07/11/2016 11:22 07/19/2017 10:35  DLCO unc Latest Units: ml/min/mmHg 26.56 22.42 24.74 21.99  DLCO unc % pred Latest Units: % 85 72 79 70       has a past medical history of Anxiety, Depression, HA (headache), echocardiogram, Hyperlipidemia, Pleural effusion, Pulmonary emboli ([place]), Pulmonary nodule, and Schizo-affective psychosis ([place]).   reports that he has been smoking cigarettes.  He has a 22.00 pack-year smoking history. He has never used smokeless tobacco.  Past Surgical History:  Procedure Laterality Date  . ASCENDING AORTIC ROOT REPLACEMENT N/A 11/14/2013   Procedure: Value sparing Root replacement, ASCENDING AORTIC ROOT REPLACEMENT, circ Arrest;  Surgeon: Gaye Pollack, MD;  Location: [place];  Service: Open Heart Surgery;  Laterality: N/A;  . CARDIAC CATHETERIZATION    . INTRAOPERATIVE TRANSESOPHAGEAL ECHOCARDIOGRAM N/A 11/14/2013   Procedure: INTRAOPERATIVE TRANSESOPHAGEAL ECHOCARDIOGRAM;  Surgeon: Gaye Pollack, MD;  [facility];  Service: Open Heart Surgery;  Laterality: N/A;  . LEFT AND RIGHT HEART CATHETERIZATION WITH CORONARY ANGIOGRAM N/A 11/01/2013   Procedure: LEFT AND RIGHT HEART CATHETERIZATION WITH CORONARY ANGIOGRAM;  Surgeon: Josue Hector, MD;  [facility] LAB;  Service: Cardiovascular;  Laterality: N/A;  . rib injury      Allergies  Allergen Reactions  .  Bactrim Rash    Immunization History  Administered Date(s) Administered  . Influenza,inj,Quad PF,6+ Mos 01/17/2017    Family History  Problem Relation Age of Onset  . Heart disease Father   . Stroke Father   .  Cancer Father        lung  . Heart attack Father   . COPD Father   . Alcohol abuse Father   . Diabetes Paternal Grandfather   . COPD Paternal 78   . Asthma Paternal Aunt   . Stroke Paternal Uncle      Current Outpatient Medications:  .  acetaminophen (TYLENOL) 500 MG tablet, Take 500 mg every 6 (six) hours as needed by mouth (Pt takes 2-4 tabs prn.)., Disp: , Rfl:  .  aspirin 81 MG tablet, Take 81 mg by mouth daily., Disp: , Rfl:  .  hydrOXYzine (ATARAX/VISTARIL) 25 MG tablet, Take 1 tablet (25 mg total) by mouth 3 (three) times daily as needed., Disp: 90 tablet, Rfl: 2 .  metoprolol tartrate (LOPRESSOR) 25 MG tablet, Take 0.5 tablets (12.5 mg total) by mouth 2 (two) times daily., Disp: 60 tablet, Rfl: 4 .  montelukast (SINGULAIR) 10 MG tablet, , Disp: , Rfl:  .  pravastatin (PRAVACHOL) 80 MG tablet, TAKE 1 TABLET BY MOUTH EVERYDAY AT BEDTIME, Disp: , Rfl: 3 .  sertraline (ZOLOFT) 100 MG tablet, Take 1 tablet (100 mg total) by mouth daily., Disp: 30 tablet, Rfl: 2 .  Tiotropium Bromide Monohydrate (SPIRIVA RESPIMAT) 1.25 MCG/ACT AERS, Inhale 2 puffs daily into the lungs. (Patient not taking: Reported on 07/19/2017), Disp: 1 Inhaler, Rfl: 3 .  umeclidinium bromide (INCRUSE ELLIPTA) 62.5 MCG/INH AEPB, Inhale 1 puff into the lungs daily. (Patient not taking: Reported on 07/19/2017), Disp: 1 each, Rfl: 3   Review of Systems     Objective:   Physical Exam  Constitutional: He is oriented to person, place, and time. He appears well-developed and well-nourished. No distress.  HENT:  Head: Normocephalic and atraumatic.  Right Ear: External ear normal.  Left Ear: External ear normal.  Mouth/Throat: Oropharynx is clear and moist. No oropharyngeal exudate.  Eyes: Pupils are equal, round, and reactive to light. Conjunctivae and EOM are normal. Right eye exhibits no discharge. Left eye exhibits no discharge. No scleral icterus.  Neck: Normal range of motion. Neck supple. No JVD present. No  tracheal deviation present. No thyromegaly present.  Cardiovascular: Normal rate, regular rhythm and intact distal pulses. Exam reveals no gallop and no friction rub.  No murmur heard. Pulmonary/Chest: Effort normal and breath sounds normal. No respiratory distress. He has no wheezes. He has no rales. He exhibits no tenderness.  Abdominal: Soft. Bowel sounds are normal. He exhibits no distension and no mass. There is no tenderness. There is no rebound and no guarding.  Musculoskeletal: Normal range of motion. He exhibits no edema or tenderness.  Lymphadenopathy:    He has no cervical adenopathy.  Neurological: He is alert and oriented to person, place, and time. He has normal reflexes. No cranial nerve deficit. Coordination normal.  Skin: Skin is warm and dry. No rash noted. He is not diaphoretic. No erythema. No pallor.  Psychiatric: He has a normal mood and affect. His behavior is normal. Judgment and thought content normal.  Nursing note and vitals reviewed.  Vitals:   07/19/17 1202  BP: 114/70  Pulse: (!) 53  SpO2: 98%  Weight: 225 lb (102.1 kg)  Height: '5\' 9"'  (1.753 m)     Estimated body mass index  is 33.23 kg/m as calculated from the following:   Height as of this encounter: '5\' 9"'  (1.753 m).   Weight as of this encounter: 225 lb (102.1 kg).     Assessment:       ICD-10-CM   1. Stage 2 moderate COPD by GOLD classification ([place]) J44.9   2. Alpha-1-antitrypsin deficiency ([place]) E88.01   3. Smoking history Z87.891        Plan:       I am concerned that his lung function decline but this could merely reflect lack of inhalers  But glad you are feeling fine  Ongoing smoking can be a problem as you know  Not sure what happened and why you inhalers were sent by our office to the wrong pharmacy -  apologize for this confusion  Plan -Try to quit smoking to the extent possible by yourself -Restart inhaled anticholinergic Incruse once daily -Albuterol as  needed  Follow-up -In 3 months do spirometry pre-and postbronchodilator but no need for lung volumes or diffusion capacity -Return to see me in 3 months; will do CAT score at that time -Can discuss quit smoking strategies = If lung function continues to decline alpha-1 replacement is a discussion we should have    Dr. Brand Males, M.D., [facility].C.P Pulmonary and Critical Care Medicine Staff Physician, [place] Director - Interstitial Lung Disease  Program  Pulmonary [place] at [place], [place], [address]  Pager: [phone], If no answer or between  15:00h - 7:00h: call [phone] Telephone: [phone]

## 2017-07-19 NOTE — Progress Notes (Signed)
Spirometry and Dlco done today.  Dr. Brand Males, M.D., [facility].C.P Pulmonary and Critical Care Medicine Staff Physician, [place] Director - Interstitial Lung Disease  Program  Pulmonary [place] at [place], [place], [address]  Pager: [phone], If no answer or between  15:00h - 7:00h: call [phone] Telephone: [phone]

## 2017-07-19 NOTE — Patient Instructions (Signed)
ICD-10-CM   1. Stage 2 moderate COPD by GOLD classification ([place]) J44.9   2. Alpha-1-antitrypsin deficiency ([place]) E88.01   3. Smoking history Z87.891     I am concerned that his lung function decline but this could merely reflect lack of inhalers  But glad you are feeling fine  Ongoing smoking can be a problem as you know  Not sure what happened and why you inhalers were sent by our office to the wrong pharmacy -  apologize for this confusion  Plan -Try to quit smoking to the extent possible by yourself -Restart inhaled anticholinergic Incruse once daily -Albuterol as needed  Follow-up -In 3 months do spirometry pre-and postbronchodilator but no need for lung volumes or diffusion capacity -Return to see me in 3 months; will do CAT score at that time -Can discuss quit smoking strategies = If lung function continues to decline alpha-1 replacement is a discussion we should have

## 2017-10-11 DIAGNOSIS — J301 Allergic rhinitis due to pollen: Secondary | ICD-10-CM | POA: Diagnosis not present

## 2017-10-11 DIAGNOSIS — F411 Generalized anxiety disorder: Secondary | ICD-10-CM | POA: Diagnosis not present

## 2017-10-11 DIAGNOSIS — I10 Essential (primary) hypertension: Secondary | ICD-10-CM | POA: Diagnosis not present

## 2017-10-11 DIAGNOSIS — Z6832 Body mass index (BMI) 32.0-32.9, adult: Secondary | ICD-10-CM | POA: Diagnosis not present

## 2017-10-11 DIAGNOSIS — E782 Mixed hyperlipidemia: Secondary | ICD-10-CM | POA: Diagnosis not present

## 2017-10-19 ENCOUNTER — Ambulatory Visit: Payer: Self-pay | Admitting: Internal Medicine

## 2017-11-13 ENCOUNTER — Ambulatory Visit (INDEPENDENT_AMBULATORY_CARE_PROVIDER_SITE_OTHER): Payer: Medicare Other | Admitting: Otolaryngology

## 2017-11-13 DIAGNOSIS — J310 Chronic rhinitis: Secondary | ICD-10-CM | POA: Diagnosis not present

## 2017-11-13 DIAGNOSIS — J343 Hypertrophy of nasal turbinates: Secondary | ICD-10-CM

## 2017-11-13 DIAGNOSIS — R51 Headache: Secondary | ICD-10-CM

## 2017-11-14 ENCOUNTER — Other Ambulatory Visit (INDEPENDENT_AMBULATORY_CARE_PROVIDER_SITE_OTHER): Payer: Self-pay | Admitting: Otolaryngology

## 2017-11-14 DIAGNOSIS — J329 Chronic sinusitis, unspecified: Secondary | ICD-10-CM

## 2017-11-21 ENCOUNTER — Ambulatory Visit (INDEPENDENT_AMBULATORY_CARE_PROVIDER_SITE_OTHER): Payer: Medicare Other | Admitting: Internal Medicine

## 2017-11-21 ENCOUNTER — Encounter: Payer: Self-pay | Admitting: Internal Medicine

## 2017-11-21 VITALS — BP 120/70 | HR 63 | Ht 69.0 in | Wt 225.0 lb

## 2017-11-21 DIAGNOSIS — E8801 Alpha-1-antitrypsin deficiency: Secondary | ICD-10-CM

## 2017-11-21 DIAGNOSIS — Z23 Encounter for immunization: Secondary | ICD-10-CM | POA: Diagnosis not present

## 2017-11-21 DIAGNOSIS — J449 Chronic obstructive pulmonary disease, unspecified: Secondary | ICD-10-CM | POA: Diagnosis not present

## 2017-11-21 DIAGNOSIS — Z87891 Personal history of nicotine dependence: Secondary | ICD-10-CM | POA: Diagnosis not present

## 2017-11-21 LAB — PULMONARY FUNCTION TEST
DL/VA % pred: 77 %
DL/VA: 3.58 ml/min/mmHg/L
DLCO UNC % PRED: 61 %
DLCO unc: 18.96 ml/min/mmHg
FEF 25-75 Pre: 1.41 L/sec
FEF2575-%Pred-Pre: 39 %
FEV1-%Pred-Pre: 59 %
FEV1-PRE: 2.31 L
FEV1FVC-%Pred-Pre: 90 %
FEV6-%Pred-Pre: 67 %
FEV6-Pre: 3.24 L
FEV6FVC-%Pred-Pre: 102 %
FVC-%PRED-PRE: 66 %
FVC-Pre: 3.28 L
PRE FEV1/FVC RATIO: 71 %
Pre FEV6/FVC Ratio: 99 %

## 2017-11-21 NOTE — Patient Instructions (Addendum)
ICD-10-CM   1. Stage 2 moderate COPD by GOLD classification ([place]) J44.9   2. Alpha-1-antitrypsin deficiency ([place]) E88.01   3. Smoking history Z87.891   4. Need for immunization against influenza Z23     COPD stable without any flareup but you are having progressive loss of lung function because of both smoking and alpha-1 deficiency  PLAN  -Smoking: My CMA will call your psychiatrist and see if she can prescribe your Chantix or to help you quit but we understand that in the presence of schizoaffective disorder that it will be extremely difficult for you to quit smoking -Alpha-1 deficiency: My CMA will initiate paperwork to start you on Prolastin given the progressive loss of lung function in the last 4 years steadily -COPD: Even though your symptom score is low given loss of lung function change Incruse to [employer] daily - fLu shot: please have flu shot 11/21/2017   Followup 2 months with APP for above - to ensure prolastin start has gone well 6 months with Dr Chase Caller

## 2017-11-21 NOTE — Progress Notes (Signed)
IOV 06/12/2013  Chief Complaint  Patient presents with  . Advice Only    Referred for pulmonary nodule.  Had CT chest at the end of March.  Has no breathing complaints at this time.    47 year old smoker with significant psychiatric history but a fairly good historian also with anxiety, hyperlipidemia and smoking. He was admitted 05/06/2013 with 3 week history of dyspnea. Was diagnosed with bilateral pulmonary embolism. Treated by the hospitalist service. He was on 2 L nasal cannula with a respirator 30 per minute at the time of admission. He was discharged 05/08/2013 on xarelto. Since then he has returned to baseline health. He is followed up with his primary care physician. He says that for the last 1 week he is not taking his xarelto because his primary care physician apparently told him that the refills are actually once a month. He is also confused that his primary care physician has been regarding that he is on Coumadin but he insists that he wants on xarelto up until one week ago and that there was no change in plan to take any other medication. He had tolerated xarelto well without any problems of bleeding or compliance issues.  CT scan of the chest at that time should 8 mm subpleural nodule in the lateral segment of the right middle lobe and that is the main reason for referral today  Other she smoking: He continues to smoke and is unable to quit.  reports that he has been smoking Cigarettes.  He has a 22 pack-year smoking history. He has never used smokeless tobacco.  IMPRESSION:   CT chest 05/06/13 Segmental pulmonary emboli within all lobes. Overall clot burden is  moderate.  8 x 6 mm triangular subpleural nodule in the lateral right middle  lobe, corresponding to the radiographic abnormality, favored to  reflect a benign subpleural lymph node but technically  indeterminate. Given patient's high risk for primary bronchogenic  neoplasm, initial follow-up CT chest is  suggested in 6 months.  Faint ground-glass nodular opacities in the left upper lobe/lingula,  possibly infectious/inflammatory.  Trace right pleural effusion.  Nodule recommendation follows the consensus statement: Guidelines  for Management of Small Pulmonary Nodules Detected on CT Scans: A  Statement from the [employer] as published in Radiology  2005; 237:395-400.  Critical value/emergent results were called by telephone at the time  of interpretation on 05/06/2013 at 9:12 AM to [facility], who  verbally acknowledged these results.  Electronically Signed  By: Julian Hy M.D.  On: 05/06/2013 09:35         OV 08/19/2013  Chief Complaint  Patient presents with  . Follow-up    Pt states breathing is unchanged. Pt states he only coughs to clear his throat. Pt states he constant sinus headaches. Pt denies SOB and CP.     Folllowup   Smoking: still smokes. Struggling to q uit. Depression makes it harder to quit  PE: back on xaretlo 62m per day after med review. He has no problems. MOwed yard 4h and no dyspnea   Lung nodule: has resolved June 2015 CT chest but new finding: Ascending Aorta Aneurumsm 4.5cm on CT chest   IMPRESSION:  1. No suspicious pulmonary nodules. Subpleural density previously  noted at the right lung base has resolved, likely atelectasis or  sequela of pulmonary embolism demonstrated on prior examination.  2. Mild centrilobular and paraseptal emphysema.  3. Ascending aortic aneurysm appears mildly enlarged compared with  the  prior study. Of note, previously demonstrated pulmonary embolism  is not addressed on this noncontrast study.  Electronically Signed  By: Camie Patience M.D.  On: 08/07/2013 12:30     OV 11/19/2014  Chief Complaint  Patient presents with  . Follow-up    Pt c/o mild DOE, prod cough with clear mucus. Pt deneis CP/tightness.     SMoking:  reports that he has been smoking Cigarettes.  He has a 22 pack-year smoking  history. He has never used smokeless tobacco. CT scan of the chest in 2015 did show some subclinical emphysema  Pulmonary embolism: This was diagnosed in the spring of 2015. He completed one year Xarelto therapy earlier this year and I believe either under the instructions of primary care physician or cardiologist he came off therapy. He has since been referred here to follow-up. He feels fine without any chest pain hemoptysis syncope cough or dyspnea. The only amount of dyspnea he has this when he initially starts walking but as he warms up he does not have any dyspnea. His effort tolerance is good. Cardiologist wondered about hypercoagulable workup.     OV 06/01/2015  Chief Complaint  Patient presents with  . Follow-up    Pt here for 6 months f/u. Pt states his breathing is unchanged since last OV. Pt c/o prod cough with clear thin mucus and chest tightness at random times.     47 year old male  0- has subclinical emphysema: CT chest June 2015 without any nodules. He reports ongoing shortness of breath with exertion for walking over a block on level ground. This is stable. Insidious onset for the last 3-4 years. Associated with being overweight. No  Pulmonary function tests this visit  - Alpha 1 genetic deficiency: Last visit I checked his alpha-1. His level was 76. Phenotype was  SZ and abnormal. Shed results with him again  - Pulmonary embolism: Completed one year of therapy summer 2016 since then no clinical evidence of pulmonary embolism or DVT. He continues on aspirin therapy. D-dimer at last visit was normal suggesting recurrence risk is low  - Smoking: He continues to smoke. He knows he needs to quit but is finding it difficult  Past medical history: He has chronic headaches. He canceled an MRI due to concern of prosthesis and acyanotic. He'll be with his neurologist again    has a past medical history of Schizo-affective psychosis ([place]); Anxiety; Depression; HA (headache);  Hyperlipidemia; Pulmonary emboli ([place]); Pulmonary nodule; Pleural effusion; and echocardiogram.   reports that he has been smoking Cigarettes.  He has a 22 pack-year smoking history. He has never used smokeless tobacco.  OV 01/05/2016  Chief Complaint  Patient presents with  . Follow-up    Pt here after PFT. Pt denies changes in SOB since last OV. Pt c/o prod cough with clear mucus. Pt states he is getting over a cold. Pt denies CP/tightness and f/c/s.     47 year old male with smoking history, emphysema and history of pulmonary embolism status post completion of anticoagulation. This in the background setting of schizoaffective disorder and taking multiple psychiatric drugs  Emphysema associated with alpha-1 SZ: He says his breathing is stable. He has class II dyspnea and exertion and he notices this when walking up a steep incline. It is relieved by rest. This no associated chest pain. The cough is associated as mild and dry. No diaphoresis or palpitations. Overall symptoms are stable compared to 2 years ago. However he did have pulmonary function test that is  documented below and shows decline both in the prebronchodilator elements and the post bronchodilator elements and diffusion capacity. His alpha-1 deficiency. He is aware of this. I'm not so sure that he has to talk to his family members about it.  Smoking history: He continues to smoke. He says it helps on his notes. He says it'll be very difficult to quit. His psychiatrist is Dr. Sima Matas in [place]. He says he willing to take his help in trying to quit smoking.  Pulmonary Embolism: Last taken anticoagulation agent months ago. D-dimer was normal. He is on daily aspirin therapy. He says he is compliant with aspirin. No symptoms of recurrence.   OV 04/11/2016  Chief Complaint  Patient presents with  . Follow-up    Pt. states he feels like his breathing has remained unchanged,He is still coughing but it has been better  But he  still has sinus headaches, Denies chest tightness,fever   Follow-up  History of pulmonary embolism: He is on baby aspirin prophylaxis. He says he is compliant with it. No clinical evidence of recurrence based on history this visit  Alpha-1 antitrypsin deficiency SZ with low levels with emphysema: He is taking Spiriva . He says he spoke to his aunt about his alpha-1 and he says the whole family knows about the alpha-1 deficiency but they failed to inform him. He is unsure whether he should take replacement and he wants to read some information about it. However he has not quit smoking. Overall COPD stable with a cat score of 17   smoking history: He continues to smoke. He is unable to quit. Now he tells me that his psychiatrist is actually Dr. Harrington Challenger and when he met with her in [place] she said that she would not be the right MD  to help him with quitting smoking.  He understands the need to quit     OV 07/11/2016  Chief Complaint  Patient presents with  . Follow-up    Pt here after PFT. Pt denies change in breathing since last OV, denies cough and f/c/s. Pt states he had an episode of right sided chest tightness that lasted 20 min, pt states he was resting at the time of presentation.     Follow-up stage II COPD alpha-1 SZ the with low levels : Overall he's stable. He is currently not on any scheduled inhaler therapy due to insurance reasons and confusion as to which inhaler would be covered.. Nevertheless he feels stable and well. He had animals spirometry because of his alpha-1 and it is actually improved compared to last year.  In terms of smoking: He continues to smoke and difficult to quit  Pulmonary embolism history: He continues on baby aspirin and has not had any problems   OV 01/17/2017  Chief Complaint  Patient presents with  . Follow-up    Pt states that he is doing better. Denies any real complaints of cough, SOB, or CP.    Follow-up multiple issues  Gold stage II  COPD alpha-1 SZ with low levels: There is a 59-monthfollow-up.  Symptom wise he is stable.  His COPD CAD score below has improved and this is without any inhaler therapy.  I gave him sample inhalers he said it did not make any difference.  He did not price these inhalers.  He is not even using albuterol as needed.  I reeducated him about the need for maintenance.  We decided to keep it simple and just use Spiriva  Smoking: He has  associated depression and he finds it hard to quit.  According to the primary care notes the depression itself is stable  Pulmonary embolism history: He continues on baby aspirin has not had any problems   OV 07/19/2017  Chief Complaint  Patient presents with  . Follow-up    PFT done today. Pt states he has been doing good since last visit. Denies any real complaints.   Gold stage II COPD Alpha-1 deficiency SZ\smoking  Presents for routine follow-up.  Overall feels stable.  Pulmonary function test shows a decline in lung function although diffusion capacity is the same.  He still continues to smoke.  In talking to him it appears as anticholinergics was sent to wrong pharmacy and therefore is not on any anticholinergic.  He knows he is to quit smoking.  There are no other new issues.   OV 11/21/2017  Subjective:  Patient ID: Ian Grimes, male , DOB: [birth year] , age 20 y.o. , MRN: [id number] , ADDRESS: [address] [place] [address]   11/21/2017 -   Chief Complaint  Patient presents with  . Follow-up    PFT performed today. Pt states he has been doing well since last visit and denies any complaints.   Follow-up Gold stage II COPD, smoking, alpha-1 antitrypsin SZ deficiency  HPI Ian Grimes 47 y.o. -presents for follow-up of the above 3 problems.  In the interim he has restarted his anticholinergic inhalers Incruse.  His symptom scores are slightly better COPD CAT score 10.  He is not on any other inhaler.  His lung function shows  stability compared to May 2019 in terms of FEV1 but his DLCO shows a decline as shown below.  Review of his pulmonary function test in the last 4 years shows continued and progressive decline.  This is on account of his alpha-1 and ongoing smoking.  He tells me that in an ideal world he would like to quit but given his schizoaffective disorder he has been unable to quit.  Without cigarettes he gets very nervous.  He did not tolerate Zyban many years ago.  He sees a psychiatrist once a month because his schizoaffective disorder requires intense monitoring.  He does not have any firearms.  At this point in time he is not suicidal or homicidal.  He will have a flu shot today.      CAT COPD Symptom & Quality of Life Score (GSK trademark) 0 is no burden. 5 is highest burden 04/11/2016  01/17/2017  11/21/2017    Never Cough -> Cough all the time '2 1 1  ' No phlegm in chest -> Chest is full of phlegm '3 2 3  ' No chest tightness -> Chest feels very tight 1 0 1  No dyspnea for 1 flight stairs/hill -> Very dyspneic for 1 flight of stairs '3 2 2  ' No limitations for ADL at home -> Very limited with ADL at home 0 0 0  Confident leaving home -> Not at all confident leaving home 5 0 0  Sleep soundly -> Do not sleep soundly because of lung condition '2 4 2  ' Lots of Energy -> No energy at all '1 3 1  ' TOTAL Score (max 40)  '17 12 10      ' Results for Yablonski, Ian Grimes (MRN [id number]) as of 11/21/2017 10:08  Ref. Range 11/12/2013 14:38 01/05/2016 10:28 07/11/2016 11:22 07/19/2017 10:35 11/21/2017 08:57  FEV1-Pre Latest Units: L 2.68 2.47 2.61 2.32 2.31  FEV1-%Pred-Pre Latest  Units: % 66 62 66 59 59   Results for Robin, Ian Grimes (MRN [id number]) as of 11/21/2017 10:08  Ref. Range 11/12/2013 14:38 01/05/2016 10:28 07/11/2016 11:22 07/19/2017 10:35 11/21/2017 08:57  DLCO unc Latest Units: ml/min/mmHg 26.56 22.42 24.74 21.99 18.96  DLCO unc % pred Latest Units: % 85 72 79 70 61    ROS - per HPI     has a past medical  history of Anxiety, Depression, HA (headache), echocardiogram, Hyperlipidemia, Pleural effusion, Pulmonary emboli ([place]), Pulmonary nodule, and Schizo-affective psychosis ([place]).   reports that he has been smoking cigarettes. He has a 22.00 pack-year smoking history. He has never used smokeless tobacco.  Past Surgical History:  Procedure Laterality Date  . ASCENDING AORTIC ROOT REPLACEMENT N/A 11/14/2013   Procedure: Value sparing Root replacement, ASCENDING AORTIC ROOT REPLACEMENT, circ Arrest;  Surgeon: Gaye Pollack, MD;  Location: [facility];  Service: Open Heart Surgery;  Laterality: N/A;  . CARDIAC CATHETERIZATION    . INTRAOPERATIVE TRANSESOPHAGEAL ECHOCARDIOGRAM N/A 11/14/2013   Procedure: INTRAOPERATIVE TRANSESOPHAGEAL ECHOCARDIOGRAM;  Surgeon: Gaye Pollack, MD;  Location[facility];  Service: Open Heart Surgery;  Laterality: N/A;  . LEFT AND RIGHT HEART CATHETERIZATION WITH CORONARY ANGIOGRAM N/A 11/01/2013   Procedure: LEFT AND RIGHT HEART CATHETERIZATION WITH CORONARY ANGIOGRAM;  Surgeon: Josue Hector, MD;  Location: [facility] CATH LAB;  Service: Cardiovascular;  Laterality: N/A;  . rib injury      Allergies  Allergen Reactions  . Bactrim Rash    Immunization History  Administered Date(s) Administered  . Influenza,inj,Quad PF,6+ Mos 01/17/2017    Family History  Problem Relation Age of Onset  . Heart disease Father   . Stroke Father   . Cancer Father        lung  . Heart attack Father   . COPD Father   . Alcohol abuse Father   . Diabetes Paternal Grandfather   . COPD Paternal 6   . Asthma Paternal Aunt   . Stroke Paternal Uncle      Current Outpatient Medications:  .  acetaminophen (TYLENOL) 500 MG tablet, Take 500 mg every 6 (six) hours as needed by mouth (Pt takes 2-4 tabs prn.)., Disp: , Rfl:  .  aspirin 81 MG tablet, Take 81 mg by mouth daily., Disp: , Rfl:  .  hydrOXYzine (ATARAX/VISTARIL) 25 MG tablet, Take 1 tablet (25 mg total) by mouth 3 (three) times daily as  needed., Disp: 90 tablet, Rfl: 2 .  metoprolol tartrate (LOPRESSOR) 25 MG tablet, Take 0.5 tablets (12.5 mg total) by mouth 2 (two) times daily., Disp: 60 tablet, Rfl: 4 .  pravastatin (PRAVACHOL) 80 MG tablet, TAKE 1 TABLET BY MOUTH EVERYDAY AT BEDTIME, Disp: , Rfl: 3 .  sertraline (ZOLOFT) 100 MG tablet, Take 100 mg by mouth daily., Disp: , Rfl:  .  Tiotropium Bromide Monohydrate (SPIRIVA RESPIMAT) 1.25 MCG/ACT AERS, Inhale 2 puffs daily into the lungs., Disp: 1 Inhaler, Rfl: 3 .  umeclidinium bromide (INCRUSE ELLIPTA) 62.5 MCG/INH AEPB, Inhale 1 puff into the lungs daily., Disp: 90 each, Rfl: 0 .  sertraline (ZOLOFT) 100 MG tablet, Take 1 tablet (100 mg total) by mouth daily., Disp: 30 tablet, Rfl: 2      Objective:   Vitals:   11/21/17 0955  BP: 120/70  Pulse: 63  SpO2: 97%  Weight: 225 lb (102.1 kg)  Height: '5\' 9"'  (1.753 m)    Estimated body mass index is 33.23 kg/m as calculated from the following:   Height  as of this encounter: '5\' 9"'  (1.753 m).   Weight as of this encounter: 225 lb (102.1 kg).    Filed Weights   11/21/17 0955  Weight: 225 lb (102.1 kg)     Physical Exam  General Appearance:    Alert, cooperative, no distress, appears stated age - no. Looks older , sitting on - chair , Deconditioned looking - mildly yes  Head:    Normocephalic, without obvious abnormality, atraumatic  Eyes:    PERRL, conjunctiva/corneas clear,  Ears:    Normal TM's and external ear canals, both ears  Nose:   Nares normal, septum midline, mucosa normal, no drainage    or sinus tenderness. OXYGEN ON  - no . Patient is @ RA   Throat:   Lips, mucosa, and tongue normal; teeth and gums normal. Cyanosis on lips - no  Neck:   Supple, symmetrical, trachea midline, no adenopathy;    thyroid:  no enlargement/tenderness/nodules; no carotid   bruit or JVD  Back:     Symmetric, no curvature, ROM normal, no CVA tenderness  Lungs:     Distress - no , Wheeze no, Barrell Chest - no, Purse lip  breathing - no, Crackles - no   Chest Wall:    No tenderness or deformity. Scars in chest yes central from aortic surgery   Heart:    Regular rate and rhythm, S1 and S2 normal, no rub   or gallop, Murmur - no  Breast Exam:    NOT DONE  Abdomen:     Soft, non-tender, bowel sounds active all four quadrants,    no masses, no organomegaly  Genitalia:   NOT DONE  Rectal:   NOT DONE  Extremities:   Extremities normal, atraumatic, Clubbing - n, Edema - no  Pulses:   2+ and symmetric all extremities  Skin:   Stigmata of Connective Tissue Disease - yes  Lymph nodes:   Cervical, supraclavicular, and axillary nodes normal  Psychiatric:  Neurologic:   nlrmal  CAm-ICU - neg, Alert and Oriented x 3 - yes, Moves all 4s - neg, Speech - normal, Cognition - normal           Assessment:       ICD-10-CM   1. Stage 2 moderate COPD by GOLD classification (HCC) J44.9   2. Alpha-1-antitrypsin deficiency ([place]) E88.01   3. Smoking history Z87.891   4. Need for immunization against influenza Z23        Plan:     Patient Instructions     ICD-10-CM   1. Stage 2 moderate COPD by GOLD classification ([place]) J44.9   2. Alpha-1-antitrypsin deficiency ([place]) E88.01   3. Smoking history Z87.891   4. Need for immunization against influenza Z23     COPD stable without any flareup but you are having progressive loss of lung function because of both smoking and alpha-1 deficiency  PLAN  -Smoking: My CMA will call your psychiatrist and see if she can prescribe your Chantix or to help you quit but we understand that in the presence of schizoaffective disorder that it will be extremely difficult for you to quit smoking -Alpha-1 deficiency: My CMA will initiate paperwork to start you on Prolastin given the progressive loss of lung function in the last 4 years steadily -COPD: Even though your symptom score is low given loss of lung function change Incruse to [employer] daily - fLu shot: please have flu shot 11/21/2017     Followup 2 months with APP for  above - to ensure prolastin start has gone well 6 months with Dr Gaye Alken    Dr. Brand Males, M.D., F.C.C.P,  Pulmonary and Critical Care Medicine Staff Physician, [place] Director - Interstitial Lung Disease  Program  Pulmonary [place] at [place], [place], [address]  Pager: [phone], If no answer or between  15:00h - 7:00h: call [phone] Telephone: [phone]  10:27 AM 11/21/2017

## 2017-11-21 NOTE — Progress Notes (Signed)
Spirometry and Dlco done today. 

## 2017-12-05 ENCOUNTER — Ambulatory Visit (HOSPITAL_COMMUNITY)
Admission: RE | Admit: 2017-12-05 | Discharge: 2017-12-05 | Disposition: A | Discharge status: Home / Self Care | Payer: Medicare Other | Source: Ambulatory Visit | Attending: Otolaryngology | Admitting: Otolaryngology

## 2017-12-05 DIAGNOSIS — J329 Chronic sinusitis, unspecified: Secondary | ICD-10-CM | POA: Diagnosis not present

## 2017-12-18 ENCOUNTER — Ambulatory Visit (INDEPENDENT_AMBULATORY_CARE_PROVIDER_SITE_OTHER): Payer: Medicare Other | Admitting: Otolaryngology

## 2017-12-18 DIAGNOSIS — J343 Hypertrophy of nasal turbinates: Secondary | ICD-10-CM | POA: Diagnosis not present

## 2017-12-18 DIAGNOSIS — J320 Chronic maxillary sinusitis: Secondary | ICD-10-CM

## 2017-12-18 DIAGNOSIS — J342 Deviated nasal septum: Secondary | ICD-10-CM

## 2017-12-18 DIAGNOSIS — J322 Chronic ethmoidal sinusitis: Secondary | ICD-10-CM | POA: Diagnosis not present

## 2017-12-22 ENCOUNTER — Other Ambulatory Visit: Payer: Self-pay | Admitting: Otolaryngology

## 2018-01-15 ENCOUNTER — Other Ambulatory Visit: Payer: Self-pay | Admitting: Internal Medicine

## 2018-01-19 NOTE — Progress Notes (Signed)
Chart reviewed with Dr Ambrose Pancoast, pt with hx COPD/emphysema, refuses to quit smoking and non-compliant with inhalers. Patient will be better served being done at [facility] per Dr Ambrose Pancoast. Heather at Dr Deeann Saint office notified.

## 2018-01-21 NOTE — Progress Notes (Signed)
@Patient  ID: Ian Grimes, male    DOB: [birth year], 47 y.o.   MRN: [id number]  Chief Complaint  Patient presents with  . Follow-up    COPD    Referring provider: Claretta Fraise, MD  HPI:  47 year old male current every day smoker followed in our office for alpha-1 antitrypsin deficiency, gold stage II COPD.  PMH: Schizophrenia, depression Smoker/ Smoking History: Current Smoker, patient also currently vaping at this time Maintenance:  Anoro, Prolastin?  Pt of: Dr. Chase Caller  01/22/2018  - Visit   47 year old male current every day smoker following up with our office today for 28-month follow-up.  Patient reports he feels good his breathing is better than it has ever been.  Patient reports that he is cut down significantly on smoking is down to about 3 cigarettes a day.  Unfortunately patient has been able to wean his cigarettes because he is started vaping.  Patient reports he probably vapes over 50 times a day.  Patient's device that he has in hand is an e-cigarette device.  Patient reports that he uses his vape within minutes of waking up.  Patient has not heard anything regarding Prolastin.  Patient is still interested in starting Prolastin.  Patient continues to have follow-up with his therapist.  Therapist did recommend Chantix but is not a psychiatrist that will not prescribe it.  Patient would like to see a psychiatrist.  Patient reports that he needs a referral to get established with one.  MMRC - Breathlessness Score 1 - I get short of breath when hurrying on level ground or walking up a slight hill   Tests:    FENO:  No results found for: NITRICOXIDE  PFT: PFT Results Latest Ref Rng & Units 11/21/2017 07/19/2017 07/11/2016 01/05/2016 11/12/2013  FVC-Pre L 3.28 3.31 3.72 3.45 3.68  FVC-Predicted Pre % 66 66 74 68 72  FVC-Post L - - - 3.66 4.08  FVC-Predicted Post % - - - 72 80  Pre FEV1/FVC % % 71 70 70 72 73  Post FEV1/FCV % % - - - 74 77  FEV1-Pre L 2.31 2.32  2.61 2.47 2.68  FEV1-Predicted Pre % 59 59 66 62 66  FEV1-Post L - - - 2.69 3.16  DLCO UNC% % 61 70 79 72 85  DLCO COR %Predicted % 77 88 96 93 93  TLC L - - - 6.46 -  TLC % Predicted % - - - 95 -  RV % Predicted % - - - 148 -    Imaging: No results found.  Chart Review:    Specialty Problems      Pulmonary Problems   Snoring   Stage 2 moderate COPD by GOLD classification (HCC)   Alpha-1-antitrypsin deficiency (HCC)   Sinobronchitis      Allergies  Allergen Reactions  . Bactrim Rash    Immunization History  Administered Date(s) Administered  . Influenza,inj,Quad PF,6+ Mos 01/17/2017, 11/21/2017    Past Medical History:  Diagnosis Date  . Anxiety   . Depression   . HA (headache)   . Hx of echocardiogram    Echo (10/15):  Mild LVH, EF 60-65%, no RWMA, trivial AI, mild LAE  . Hyperlipidemia   . Pleural effusion   . Pulmonary emboli ([place])   . Pulmonary nodule    follow up CT 6 months (due 9/15)  . Schizo-affective psychosis ([place])     Tobacco History: Social History   Tobacco Use  Smoking Status Current Some Day Smoker  .  Packs/day: 1.00  . Years: 22.00  . Pack years: 22.00  . Types: Cigarettes  Smokeless Tobacco Never Used   Ready to quit: No Counseling given: Yes   Smoking assessment and cessation counseling  Patient currently smoking: 2 to 3 cigarettes a day, over 50 times a day vaping I have advised the patient to quit/stop smoking as soon as possible due to high risk for multiple medical problems.  It will also be very difficult for [place] to manage patient's  respiratory symptoms and status if we continue to expose her lungs to a known irritant.  We do not advise e-cigarettes as a form of stopping smoking.  Patient is not willing to quit smoking.  Patient has not set quit date.  I have advised the patient that we can assist and have options of nicotine replacement therapy, provided smoking cessation education today, provided smoking cessation  counseling, and provided cessation resources.  Suggested nicotine replacement for patient.  Will refer to psychiatry.  Psychiatry could further evaluate for Chantix use.  Follow-up next office visit office visit for assessment of smoking cessation.  Smoking cessation counseling advised for: 6 min   Outpatient Encounter Medications as of 01/22/2018  Medication Sig  . acetaminophen (TYLENOL) 500 MG tablet Take 500 mg every 6 (six) hours as needed by mouth (Pt takes 2-4 tabs prn.).  Marland Kitchen hydrOXYzine (ATARAX/VISTARIL) 25 MG tablet Take 1 tablet (25 mg total) by mouth 3 (three) times daily as needed.  . metoprolol tartrate (LOPRESSOR) 25 MG tablet Take 0.5 tablets (12.5 mg total) by mouth 2 (two) times daily.  . pravastatin (PRAVACHOL) 80 MG tablet TAKE 1 TABLET BY MOUTH EVERYDAY AT BEDTIME  . umeclidinium-vilanterol (ANORO ELLIPTA) 62.5-25 MCG/INH AEPB Inhale 1 puff into the lungs daily.  Marland Kitchen omeprazole (PRILOSEC) 20 MG capsule Take 1 capsule (20 mg total) by mouth daily.  . sertraline (ZOLOFT) 100 MG tablet Take 1 tablet (100 mg total) by mouth daily.  . [DISCONTINUED] aspirin 81 MG tablet Take 81 mg by mouth daily.   No facility-administered encounter medications on file as of 01/22/2018.      Review of Systems  Review of Systems  Constitutional: Negative for activity change, chills, fatigue, fever and unexpected weight change.  HENT: Negative for postnasal drip and rhinorrhea.   Eyes: Negative.   Respiratory: Negative for cough, shortness of breath and wheezing.   Cardiovascular: Negative for chest pain and palpitations.  Gastrointestinal: Negative for constipation, diarrhea, nausea and vomiting.  Endocrine: Negative.   Musculoskeletal: Negative.   Skin: Negative.   Neurological: Negative for dizziness and headaches.  Psychiatric/Behavioral: Positive for dysphoric mood. The patient is not nervous/anxious.   All other systems reviewed and are negative.    Physical Exam  BP (!)  122/98 (BP Location: Left Arm, Cuff Size: Normal)   Pulse 60   Ht 6' (1.829 m)   Wt 227 lb (103 kg)   SpO2 96%   BMI 30.79 kg/m   Wt Readings from Last 5 Encounters:  01/22/18 227 lb (103 kg)  11/21/17 225 lb (102.1 kg)  07/19/17 225 lb (102.1 kg)  01/17/17 223 lb (101.2 kg)  07/11/16 220 lb (99.8 kg)     Physical Exam  Constitutional: He is oriented to person, place, and time and well-developed, well-nourished, and in no distress. No distress.  HENT:  Head: Normocephalic and atraumatic.  Right Ear: Hearing, tympanic membrane, external ear and ear canal normal.  Left Ear: Hearing, tympanic membrane, external ear and ear canal normal.  Nose: Nose normal. Right sinus exhibits no maxillary sinus tenderness and no frontal sinus tenderness. Left sinus exhibits no maxillary sinus tenderness and no frontal sinus tenderness.  Mouth/Throat: Uvula is midline and oropharynx is clear and moist. No oropharyngeal exudate.  Eyes: Pupils are equal, round, and reactive to light.  Neck: Normal range of motion. Neck supple.  Cardiovascular: Normal rate, regular rhythm and normal heart sounds.  Pulmonary/Chest: Effort normal and breath sounds normal. No accessory muscle usage. No respiratory distress. He has no decreased breath sounds. He has no wheezes. He has no rhonchi.  Abdominal: Soft. Bowel sounds are normal. There is no tenderness.  Musculoskeletal: Normal range of motion.  Lymphadenopathy:    He has no cervical adenopathy.  Neurological: He is alert and oriented to person, place, and time. Gait normal.  Skin: Skin is warm and dry. He is not diaphoretic. No erythema.  Psychiatric: Memory, affect and judgment normal. His mood appears anxious. He exhibits a depressed mood. He expresses no homicidal and no suicidal ideation. He expresses no suicidal plans and no homicidal plans.  + Patient anxious regarding stopping smoking  Nursing note and vitals reviewed.     Lab Results:  CBC      Component Value Date/Time   WBC 16.2 (H) 12/02/2015 1341   WBC 10.9 (H) 11/18/2013 0715   RBC 4.95 12/02/2015 1341   RBC 3.11 (L) 11/18/2013 0715   HGB 16.2 12/02/2015 1341   HCT 47.7 12/02/2015 1341   PLT 389 (H) 12/02/2015 1341   MCV 96 12/02/2015 1341   MCH 32.7 12/02/2015 1341   MCH 33.4 11/18/2013 0715   MCHC 34.0 12/02/2015 1341   MCHC 34.3 11/18/2013 0715   RDW 13.8 12/02/2015 1341   LYMPHSABS 3.9 (H) 12/02/2015 1341   MONOABS 0.6 10/28/2013 1141   EOSABS 0.4 12/02/2015 1341   BASOSABS 0.1 12/02/2015 1341    BMET    Component Value Date/Time   NA 139 12/02/2015 1341   K 5.3 (H) 12/02/2015 1341   CL 97 12/02/2015 1341   CO2 25 12/02/2015 1341   GLUCOSE 81 12/02/2015 1341   GLUCOSE 85 11/18/2013 0715   BUN 9 12/02/2015 1341   CREATININE 0.80 12/02/2015 1341   CREATININE 1.13 07/17/2012 1201   CALCIUM 10.0 12/02/2015 1341   GFRNONAA 108 12/02/2015 1341   GFRNONAA 80 07/17/2012 1201   GFRAA 125 12/02/2015 1341   GFRAA >89 07/17/2012 1201    BNP No results found for: BNP  ProBNP No results found for: PROBNP    Assessment & Plan:   47 year old male patient following up with our office today.  Unfortunately patient still has not started her his Prolastin.  This may be regarding the fact that he still smoking.  Unfortunately he is also vaping.  This puts him at a higher risk of EVALI.  Discussed extensively with patient that if he has decreased oxygen saturations less than 95%, has any respiratory distress, has increased cough that he needs to present to our office or emergency room due to the fact that he is vaping.  We will follow-up with Raquel Sarna, Dr. Golden Pop nurse regarding the patient's Prolastin.  Patient to follow-up with Dr. Chase Caller in 1 month for his first available to ensure that we get started on Prolastin and take further evaluate smoking cessation.  GERD (gastroesophageal reflux disease)  Acid reflux GERD management: >>>Avoid laying flat  until 2 hours after meals >>>Elevate head of the bed including entire chest >>>Reduce size of meals and  amount of fat, acid, spices, caffeine and sweets >>>If you are smoking, Please stop! >>>Decrease alcohol consumption >>>Work on maintaining a healthy weight with normal BMI   Omeprazole 20 mg tablet  >>>Please take 1 tablet daily 15 minutes to 30 minutes before your first meal of the day as well as before your other medications >>>Try to take at the same time each day >>>take this medication daily   Alpha-1-antitrypsin deficiency ([place]) Follow-up with Dr. Chase Caller in 1 month or first available >>> Follow-up regarding Prolastin  If you have worsening shortness of breath, fatigue, cough you need to present to the emergency room or office quickly because this can be due to your vaping  We recommend that you stop smoking   Note your daily symptoms > remember "red flags" for COPD:   >>>Increase in cough >>>increase in sputum production >>>increase in shortness of breath or activity  intolerance.   If you notice these symptoms, please call the office to be seen.    Stage 2 moderate COPD by GOLD classification ([place]) Follow-up with Dr. Chase Caller in 1 month or first available >>> Follow-up regarding Prolastin  If you have worsening shortness of breath, fatigue, cough you need to present to the emergency room or office quickly because this can be due to your vaping  Continue Anoro Ellipta  >>> Take 1 puff daily in the morning right when you wake up >>>Rinse your mouth out after use >>>This is a daily maintenance inhaler, NOT a rescue inhaler >>>Contact our office if you are having difficulties affording or obtaining this medication >>>It is important for you to be able to take this daily and not miss any doses     We recommend that you stop smoking We recommend that you stop smoking.  >>>You need to set a quit date >>>If you have friends or family who smoke, let them know you  are trying to quit and not to smoke around you or in your living environment  Smoking Cessation Resources:  1 800 QUIT NOW  >>> Patient to call this resource and utilize it to help support her quit smoking >>> Keep up your hard work with stopping smoking  You can also contact the [facility] >>>For smoking cessation classes call [phone]  We do not recommend using e-cigarettes as a form of stopping smoking  You can sign up for smoking cessation support texts and information:  >>>https://smokefree.gov/smokefreetxt  Nicotine patches: >>>Make sure you rotate sites that you do not get skin irritation, Apply 1 patch each morning to a non-hairy skin site  If you are smoking greater than 10 cigarettes/day and weigh over 45 kg start with the nicotine patch of 21 mg a day for 6 weeks, then 14 mg a day for 2 weeks, then finished with 7 mg a day for 2 weeks, then stop  If you are smoking less than 10 cigarettes a day or weight less than 45 kg start with medium dose pack of 14 mg a day for 6 weeks, followed by 7 mg a day for 2 weeks   >>>If insomnia occurs you are having trouble sleeping you can take the patch off at night, and place a new one on in the morning >>>If the patch is removed at night and you have morning cravings start short acting nicotine replacement therapy such as gum or lozenges  Nicotine Gum:  >>>Smokers who smoke 25 or more cigarettes a day should use 4 mg dose >>>Smokers who smoke fewer than  25 cigarettes a day should use 2 mg dose  Proper chewing of gum is important for optimal results.   >>>Do not chew gum to rapidly.  Once you start chewing eating tasty peppery taste and slide the gum to your cheek.  When the taste disappears to a few more times.  Repeat this for 30 minutes.  Then discard the gum.  >>>Avoid acidic beverages such as coffee, carbonated beverages before and during gum use.  A soft acidic beverages lower oral pH which cause nicotine to not  be absorbed properly >>>If you chew the gum too quickly or vigorously you could have nausea, vomiting, abdominal pain, constipation, hiccups, headache, sore jaw, mouth irritation ulcers  Nicotine lozenge: Lozenges are commonly uses short acting NRT product  >>>Smokers who smoke within 30 minutes of awakening should use 4 mg dose >>>Smokers who wait more than 30 minutes after awakening to smoke should use 2 mg dose  Can use up to 1 lozenge every 1-2 hours for 6 weeks >>>Total amount of lozenges that can be used per day as 20 >>>Gradually reduce number of lozenges used per day after 2 weeks of use  Place lozenge in mouth and allowed to dissolve for 30 minutes loss and does not need to be chewed  Lozenges have advantages to be able to be used in people with TMG, poor dentition, dentures  Note your daily symptoms > remember "red flags" for COPD:   >>>Increase in cough >>>increase in sputum production >>>increase in shortness of breath or activity  intolerance.   If you notice these symptoms, please call the office to be seen.    Schizoaffective disorder Referred to psychiatry  Tobacco use  We recommend that you stop smoking We recommend that you stop smoking.  >>>You need to set a quit date >>>If you have friends or family who smoke, let them know you are trying to quit and not to smoke around you or in your living environment  Smoking Cessation Resources:  1 800 QUIT NOW  >>> Patient to call this resource and utilize it to help support her quit smoking >>> Keep up your hard work with stopping smoking  You can also contact the [facility] >>>For smoking cessation classes call [phone]  We do not recommend using e-cigarettes as a form of stopping smoking  You can sign up for smoking cessation support texts and information:  >>>https://smokefree.gov/smokefreetxt  Nicotine patches: >>>Make sure you rotate sites that you do not get skin irritation, Apply 1  patch each morning to a non-hairy skin site  If you are smoking greater than 10 cigarettes/day and weigh over 45 kg start with the nicotine patch of 21 mg a day for 6 weeks, then 14 mg a day for 2 weeks, then finished with 7 mg a day for 2 weeks, then stop  If you are smoking less than 10 cigarettes a day or weight less than 45 kg start with medium dose pack of 14 mg a day for 6 weeks, followed by 7 mg a day for 2 weeks   >>>If insomnia occurs you are having trouble sleeping you can take the patch off at night, and place a new one on in the morning >>>If the patch is removed at night and you have morning cravings start short acting nicotine replacement therapy such as gum or lozenges  Nicotine Gum:  >>>Smokers who smoke 25 or more cigarettes a day should use 4 mg dose >>>Smokers who smoke fewer than 25  cigarettes a day should use 2 mg dose  Proper chewing of gum is important for optimal results.   >>>Do not chew gum to rapidly.  Once you start chewing eating tasty peppery taste and slide the gum to your cheek.  When the taste disappears to a few more times.  Repeat this for 30 minutes.  Then discard the gum.  >>>Avoid acidic beverages such as coffee, carbonated beverages before and during gum use.  A soft acidic beverages lower oral pH which cause nicotine to not be absorbed properly >>>If you chew the gum too quickly or vigorously you could have nausea, vomiting, abdominal pain, constipation, hiccups, headache, sore jaw, mouth irritation ulcers  Nicotine lozenge: Lozenges are commonly uses short acting NRT product  >>>Smokers who smoke within 30 minutes of awakening should use 4 mg dose >>>Smokers who wait more than 30 minutes after awakening to smoke should use 2 mg dose  Can use up to 1 lozenge every 1-2 hours for 6 weeks >>>Total amount of lozenges that can be used per day as 20 >>>Gradually reduce number of lozenges used per day after 2 weeks of use  Place lozenge in mouth and  allowed to dissolve for 30 minutes loss and does not need to be chewed  Lozenges have advantages to be able to be used in people with TMG, poor dentition, dentures   This appointment was 42 minutes along with over 50% of the time in direct face-to-face patient care, assessment, plan of care follow-up in smoking cessation and discussing vape use.   Lauraine Rinne, NP 01/22/2018

## 2018-01-22 ENCOUNTER — Encounter: Payer: Self-pay | Admitting: Pulmonary Disease

## 2018-01-22 ENCOUNTER — Telehealth: Payer: Self-pay | Admitting: Pulmonary Disease

## 2018-01-22 ENCOUNTER — Ambulatory Visit (INDEPENDENT_AMBULATORY_CARE_PROVIDER_SITE_OTHER): Payer: Medicare Other | Admitting: Pulmonary Disease

## 2018-01-22 VITALS — BP 122/98 | HR 60 | Ht 72.0 in | Wt 227.0 lb

## 2018-01-22 DIAGNOSIS — E8801 Alpha-1-antitrypsin deficiency: Secondary | ICD-10-CM

## 2018-01-22 DIAGNOSIS — Z720 Tobacco use: Secondary | ICD-10-CM

## 2018-01-22 DIAGNOSIS — K219 Gastro-esophageal reflux disease without esophagitis: Secondary | ICD-10-CM | POA: Insufficient documentation

## 2018-01-22 DIAGNOSIS — J449 Chronic obstructive pulmonary disease, unspecified: Secondary | ICD-10-CM

## 2018-01-22 DIAGNOSIS — F258 Other schizoaffective disorders: Secondary | ICD-10-CM | POA: Diagnosis not present

## 2018-01-22 DIAGNOSIS — F17210 Nicotine dependence, cigarettes, uncomplicated: Secondary | ICD-10-CM | POA: Diagnosis not present

## 2018-01-22 MED ORDER — OMEPRAZOLE 20 MG PO CPDR: 20.0000 mg | DELAYED_RELEASE_CAPSULE | Freq: Every day | ORAL | 4 refills | Status: DC

## 2018-01-22 NOTE — Assessment & Plan Note (Signed)
Referred to psychiatry.  

## 2018-01-22 NOTE — Telephone Encounter (Signed)
PLAN from pt's last OV with MR 11/21/17:  -Smoking: My CMA will call your psychiatrist and see if she can prescribe your Chantix or to help you quit but we understand that in the presence of schizoaffective disorder that it will be extremely difficult for you to quit smoking -Alpha-1 deficiency: My CMA will initiate paperwork to start you on Prolastin given the progressive loss of lung function in the last 4 years steadily -COPD: Even though your symptom score is low given loss of lung function change Incruse to [employer] daily - fLu shot: please have flu shot 11/21/2017   I checked through all of MR's paperwork and also my specialty meds folder that I keep applications in for pts while we are awaiting approval of meds but I have nothing in there nor do I have any papers in regards to Prolastin start for pt.  MR, please advise if you want me to go ahead and initiate paperwork now for the prolastin start or do you want to wait until pt's next OV with you to get this initiated? Thanks!

## 2018-01-22 NOTE — Assessment & Plan Note (Signed)
  We recommend that you stop smoking We recommend that you stop smoking.  >>>You need to set a quit date >>>If you have friends or family who smoke, let them know you are trying to quit and not to smoke around you or in your living environment  Smoking Cessation Resources:  1 800 QUIT NOW  >>> Patient to call this resource and utilize it to help support her quit smoking >>> Keep up your hard work with stopping smoking  You can also contact the [facility] >>>For smoking cessation classes call [phone]  We do not recommend using e-cigarettes as a form of stopping smoking  You can sign up for smoking cessation support texts and information:  >>>https://smokefree.gov/smokefreetxt  Nicotine patches: >>>Make sure you rotate sites that you do not get skin irritation, Apply 1 patch each morning to a non-hairy skin site  If you are smoking greater than 10 cigarettes/day and weigh over 45 kg start with the nicotine patch of 21 mg a day for 6 weeks, then 14 mg a day for 2 weeks, then finished with 7 mg a day for 2 weeks, then stop  If you are smoking less than 10 cigarettes a day or weight less than 45 kg start with medium dose pack of 14 mg a day for 6 weeks, followed by 7 mg a day for 2 weeks   >>>If insomnia occurs you are having trouble sleeping you can take the patch off at night, and place a new one on in the morning >>>If the patch is removed at night and you have morning cravings start short acting nicotine replacement therapy such as gum or lozenges  Nicotine Gum:  >>>Smokers who smoke 25 or more cigarettes a day should use 4 mg dose >>>Smokers who smoke fewer than 25 cigarettes a day should use 2 mg dose  Proper chewing of gum is important for optimal results.   >>>Do not chew gum to rapidly.  Once you start chewing eating tasty peppery taste and slide the gum to your cheek.  When the taste disappears to a few more times.  Repeat this for 30 minutes.  Then discard  the gum.  >>>Avoid acidic beverages such as coffee, carbonated beverages before and during gum use.  A soft acidic beverages lower oral pH which cause nicotine to not be absorbed properly >>>If you chew the gum too quickly or vigorously you could have nausea, vomiting, abdominal pain, constipation, hiccups, headache, sore jaw, mouth irritation ulcers  Nicotine lozenge: Lozenges are commonly uses short acting NRT product  >>>Smokers who smoke within 30 minutes of awakening should use 4 mg dose >>>Smokers who wait more than 30 minutes after awakening to smoke should use 2 mg dose  Can use up to 1 lozenge every 1-2 hours for 6 weeks >>>Total amount of lozenges that can be used per day as 20 >>>Gradually reduce number of lozenges used per day after 2 weeks of use  Place lozenge in mouth and allowed to dissolve for 30 minutes loss and does not need to be chewed  Lozenges have advantages to be able to be used in people with TMG, poor dentition, dentures

## 2018-01-22 NOTE — Assessment & Plan Note (Signed)
Follow-up with Dr. Chase Caller in 1 month or first available >>> Follow-up regarding Prolastin  If you have worsening shortness of breath, fatigue, cough you need to present to the emergency room or office quickly because this can be due to your vaping  Continue Anoro Ellipta  >>> Take 1 puff daily in the morning right when you wake up >>>Rinse your mouth out after use >>>This is a daily maintenance inhaler, NOT a rescue inhaler >>>Contact our office if you are having difficulties affording or obtaining this medication >>>It is important for you to be able to take this daily and not miss any doses     We recommend that you stop smoking We recommend that you stop smoking.  >>>You need to set a quit date >>>If you have friends or family who smoke, let them know you are trying to quit and not to smoke around you or in your living environment  Smoking Cessation Resources:  1 800 QUIT NOW  >>> Patient to call this resource and utilize it to help support her quit smoking >>> Keep up your hard work with stopping smoking  You can also contact the [facility] >>>For smoking cessation classes call 5313239918  We do not recommend using e-cigarettes as a form of stopping smoking  You can sign up for smoking cessation support texts and information:  >>>https://smokefree.gov/smokefreetxt  Nicotine patches: >>>Make sure you rotate sites that you do not get skin irritation, Apply 1 patch each morning to a non-hairy skin site  If you are smoking greater than 10 cigarettes/day and weigh over 45 kg start with the nicotine patch of 21 mg a day for 6 weeks, then 14 mg a day for 2 weeks, then finished with 7 mg a day for 2 weeks, then stop  If you are smoking less than 10 cigarettes a day or weight less than 45 kg start with medium dose pack of 14 mg a day for 6 weeks, followed by 7 mg a day for 2 weeks   >>>If insomnia occurs you are having trouble sleeping you can take the patch off  at night, and place a new one on in the morning >>>If the patch is removed at night and you have morning cravings start short acting nicotine replacement therapy such as gum or lozenges  Nicotine Gum:  >>>Smokers who smoke 25 or more cigarettes a day should use 4 mg dose >>>Smokers who smoke fewer than 25 cigarettes a day should use 2 mg dose  Proper chewing of gum is important for optimal results.   >>>Do not chew gum to rapidly.  Once you start chewing eating tasty peppery taste and slide the gum to your cheek.  When the taste disappears to a few more times.  Repeat this for 30 minutes.  Then discard the gum.  >>>Avoid acidic beverages such as coffee, carbonated beverages before and during gum use.  A soft acidic beverages lower oral pH which cause nicotine to not be absorbed properly >>>If you chew the gum too quickly or vigorously you could have nausea, vomiting, abdominal pain, constipation, hiccups, headache, sore jaw, mouth irritation ulcers  Nicotine lozenge: Lozenges are commonly uses short acting NRT product  >>>Smokers who smoke within 30 minutes of awakening should use 4 mg dose >>>Smokers who wait more than 30 minutes after awakening to smoke should use 2 mg dose  Can use up to 1 lozenge every 1-2 hours for 6 weeks >>>Total amount of lozenges that can be used  per day as 20 >>>Gradually reduce number of lozenges used per day after 2 weeks of use  Place lozenge in mouth and allowed to dissolve for 30 minutes loss and does not need to be chewed  Lozenges have advantages to be able to be used in people with TMG, poor dentition, dentures  Note your daily symptoms > remember "red flags" for COPD:   >>>Increase in cough >>>increase in sputum production >>>increase in shortness of breath or activity  intolerance.   If you notice these symptoms, please call the office to be seen.

## 2018-01-22 NOTE — Patient Instructions (Addendum)
Follow-up with Dr. Chase Caller in 1 month or first available >>> Follow-up regarding Prolastin  If you have worsening shortness of breath, fatigue, cough you need to present to the emergency room or office quickly because this can be due to your vaping  Continue Anoro Ellipta  >>> Take 1 puff daily in the morning right when you wake up >>>Rinse your mouth out after use >>>This is a daily maintenance inhaler, NOT a rescue inhaler >>>Contact our office if you are having difficulties affording or obtaining this medication >>>It is important for you to be able to take this daily and not miss any doses     We recommend that you stop smoking We recommend that you stop smoking.  >>>You need to set a quit date >>>If you have friends or family who smoke, let them know you are trying to quit and not to smoke around you or in your living environment  Smoking Cessation Resources:  1 800 QUIT NOW  >>> Patient to call this resource and utilize it to help support her quit smoking >>> Keep up your hard work with stopping smoking  You can also contact the [facility] >>>For smoking cessation classes call [phone]  We do not recommend using e-cigarettes as a form of stopping smoking  You can sign up for smoking cessation support texts and information:  >>>https://smokefree.gov/smokefreetxt  Nicotine patches: >>>Make sure you rotate sites that you do not get skin irritation, Apply 1 patch each morning to a non-hairy skin site  If you are smoking greater than 10 cigarettes/day and weigh over 45 kg start with the nicotine patch of 21 mg a day for 6 weeks, then 14 mg a day for 2 weeks, then finished with 7 mg a day for 2 weeks, then stop  If you are smoking less than 10 cigarettes a day or weight less than 45 kg start with medium dose pack of 14 mg a day for 6 weeks, followed by 7 mg a day for 2 weeks   >>>If insomnia occurs you are having trouble sleeping you can take the patch off  at night, and place a new one on in the morning >>>If the patch is removed at night and you have morning cravings start short acting nicotine replacement therapy such as gum or lozenges  Nicotine Gum:  >>>Smokers who smoke 25 or more cigarettes a day should use 4 mg dose >>>Smokers who smoke fewer than 25 cigarettes a day should use 2 mg dose  Proper chewing of gum is important for optimal results.   >>>Do not chew gum to rapidly.  Once you start chewing eating tasty peppery taste and slide the gum to your cheek.  When the taste disappears to a few more times.  Repeat this for 30 minutes.  Then discard the gum.  >>>Avoid acidic beverages such as coffee, carbonated beverages before and during gum use.  A soft acidic beverages lower oral pH which cause nicotine to not be absorbed properly >>>If you chew the gum too quickly or vigorously you could have nausea, vomiting, abdominal pain, constipation, hiccups, headache, sore jaw, mouth irritation ulcers  Nicotine lozenge: Lozenges are commonly uses short acting NRT product  >>>Smokers who smoke within 30 minutes of awakening should use 4 mg dose >>>Smokers who wait more than 30 minutes after awakening to smoke should use 2 mg dose  Can use up to 1 lozenge every 1-2 hours for 6 weeks >>>Total amount of lozenges that can be used  per day as 20 >>>Gradually reduce number of lozenges used per day after 2 weeks of use  Place lozenge in mouth and allowed to dissolve for 30 minutes loss and does not need to be chewed  Lozenges have advantages to be able to be used in people with TMG, poor dentition, dentures   Acid reflux GERD management: >>>Avoid laying flat until 2 hours after meals >>>Elevate head of the bed including entire chest >>>Reduce size of meals and amount of fat, acid, spices, caffeine and sweets >>>If you are smoking, Please stop! >>>Decrease alcohol consumption >>>Work on maintaining a healthy weight with normal BMI    Omeprazole 20 mg tablet  >>>Please take 1 tablet daily 15 minutes to 30 minutes before your first meal of the day as well as before your other medications >>>Try to take at the same time each day >>>take this medication daily  Note your daily symptoms > remember "red flags" for COPD:   >>>Increase in cough >>>increase in sputum production >>>increase in shortness of breath or activity  intolerance.   If you notice these symptoms, please call the office to be seen.    It is flu season:   >>>Remember to be washing your hands regularly, using hand sanitizer, be careful to use around herself with has contact with people who are sick will increase her chances of getting sick yourself. >>> Best ways to protect herself from the flu: Receive the yearly flu vaccine, practice good hand hygiene washing with soap and also using hand sanitizer when available, eat a nutritious meals, get adequate rest, hydrate appropriately   Please contact the office if your symptoms worsen or you have concerns that you are not improving.   Thank you for choosing [facility] for your healthcare, and for allowing [place] to partner with you on your healthcare journey. I am thankful to be able to provide care to you today.   [name] FNP-C    Coping with Quitting Smoking Quitting smoking is a physical and mental challenge. You will face cravings, withdrawal symptoms, and temptation. Before quitting, work with your health care provider to make a plan that can help you cope. Preparation can help you quit and keep you from giving in. How can I cope with cravings? Cravings usually last for 5-10 minutes. If you get through it, the craving will pass. Consider taking the following actions to help you cope with cravings:  Keep your mouth busy: ? Chew sugar-free gum. ? Suck on hard candies or a straw. ? Brush your teeth.  Keep your hands and body busy: ? Immediately change to a different activity when you  feel a craving. ? Squeeze or play with a ball. ? Do an activity or a hobby, like making bead jewelry, practicing needlepoint, or working with wood. ? Mix up your normal routine. ? Take a short exercise break. Go for a quick walk or run up and down stairs. ? Spend time in public places where smoking is not allowed.  Focus on doing something kind or helpful for someone else.  Call a friend or family member to talk during a craving.  Join a support group.  Call a quit line, such as 1-800-QUIT-NOW.  Talk with your health care provider about medicines that might help you cope with cravings and make quitting easier for you.  How can I deal with withdrawal symptoms? Your body may experience negative effects as it tries to get used to not having nicotine in the system.  These effects are called withdrawal symptoms. They may include:  Feeling hungrier than normal.  Trouble concentrating.  Irritability.  Trouble sleeping.  Feeling depressed.  Restlessness and agitation.  Craving a cigarette.  To manage withdrawal symptoms:  Avoid places, people, and activities that trigger your cravings.  Remember why you want to quit.  Get plenty of sleep.  Avoid coffee and other caffeinated drinks. These may worsen some of your symptoms.  How can I handle social situations? Social situations can be difficult when you are quitting smoking, especially in the first few weeks. To manage this, you can:  Avoid parties, bars, and other social situations where people might be smoking.  Avoid alcohol.  Leave right away if you have the urge to smoke.  Explain to your family and friends that you are quitting smoking. Ask for understanding and support.  Plan activities with friends or family where smoking is not an option.  What are some ways I can cope with stress? Wanting to smoke may cause stress, and stress can make you want to smoke. Find ways to manage your stress. Relaxation techniques can  help. For example:  Breathe slowly and deeply, in through your nose and out through your mouth.  Listen to soothing, relaxing music.  Talk with a family member or friend about your stress.  Light a candle.  Soak in a bath or take a shower.  Think about a peaceful place.  What are some ways I can prevent weight gain? Be aware that many people gain weight after they quit smoking. However, not everyone does. To keep from gaining weight, have a plan in place before you quit and stick to the plan after you quit. Your plan should include:  Having healthy snacks. When you have a craving, it may help to: ? Eat plain popcorn, crunchy carrots, celery, or other cut vegetables. ? Chew sugar-free gum.  Changing how you eat: ? Eat small portion sizes at meals. ? Eat 4-6 small meals throughout the day instead of 1-2 large meals a day. ? Be mindful when you eat. Do not watch television or do other things that might distract you as you eat.  Exercising regularly: ? Make time to exercise each day. If you do not have time for a long workout, do short bouts of exercise for 5-10 minutes several times a day. ? Do some form of strengthening exercise, like weight lifting, and some form of aerobic exercise, like running or swimming.  Drinking plenty of water or other low-calorie or no-calorie drinks. Drink 6-8 glasses of water daily, or as much as instructed by your health care provider.  Summary  Quitting smoking is a physical and mental challenge. You will face cravings, withdrawal symptoms, and temptation to smoke again. Preparation can help you as you go through these challenges.  You can cope with cravings by keeping your mouth busy (such as by chewing gum), keeping your body and hands busy, and making calls to family, friends, or a helpline for people who want to quit smoking.  You can cope with withdrawal symptoms by avoiding places where people smoke, avoiding drinks with caffeine, and getting  plenty of rest.  Ask your health care provider about the different ways to prevent weight gain, avoid stress, and handle social situations. This information is not intended to replace advice given to you by your health care provider. Make sure you discuss any questions you have with your health care provider. Document Released: 02/12/2016 Document Revised: 02/12/2016 Document  Reviewed: 02/12/2016 Elsevier Interactive Patient Education  2018 [place].  Gastroesophageal Reflux Disease, Adult Normally, food travels down the esophagus and stays in the stomach to be digested. If a person has gastroesophageal reflux disease (GERD), food and stomach acid move back up into the esophagus. When this happens, the esophagus becomes sore and swollen (inflamed). Over time, GERD can make small holes (ulcers) in the lining of the esophagus. Follow these instructions at home: Diet  Follow a diet as told by your doctor. You may need to avoid foods and drinks such as: ? Coffee and tea (with or without caffeine). ? Drinks that contain alcohol. ? Energy drinks and sports drinks. ? Carbonated drinks or sodas. ? Chocolate and cocoa. ? Peppermint and mint flavorings. ? Garlic and onions. ? Horseradish. ? Spicy and acidic foods, such as peppers, chili powder, curry powder, vinegar, hot sauces, and BBQ sauce. ? Citrus fruit juices and citrus fruits, such as oranges, lemons, and limes. ? Tomato-based foods, such as red sauce, chili, salsa, and pizza with red sauce. ? Fried and fatty foods, such as donuts, french fries, potato chips, and high-fat dressings. ? High-fat meats, such as hot dogs, rib eye steak, sausage, ham, and bacon. ? High-fat dairy items, such as whole milk, butter, and cream cheese.  Eat small meals often. Avoid eating large meals.  Avoid drinking large amounts of liquid with your meals.  Avoid eating meals during the 2-3 hours before bedtime.  Avoid lying down right after you eat.  Do  not exercise right after you eat. General instructions  Pay attention to any changes in your symptoms.  Take over-the-counter and prescription medicines only as told by your doctor. Do not take aspirin, ibuprofen, or other NSAIDs unless your doctor says it is okay.  Do not use any tobacco products, including cigarettes, chewing tobacco, and e-cigarettes. If you need help quitting, ask your doctor.  Wear loose clothes. Do not wear anything tight around your waist.  Raise (elevate) the head of your bed about 6 inches (15 cm).  Try to lower your stress. If you need help doing this, ask your doctor.  If you are overweight, lose an amount of weight that is healthy for you. Ask your doctor about a safe weight loss goal.  Keep all follow-up visits as told by your doctor. This is important. Contact a doctor if:  You have new symptoms.  You lose weight and you do not know why it is happening.  You have trouble swallowing, or it hurts to swallow.  You have wheezing or a cough that keeps happening.  Your symptoms do not get better with treatment.  You have a hoarse voice. Get help right away if:  You have pain in your arms, neck, jaw, teeth, or back.  You feel sweaty, dizzy, or light-headed.  You have chest pain or shortness of breath.  You throw up (vomit) and your throw up looks like blood or coffee grounds.  You pass out (faint).  Your poop (stool) is bloody or black.  You cannot swallow, drink, or eat. This information is not intended to replace advice given to you by your health care provider. Make sure you discuss any questions you have with your health care provider. Document Released: 08/03/2007 Document Revised: 07/23/2015 Document Reviewed: 06/11/2014 Elsevier Interactive Patient Education  2018 [place] for Gastroesophageal Reflux Disease, Adult When you have gastroesophageal reflux disease (GERD), the foods you eat and your eating habits are  very important. Choosing the right foods can help ease your discomfort. What guidelines do I need to follow? Choose fruits, vegetables, whole grains, and low-fat dairy products. Choose low-fat meat, fish, and poultry. Limit fats such as oils, salad dressings, butter, nuts, and avocado. Keep a food diary. This helps you identify foods that cause symptoms. Avoid foods that cause symptoms. These may be different for everyone. Eat small meals often instead of 3 large meals a day. Eat your meals slowly, in a place where you are relaxed. Limit fried foods. Cook foods using methods other than frying. Avoid drinking alcohol. Avoid drinking large amounts of liquids with your meals. Avoid bending over or lying down until 2-3 hours after eating. What foods are not recommended? These are some foods and drinks that may make your symptoms worse: Vegetables Tomatoes. Tomato juice. Tomato and spaghetti sauce. Chili peppers. Onion and garlic. Horseradish. Fruits Oranges, grapefruit, and lemon (fruit and juice). Meats High-fat meats, fish, and poultry. This includes hot dogs, ribs, ham, sausage, salami, and bacon. Dairy Whole milk and chocolate milk. Sour cream. Cream. Butter. Ice cream. Cream cheese. Drinks Coffee and tea. Bubbly (carbonated) drinks or energy drinks. Condiments Hot sauce. Barbecue sauce. Sweets/Desserts Chocolate and cocoa. Donuts. Peppermint and spearmint. Fats and Oils High-fat foods. This includes [place] fries and potato chips. Other Vinegar. Strong spices. This includes black pepper, white pepper, red pepper, cayenne, curry powder, cloves, ginger, and chili powder. The items listed above may not be a complete list of foods and drinks to avoid. Contact your dietitian for more information. This information is not intended to replace advice given to you by your health care provider. Make sure you discuss any questions you have with your health care provider. Document Released:  08/16/2011 Document Revised: 07/23/2015 Document Reviewed: 12/19/2012 Elsevier Interactive Patient Education  2017 [employer].

## 2018-01-22 NOTE — Assessment & Plan Note (Signed)
Follow-up with Dr. Chase Caller in 1 month or first available >>> Follow-up regarding Prolastin  If you have worsening shortness of breath, fatigue, cough you need to present to the emergency room or office quickly because this can be due to your vaping  We recommend that you stop smoking   Note your daily symptoms > remember "red flags" for COPD:   >>>Increase in cough >>>increase in sputum production >>>increase in shortness of breath or activity  intolerance.   If you notice these symptoms, please call the office to be seen.

## 2018-01-22 NOTE — Assessment & Plan Note (Signed)
  Acid reflux GERD management: >>>Avoid laying flat until 2 hours after meals >>>Elevate head of the bed including entire chest >>>Reduce size of meals and amount of fat, acid, spices, caffeine and sweets >>>If you are smoking, Please stop! >>>Decrease alcohol consumption >>>Work on maintaining a healthy weight with normal BMI   Omeprazole 20 mg tablet  >>>Please take 1 tablet daily 15 minutes to 30 minutes before your first meal of the day as well as before your other medications >>>Try to take at the same time each day >>>take this medication daily

## 2018-01-22 NOTE — Telephone Encounter (Signed)
Will route to Raquel Sarna to take care of this.

## 2018-01-22 NOTE — Telephone Encounter (Signed)
Yes go ahead and start prolastin paper work

## 2018-01-22 NOTE — Telephone Encounter (Signed)
01/22/18 1203  Patient seen in office today.  Patient has now started vaping over 50 times a day.  Patient is smoking 3 cigarettes a day.  Patient still has not started Prolastin reports he has not heard anything from our office.  Sending a note to start process for Raquel Sarna, Dr. Golden Pop nurse to follow-up with patient regarding status of Prolastin.  Patient to follow-up in 1 month to see Dr. Chase Caller or next available.   [name] FNP

## 2018-01-24 NOTE — Telephone Encounter (Signed)
I have faxed the paperwork for the prolastin start. Will leave encounter open until all has been finalized.

## 2018-01-31 NOTE — Telephone Encounter (Signed)
Ian Grimes came by office today and spoke with me in regards to the recent referral that was placed for pt to begin prolastin. Per [name], the application was received.  Pt is wanting to do home infusion for the prolastin. Per [name] and [name], pt should have 1st dose assessment for the home infusion within the next 5 days. Due to pt having medicaid, there is no PA that is needed to be done.

## 2018-02-01 NOTE — Telephone Encounter (Signed)
Leslie Dales calling from Prolastin about infusion and needs to speak to nurse, CB is [phone] ext [phone].

## 2018-02-01 NOTE — Telephone Encounter (Signed)
Attempted to call Nicole Kindred but unable to reach him. Left a message for Nicole Kindred to return call.  In the message that I left, I stated for Nicole Kindred to ask to speak with me directly as I am the one who is handling this [place] as it is a MR pt.

## 2018-02-01 NOTE — Telephone Encounter (Signed)
Nicole Kindred returned call. Per Nicole Kindred, everything is working well for pt to be able to begin prolastin. [facility] and then after that he should be able to get the home infusions.  Nicole Kindred is going to call [facility] to give them all the instructions and then if they need [place] to call, he would call our office to let us know what we needed to do so pt can be placed on the schedule to have this done.  Will keep encounter open until I see that pt has been able to fully start prolastin.

## 2018-02-06 DIAGNOSIS — F411 Generalized anxiety disorder: Secondary | ICD-10-CM | POA: Diagnosis not present

## 2018-02-06 DIAGNOSIS — E782 Mixed hyperlipidemia: Secondary | ICD-10-CM | POA: Diagnosis not present

## 2018-02-06 DIAGNOSIS — I10 Essential (primary) hypertension: Secondary | ICD-10-CM | POA: Diagnosis not present

## 2018-02-06 DIAGNOSIS — Z6832 Body mass index (BMI) 32.0-32.9, adult: Secondary | ICD-10-CM | POA: Diagnosis not present

## 2018-02-06 DIAGNOSIS — J301 Allergic rhinitis due to pollen: Secondary | ICD-10-CM | POA: Diagnosis not present

## 2018-02-08 NOTE — Telephone Encounter (Signed)
Spoke with Heidi Dach Monday, 12/9. Per [name], he had spoken with [facility] [place] in regards to pt being in a short term facility needing prolastin dose.  Per [name], direct orders sent to the pharmacy in regards to what needs to be done for prolastin. [name] said that [facility] was also helping [place] with this.  [name] stated that prolastin is in stock and all they were needing the orders to be sent.  I filled out the physician's orders sheet and had it signed by MR and I faxed the physician's orders sheet as well as the prolastin info sheet with all info on it to [place] [facility].  Will leave encounter open until all has been taken care of for pt.

## 2018-02-09 NOTE — Telephone Encounter (Signed)
Attempted to call Nicole Kindred with Prolastin x3 at the number provided but each time when I called, I never had any voice response from anything on the phone line. I will try to call back later.

## 2018-02-09 NOTE — Telephone Encounter (Signed)
[facility] asking to speak to Horton Marshall is [phone] ext [phone].  Checking on status.

## 2018-02-12 ENCOUNTER — Telehealth: Payer: Self-pay | Admitting: Internal Medicine

## 2018-02-12 NOTE — Telephone Encounter (Signed)
Called and spoke with Collier Salina, advised him of all information below. Collier Salina stated that nothing further is needed.

## 2018-02-12 NOTE — Progress Notes (Signed)
Cardiology Office Note   Date:  02/14/2018   ID:  Ian Grimes, DOB 1970/11/18, MRN [id number]  PCP:  [facility]  Cardiologist:   [name], MD   No chief complaint on file.     History of Present Illness: Ian Grimes is a 47 y.o. male who presents for consultation and f/u of AV disease Referred from [place] seen in our practice July 2016.  History of bicuspid AV with aneurysm 2015 had valve sparing aortic root replacement with Dr Cyndia Bent  Normal coronary arteries on cath prior to surgery Previous history of PE now off anticoagulation. TTE post op with trivial AR and EF 60-65%  Still smoking significant COPD and Alpha 1 antitrypsin deficiency Also history of Schizophrenia/depression  Needs ENT surgery with Dr Benjamine Mola for deviated septum From cardiac perspective he is cleared Will need SBE prophylaxis     Past Medical History:  Diagnosis Date  . Anxiety   . Depression   . HA (headache)   . Hx of echocardiogram    Echo (10/15):  Mild LVH, EF 60-65%, no RWMA, trivial AI, mild LAE  . Hyperlipidemia   . Pleural effusion   . Pulmonary emboli ([place])   . Pulmonary nodule    follow up CT 6 months (due 9/15)  . Schizo-affective psychosis ([place])     Past Surgical History:  Procedure Laterality Date  . ASCENDING AORTIC ROOT REPLACEMENT N/A 11/14/2013   Procedure: Value sparing Root replacement, ASCENDING AORTIC ROOT REPLACEMENT, circ Arrest;  Surgeon: Gaye Pollack, MD;  Location: [place];  Service: Open Heart Surgery;  Laterality: N/A;  . CARDIAC CATHETERIZATION    . INTRAOPERATIVE TRANSESOPHAGEAL ECHOCARDIOGRAM N/A 11/14/2013   Procedure: INTRAOPERATIVE TRANSESOPHAGEAL ECHOCARDIOGRAM;  Surgeon: Gaye Pollack, MD;  Location: [facility] OR;  Service: Open Heart Surgery;  Laterality: N/A;  . LEFT AND RIGHT HEART CATHETERIZATION WITH CORONARY ANGIOGRAM N/A 11/01/2013   Procedure: LEFT AND RIGHT HEART CATHETERIZATION WITH CORONARY ANGIOGRAM;  Surgeon:  Josue Hector, MD;  Location: [facility] CATH LAB;  Service: Cardiovascular;  Laterality: N/A;  . rib injury       Current Outpatient Medications  Medication Sig Dispense Refill  . acetaminophen (TYLENOL) 500 MG tablet Take 500 mg every 6 (six) hours as needed by mouth (Pt takes 2-4 tabs prn.).    Marland Kitchen hydrOXYzine (ATARAX/VISTARIL) 25 MG tablet Take 1 tablet (25 mg total) by mouth 3 (three) times daily as needed. 90 tablet 2  . metoprolol tartrate (LOPRESSOR) 25 MG tablet Take 0.5 tablets (12.5 mg total) by mouth 2 (two) times daily. 60 tablet 4  . omeprazole (PRILOSEC) 20 MG capsule Take 1 capsule (20 mg total) by mouth daily. 30 capsule 4  . pravastatin (PRAVACHOL) 80 MG tablet TAKE 1 TABLET BY MOUTH EVERYDAY AT BEDTIME  3  . umeclidinium-vilanterol (ANORO ELLIPTA) 62.5-25 MCG/INH AEPB Inhale 1 puff into the lungs daily.    . sertraline (ZOLOFT) 100 MG tablet Take 1 tablet (100 mg total) by mouth daily. 30 tablet 2   No current facility-administered medications for this visit.     Allergies:   Bactrim    Social History:  The patient  reports that he has been smoking cigarettes. He has a 22.00 pack-year smoking history. He has never used smokeless tobacco. He reports that he does not drink alcohol or use drugs.   Family History:  The patient's family history includes Alcohol abuse in his father; Asthma in his paternal aunt; COPD  in his father and paternal aunt; Cancer in his father; Diabetes in his paternal grandfather; Heart attack in his father; Heart disease in his father; Stroke in his father and paternal uncle.    ROS:  Please see the history of present illness.   Otherwise, review of systems are positive for none.   All other systems are reviewed and negative.    PHYSICAL EXAM: VS:  BP 118/68   Pulse (!) 53   Ht 6' (1.829 m)   Wt 230 lb 6.4 oz (104.5 kg)   SpO2 97%   BMI 31.25 kg/m  , BMI Body mass index is 31.25 kg/m. Affect appropriate Overweight white male Nicotine on  breath HEENT: normal Neck supple with no adenopathy JVP normal no bruits no thyromegaly Lungs clear with no wheezing and good diaphragmatic motion Heart:  S1/S2 no murmur, no rub, gallop or click PMI normal Abdomen: benighn, BS positve, no tenderness, no AAA no bruit.  No HSM or HJR Distal pulses intact with no bruits No edema Neuro non-focal Skin warm and dry No muscular weakness    EKG:  NSR normal ECG 2015   Recent Labs: No results found for requested labs within last 8760 hours.    Lipid Panel    Component Value Date/Time   CHOL 234 (H) 12/02/2015 1341   CHOL 184 07/17/2012 1201   TRIG 333 (H) 12/02/2015 1341   TRIG 387 (H) 01/18/2013 1208   TRIG 315 (H) 07/17/2012 1201   HDL 19 (L) 12/02/2015 1341   HDL 26 (L) 01/18/2013 1208   HDL 32 (L) 07/17/2012 1201   CHOLHDL 12.3 (H) 12/02/2015 1341   CHOLHDL 5.6 05/06/2013 1223   VLDL 33 05/06/2013 1223   LDLCALC 148 (H) 12/02/2015 1341   LDLCALC 58 01/18/2013 1208   LDLCALC 89 07/17/2012 1201      Wt Readings from Last 3 Encounters:  02/14/18 230 lb 6.4 oz (104.5 kg)  01/22/18 227 lb (103 kg)  11/21/17 225 lb (102.1 kg)      Other studies Reviewed: Additional studies/ records that were reviewed today include: Notes from 2015 cath OR report [place] office notes recent notes pulmonary regarding COPD.    ASSESSMENT AND PLAN:  1.  Bicuspid AV:  Valve sparing operation 2015 needs updated TTE post aneurysm resection with hemi shield graft. SBE prophylaxis 2. COPD:  F/u Dr Chase Caller also with history of alpha 1 antitrypsin and previous PE no longer on anticoagulation. Discussed smoking cessation needs more current CXR ordered too young for lung cancer screening CT 3. HLD:  Continue statin labs with primary  4. Schizophrenia:  On Zoloft f/u with psychiatry  5. ENT:  Clear to have surgery for deviated septum   Current medicines are reviewed at length with the patient today.  The patient does not have concerns  regarding medicines.  The following changes have been made:  no change  Labs/ tests ordered today include: TTE and CXR No orders of the defined types were placed in this encounter.    Disposition:   FU with cardiology in a year      Signed, [name], MD  02/14/2018 10:22 AM    [facility] [address], [place], [place]  [address] Phone: [phone]; Fax: [phone]

## 2018-02-12 NOTE — Telephone Encounter (Signed)
See other phone note. All of the information was given. Nothing further needed.

## 2018-02-14 ENCOUNTER — Encounter: Payer: Self-pay | Admitting: Cardiovascular Disease

## 2018-02-14 ENCOUNTER — Ambulatory Visit (INDEPENDENT_AMBULATORY_CARE_PROVIDER_SITE_OTHER): Payer: Medicare Other | Admitting: Cardiovascular Disease

## 2018-02-14 VITALS — BP 118/68 | HR 53 | Ht 72.0 in | Wt 230.4 lb

## 2018-02-14 DIAGNOSIS — Q231 Congenital insufficiency of aortic valve: Secondary | ICD-10-CM

## 2018-02-14 DIAGNOSIS — J439 Emphysema, unspecified: Secondary | ICD-10-CM | POA: Diagnosis not present

## 2018-02-14 NOTE — Patient Instructions (Addendum)
Medication Instructions:   If you need a refill on your cardiac medications before your next appointment, please call your pharmacy.   Lab work:  If you have labs (blood work) drawn today and your tests are completely normal, you will receive your results only by: Marland Kitchen MyChart Message (if you have MyChart) OR . A paper copy in the mail If you have any lab test that is abnormal or we need to change your treatment, we will call you to review the results.  Testing/Procedures: Your physician has requested that you have an echocardiogram. Echocardiography is a painless test that uses sound waves to create images of your heart. It provides your doctor with information about the size and shape of your heart and how well your heart's chambers and valves are working. This procedure takes approximately one hour. There are no restrictions for this procedure.  A chest x-ray takes a picture of the organs and structures inside the chest, including the heart, lungs, and blood vessels. This test can show several things, including, whether the heart is enlarges; whether fluid is building up in the lungs; and whether pacemaker / defibrillator leads are still in place. Please go to [facility] at [place] [address]   Follow-Up: At [facility], you and your health needs are our priority.  As part of our continuing mission to provide you with exceptional heart care, we have created designated Provider Care Teams.  These Care Teams include your primary Cardiologist (physician) and Advanced Practice Providers (APPs -  Physician Assistants and Nurse Practitioners) who all work together to provide you with the care you need, when you need it. You will need a follow up appointment in 12 months.  Please call our office 2 months in advance to schedule this appointment.  You may see [name], MD or one of the following Advanced Practice Providers on your designated Care Team:   Truitt Merle, NP Cecilie Kicks, NP . Kathyrn Drown, NP

## 2018-02-16 NOTE — Addendum Note (Signed)
Addended by: Carylon Perches on: 02/16/2018 09:12 AM   Modules accepted: Orders

## 2018-02-26 ENCOUNTER — Telehealth: Payer: Self-pay | Admitting: Cardiovascular Disease

## 2018-02-26 NOTE — Telephone Encounter (Signed)
Called patient back. Informed patient that he can get the chest xray at any time, but it might be better to get it before he sees the pulmonologist. Patient verbalized understanding and will try to get chest xray done sooner than later.

## 2018-02-26 NOTE — Telephone Encounter (Signed)
New Message   Pt is calling to find out if he was suppose to have a chest xray prior to his echo

## 2018-03-06 ENCOUNTER — Ambulatory Visit (INDEPENDENT_AMBULATORY_CARE_PROVIDER_SITE_OTHER): Payer: Medicare Other | Admitting: Internal Medicine

## 2018-03-06 ENCOUNTER — Other Ambulatory Visit (HOSPITAL_COMMUNITY): Payer: Self-pay | Admitting: *Deleted

## 2018-03-06 ENCOUNTER — Encounter: Payer: Self-pay | Admitting: Internal Medicine

## 2018-03-06 VITALS — BP 124/72 | HR 63 | Ht 72.0 in | Wt 229.2 lb

## 2018-03-06 DIAGNOSIS — Z720 Tobacco use: Secondary | ICD-10-CM

## 2018-03-06 DIAGNOSIS — Z86711 Personal history of pulmonary embolism: Secondary | ICD-10-CM | POA: Diagnosis not present

## 2018-03-06 DIAGNOSIS — J449 Chronic obstructive pulmonary disease, unspecified: Secondary | ICD-10-CM

## 2018-03-06 DIAGNOSIS — Z23 Encounter for immunization: Secondary | ICD-10-CM | POA: Diagnosis not present

## 2018-03-06 DIAGNOSIS — E8801 Alpha-1-antitrypsin deficiency: Secondary | ICD-10-CM | POA: Diagnosis not present

## 2018-03-06 NOTE — Progress Notes (Signed)
IOV 06/12/2013  Chief Complaint  Patient presents with  . Advice Only    Referred for pulmonary nodule.  Had CT chest at the end of March.  Has no breathing complaints at this time.    48 year old smoker with significant psychiatric history but a fairly good historian also with anxiety, hyperlipidemia and smoking. He was admitted 05/06/2013 with 3 week history of dyspnea. Was diagnosed with bilateral pulmonary embolism. Treated by the hospitalist service. He was on 2 L nasal cannula with a respirator 30 per minute at the time of admission. He was discharged 05/08/2013 on xarelto. Since then he has returned to baseline health. He is followed up with his primary care physician. He says that for the last 1 week he is not taking his xarelto because his primary care physician apparently told him that the refills are actually once a month. He is also confused that his primary care physician has been regarding that he is on Coumadin but he insists that he wants on xarelto up until one week ago and that there was no change in plan to take any other medication. He had tolerated xarelto well without any problems of bleeding or compliance issues.  CT scan of the chest at that time should 8 mm subpleural nodule in the lateral segment of the right middle lobe and that is the main reason for referral today  Other she smoking: He continues to smoke and is unable to quit.  reports that he has been smoking Cigarettes.  He has a 22 pack-year smoking history. He has never used smokeless tobacco.  IMPRESSION:   CT chest 05/06/13 Segmental pulmonary emboli within all lobes. Overall clot burden is  moderate.  8 x 6 mm triangular subpleural nodule in the lateral right middle  lobe, corresponding to the radiographic abnormality, favored to  reflect a benign subpleural lymph node but technically  indeterminate. Given patient's high risk for primary bronchogenic  neoplasm, initial follow-up CT chest is suggested  in 6 months.  Faint ground-glass nodular opacities in the left upper lobe/lingula,  possibly infectious/inflammatory.  Trace right pleural effusion.  Nodule recommendation follows the consensus statement: Guidelines  for Management of Small Pulmonary Nodules Detected on CT Scans: A  Statement from the [place] as published in Radiology  2005; 237:395-400.  Critical value/emergent results were called by telephone at the time  of interpretation on 05/06/2013 at 9:12 AM to [facility], who  verbally acknowledged these results.  Electronically Signed  By: Julian Hy M.D.  On: 05/06/2013 09:35         OV 08/19/2013  Chief Complaint  Patient presents with  . Follow-up    Pt states breathing is unchanged. Pt states he only coughs to clear his throat. Pt states he constant sinus headaches. Pt denies SOB and CP.     Folllowup   Smoking: still smokes. Struggling to q uit. Depression makes it harder to quit  PE: back on xaretlo 59m per day after med review. He has no problems. MOwed yard 4h and no dyspnea   Lung nodule: has resolved June 2015 CT chest but new finding: Ascending Aorta Aneurumsm 4.5cm on CT chest   IMPRESSION:  1. No suspicious pulmonary nodules. Subpleural density previously  noted at the right lung base has resolved, likely atelectasis or  sequela of pulmonary embolism demonstrated on prior examination.  2. Mild centrilobular and paraseptal emphysema.  3. Ascending aortic aneurysm appears mildly enlarged compared with  the prior  study. Of note, previously demonstrated pulmonary embolism  is not addressed on this noncontrast study.  Electronically Signed  By: Camie Patience M.D.  On: 08/07/2013 12:30     OV 11/19/2014  Chief Complaint  Patient presents with  . Follow-up    Pt c/o mild DOE, prod cough with clear mucus. Pt deneis CP/tightness.     SMoking:  reports that he has been smoking Cigarettes.  He has a 22 pack-year smoking history.  He has never used smokeless tobacco. CT scan of the chest in 2015 did show some subclinical emphysema  Pulmonary embolism: This was diagnosed in the spring of 2015. He completed one year Xarelto therapy earlier this year and I believe either under the instructions of primary care physician or cardiologist he came off therapy. He has since been referred here to follow-up. He feels fine without any chest pain hemoptysis syncope cough or dyspnea. The only amount of dyspnea he has this when he initially starts walking but as he warms up he does not have any dyspnea. His effort tolerance is good. Cardiologist wondered about hypercoagulable workup.     OV 06/01/2015  Chief Complaint  Patient presents with  . Follow-up    Pt here for 6 months f/u. Pt states his breathing is unchanged since last OV. Pt c/o prod cough with clear thin mucus and chest tightness at random times.     48 year old male  0- has subclinical emphysema: CT chest June 2015 without any nodules. He reports ongoing shortness of breath with exertion for walking over a block on level ground. This is stable. Insidious onset for the last 3-4 years. Associated with being overweight. No  Pulmonary function tests this visit  - Alpha 1 genetic deficiency: Last visit I checked his alpha-1. His level was 76. Phenotype was  SZ and abnormal. Shed results with him again  - Pulmonary embolism: Completed one year of therapy summer 2016 since then no clinical evidence of pulmonary embolism or DVT. He continues on aspirin therapy. D-dimer at last visit was normal suggesting recurrence risk is low  - Smoking: He continues to smoke. He knows he needs to quit but is finding it difficult  Past medical history: He has chronic headaches. He canceled an MRI due to concern of prosthesis and acyanotic. He'll be with his neurologist again    has a past medical history of Schizo-affective psychosis ([place]); Anxiety; Depression; HA (headache); Hyperlipidemia;  Pulmonary emboli ([place]); Pulmonary nodule; Pleural effusion; and echocardiogram.   reports that he has been smoking Cigarettes.  He has a 22 pack-year smoking history. He has never used smokeless tobacco.  OV 01/05/2016  Chief Complaint  Patient presents with  . Follow-up    Pt here after PFT. Pt denies changes in SOB since last OV. Pt c/o prod cough with clear mucus. Pt states he is getting over a cold. Pt denies CP/tightness and f/c/s.     47 year old male with smoking history, emphysema and history of pulmonary embolism status post completion of anticoagulation. This in the background setting of schizoaffective disorder and taking multiple psychiatric drugs  Emphysema associated with alpha-1 SZ: He says his breathing is stable. He has class II dyspnea and exertion and he notices this when walking up a steep incline. It is relieved by rest. This no associated chest pain. The cough is associated as mild and dry. No diaphoresis or palpitations. Overall symptoms are stable compared to 2 years ago. However he did have pulmonary function test that is documented  below and shows decline both in the prebronchodilator elements and the post bronchodilator elements and diffusion capacity. His alpha-1 deficiency. He is aware of this. I'm not so sure that he has to talk to his family members about it.  Smoking history: He continues to smoke. He says it helps on his notes. He says it'll be very difficult to quit. His psychiatrist is Dr. Sima Matas in [place]. He says he willing to take his help in trying to quit smoking.  Pulmonary Embolism: Last taken anticoagulation agent months ago. D-dimer was normal. He is on daily aspirin therapy. He says he is compliant with aspirin. No symptoms of recurrence.   OV 04/11/2016  Chief Complaint  Patient presents with  . Follow-up    Pt. states he feels like his breathing has remained unchanged,He is still coughing but it has been better  But he still has sinus  headaches, Denies chest tightness,fever   Follow-up  History of pulmonary embolism: He is on baby aspirin prophylaxis. He says he is compliant with it. No clinical evidence of recurrence based on history this visit  Alpha-1 antitrypsin deficiency SZ with low levels with emphysema: He is taking Spiriva . He says he spoke to his aunt about his alpha-1 and he says the whole family knows about the alpha-1 deficiency but they failed to inform him. He is unsure whether he should take replacement and he wants to read some information about it. However he has not quit smoking. Overall COPD stable with a cat score of 17   smoking history: He continues to smoke. He is unable to quit. Now he tells me that his psychiatrist is actually Dr. Harrington Challenger and when he met with her in [place] she said that she would not be the right MD  to help him with quitting smoking.  He understands the need to quit     OV 07/11/2016  Chief Complaint  Patient presents with  . Follow-up    Pt here after PFT. Pt denies change in breathing since last OV, denies cough and f/c/s. Pt states he had an episode of right sided chest tightness that lasted 20 min, pt states he was resting at the time of presentation.     Follow-up stage II COPD alpha-1 SZ the with low levels : Overall he's stable. He is currently not on any scheduled inhaler therapy due to insurance reasons and confusion as to which inhaler would be covered.. Nevertheless he feels stable and well. He had animals spirometry because of his alpha-1 and it is actually improved compared to last year.  In terms of smoking: He continues to smoke and difficult to quit  Pulmonary embolism history: He continues on baby aspirin and has not had any problems   OV 01/17/2017  Chief Complaint  Patient presents with  . Follow-up    Pt states that he is doing better. Denies any real complaints of cough, SOB, or CP.    Follow-up multiple issues  Gold stage II COPD alpha-1 SZ  with low levels: There is a 62-monthfollow-up.  Symptom wise he is stable.  His COPD CAD score below has improved and this is without any inhaler therapy.  I gave him sample inhalers he said it did not make any difference.  He did not price these inhalers.  He is not even using albuterol as needed.  I reeducated him about the need for maintenance.  We decided to keep it simple and just use Spiriva  Smoking: He has associated  depression and he finds it hard to quit.  According to the primary care notes the depression itself is stable  Pulmonary embolism history: He continues on baby aspirin has not had any problems   OV 07/19/2017  Chief Complaint  Patient presents with  . Follow-up    PFT done today. Pt states he has been doing good since last visit. Denies any real complaints.   Gold stage II COPD Alpha-1 deficiency SZ\smoking  Presents for routine follow-up.  Overall feels stable.  Pulmonary function test shows a decline in lung function although diffusion capacity is the same.  He still continues to smoke.  In talking to him it appears as anticholinergics was sent to wrong pharmacy and therefore is not on any anticholinergic.  He knows he is to quit smoking.  There are no other new issues.   OV 11/21/2017  Subjective:  Patient ID: Ian Grimes, male , DOB: December 05, 1970 , age 36 y.o. , MRN: [id number] , ADDRESS: [address] [place] [id number]   11/21/2017 -   Chief Complaint  Patient presents with  . Follow-up    PFT performed today. Pt states he has been doing well since last visit and denies any complaints.   Follow-up Gold stage II COPD, smoking, alpha-1 antitrypsin SZ deficiency  HPI Ian Grimes 48 y.o. -presents for follow-up of the above 3 problems.  In the interim he has restarted his anticholinergic inhalers Incruse.  His symptom scores are slightly better COPD CAT score 10.  He is not on any other inhaler.  His lung function shows stability compared to  May 2019 in terms of FEV1 but his DLCO shows a decline as shown below.  Review of his pulmonary function test in the last 4 years shows continued and progressive decline.  This is on account of his alpha-1 and ongoing smoking.  He tells me that in an ideal world he would like to quit but given his schizoaffective disorder he has been unable to quit.  Without cigarettes he gets very nervous.  He did not tolerate Zyban many years ago.  He sees a psychiatrist once a month because his schizoaffective disorder requires intense monitoring.  He does not have any firearms.  At this point in time he is not suicidal or homicidal.  He will have a flu shot today.   OV 03/06/2018  Subjective:  Patient ID: Ian Grimes, male , DOB: 13-Mar-1970 , age 74 y.o. , MRN: [id number] , ADDRESS: [address] [place] [id number]   03/06/2018 -   Chief Complaint  Patient presents with  . Follow-up    Pt states he has been the same since last visit. States he will be beginning Prolastin tomorrow, 03/07/2018. Denies any current complaints of cough, SOB, or CP.     HPI Ian Grimes 48 y.o. -  Follow-up alpha-1 antitrypsin deficiency: He is finally going to start Prolastin tomorrow.  [facility] dose will be at [facility] because of a bee sting allergy.  If all goes well then he will get his infusions at home  Moderate COPD progressively worse: Currently stable without any exacerbation.  COPD CAT score is listed below.  He is compliant with his Anoro.  He is up-to-date with his flu shot.  He is never had pneumonia vaccine in the past he is willing to have it today.  Reports no allergy  History of pulmonary embolism in 2015 with treatment of 2016  and normal d-dimer in 2016: He was supposed to be on baby aspirin but I do not see this on the med list.  He is denying any PE symptoms  Smoking history: He did not start himself on Chantix.  Using e-cigarettes brought in like open market he was able to reduce to 1  cigarette/day.  However given the vaping epidemic of acute lung injury he was advised to stop this and I was conventional cigarette smoking is relapsed to 1 pack a day.  He wants to restart electronic cigarettes.    CAT COPD Symptom & Quality of Life Score (GSK trademark) 0 is no burden. 5 is highest burden 04/11/2016  01/17/2017  11/21/2017   03/06/2018   Never Cough -> Cough all the time  _0 No phlegm in chest -> Chest is full of phlegm _1 No chest tightness -> Chest feels very tight 1 0 1 2  No dyspnea for 1 flight stairs/hill -> Very dyspneic for 1 flight of stairs _2 No limitations for ADL at home -> Very limited with ADL at home 0 0 0 1  Confident leaving home -> Not at all confident leaving home 5 0 0 2  Sleep soundly -> Do not sleep soundly because of lung condition _3 Lots of Energy -> No energy at all _4 TOTAL Score (max 40)  _5 Results for Ian Grimes, Ian Grimes (MRN [id number]) as of 11/21/2017 10:08  Ref. Range 11/12/2013 14:38 01/05/2016 10:28 07/11/2016 11:22 07/19/2017 10:35 11/21/2017 08:57  FEV1-Pre Latest Units: L 2.68 2.47 2.61 2.32 2.31  FEV1-%Pred-Pre Latest Units: % 66 62 66 59 59   Results for Ian Grimes, Ian Grimes (MRN [id number]) as of 11/21/2017 10:08  Ref. Range 11/12/2013 14:38 01/05/2016 10:28 07/11/2016 11:22 07/19/2017 10:35 11/21/2017 08:57  DLCO unc Latest Units: ml/min/mmHg 26.56 22.42 24.74 21.99 18.96  DLCO unc % pred Latest Units: % 85 72 79 70 61       ROS - per HPI     has a past medical history of Anxiety, Depression, HA (headache), echocardiogram, Hyperlipidemia, Pleural effusion, Pulmonary emboli ([place]), Pulmonary nodule, and Schizo-affective psychosis ([place]).   reports that he has been smoking cigarettes. He has a 22.00 pack-year smoking history. He has never used smokeless tobacco.  Past Surgical History:  Procedure Laterality Date  . ASCENDING AORTIC ROOT REPLACEMENT N/A 11/14/2013   Procedure: Value sparing Root  replacement, ASCENDING AORTIC ROOT REPLACEMENT, circ Arrest;  Surgeon: Gaye Pollack, MD;  Location: [place];  Service: Open Heart Surgery;  Laterality: N/A;  . CARDIAC CATHETERIZATION    . INTRAOPERATIVE TRANSESOPHAGEAL ECHOCARDIOGRAM N/A 11/14/2013   Procedure: INTRAOPERATIVE TRANSESOPHAGEAL ECHOCARDIOGRAM;  Surgeon: Gaye Pollack, MD;  Location: [facility] OR;  Service: Open Heart Surgery;  Laterality: N/A;  . LEFT AND RIGHT HEART CATHETERIZATION WITH CORONARY ANGIOGRAM N/A 11/01/2013   Procedure: LEFT AND RIGHT HEART CATHETERIZATION WITH CORONARY ANGIOGRAM;  Surgeon: Josue Hector, MD;  Location[facility] C[facility] LAB;  Service: Cardiovascular;  Laterality: N/A;  . rib injury      Allergies  Allergen Reactions  . Bactrim Rash    Immunization History  Administered Date(s) Administered  . Influenza,inj,Quad PF,6+ Mos 01/17/2017, 11/21/2017  . Pneumococcal Polysaccharide-23 03/06/2018    Family History  Problem Relation Age of Onset  . Heart disease Father   . Stroke Father   . Cancer  Father        lung  . Heart attack Father   . COPD Father   . Alcohol abuse Father   . Diabetes Paternal Grandfather   . COPD Paternal 59   . Asthma Paternal Aunt   . Stroke Paternal Uncle      Current Outpatient Medications:  .  acetaminophen (TYLENOL) 500 MG tablet, Take 500 mg every 6 (six) hours as needed by mouth (Pt takes 2-4 tabs prn.)., Disp: , Rfl:  .  hydrOXYzine (ATARAX/VISTARIL) 25 MG tablet, Take 1 tablet (25 mg total) by mouth 3 (three) times daily as needed., Disp: 90 tablet, Rfl: 2 .  metoprolol tartrate (LOPRESSOR) 25 MG tablet, Take 0.5 tablets (12.5 mg total) by mouth 2 (two) times daily., Disp: 60 tablet, Rfl: 4 .  omeprazole (PRILOSEC) 20 MG capsule, Take 1 capsule (20 mg total) by mouth daily., Disp: 30 capsule, Rfl: 4 .  pravastatin (PRAVACHOL) 80 MG tablet, TAKE 1 TABLET BY MOUTH EVERYDAY AT BEDTIME, Disp: , Rfl: 3 .  sertraline (ZOLOFT) 100 MG tablet, Take 100 mg by mouth daily., Disp:  , Rfl:  .  umeclidinium-vilanterol (ANORO ELLIPTA) 62.5-25 MCG/INH AEPB, Inhale 1 puff into the lungs daily., Disp: , Rfl:  .  sertraline (ZOLOFT) 100 MG tablet, Take 1 tablet (100 mg total) by mouth daily., Disp: 30 tablet, Rfl: 2      Objective:   Vitals:   03/06/18 0933  BP: 124/72  Pulse: 63  SpO2: 97%  Weight: 229 lb 3.2 oz (104 kg)  Height: 6' (1.829 m)    Estimated body mass index is 31.09 kg/m as calculated from the following:   Height as of this encounter: 6' (1.829 m).   Weight as of this encounter: 229 lb 3.2 oz (104 kg).  _0 @  [employer]   03/06/18 0933  Weight: 229 lb 3.2 oz (104 kg)     Physical Exam  General Appearance:    Alert, cooperative, no distress, appears stated age - no, looks older , Deconditioned looking - yes , OBESE  - no, Sitting on Wheelchair -  no  Head:    Normocephalic, without obvious abnormality, atraumatic  Eyes:    PERRL, conjunctiva/corneas clear,  Ears:    Normal TM's and external ear canals, both ears  Nose:   Nares normal, septum midline, mucosa normal, no drainage    or sinus tenderness. OXYGEN ON  - no . Patient is @ ra   Throat:   Lips, mucosa, and tongue normal; teeth and gums normal. Cyanosis on lips - no  Neck:   Supple, symmetrical, trachea midline, no adenopathy;    thyroid:  no enlargement/tenderness/nodules; no carotid   bruit or JVD  Back:     Symmetric, no curvature, ROM normal, no CVA tenderness  Lungs:     Distress - no , Wheeze no, Barrell Chest - no, Purse lip breathing - no, Crackles - no   Chest Wall:    No tenderness or deformity.    Heart:    Regular rate and rhythm, S1 and S2 normal, no rub   or gallop, Murmur - no  Breast Exam:    NOT DONE  Abdomen:     Soft, non-tender, bowel sounds active all four quadrants,    no masses, no organomegaly. Visceral obesity - no  Genitalia:   NOT DONE  Rectal:   NOT DONE  Extremities:   Extremities - normal, Has Cane - no, Clubbing - no, Edema - no  Pulses:   2+ and symmetric all extremities  Skin:   Stigmata of Connective Tissue Disease - no  Lymph nodes:   Cervical, supraclavicular, and axillary nodes normal  Psychiatric:  Neurologic:   Pleasant - yes, Anxious - no, Flat affect - yes  CAm-ICU - neg, Alert and Oriented x 3 - yes, Moves all 4s - yes, Speech - normal, Cognition - intact           Assessment:       ICD-10-CM   1. Alpha-1-antitrypsin deficiency ([place]) E88.01   2. Stage 2 moderate COPD by GOLD classification ([place]) J44.9   3. Tobacco use Z72.0   4. History of pulmonary embolism Z86.711        Plan:     Patient Instructions  Alpha-1-antitrypsin deficiency [facility]) -Glad you are starting Prolastin tomorrow January 2020  Stage 2 moderate COPD by GOLD classification ([place]) -Clinically stable disease -Continue Anoro daily and albuterol as needed -Glad you are up-to-date with flu shot -Get Pneumovax vaccine today  Tobacco use -Too bad smoking dosage is gone up -It appears e-cigarette had helped you come down on conventional cigarettes -We discussed and I will support cautious use of e-cigarette [purchased at regular store, good brand name, non-black market] -with specific intent of quitting smoking and with the ultimate goal of even quitting e-cigarettes over the next 3-6 months  History of pulmonary embolism -2015 with treatment through 2016 and normal d-dimer in 2016 -Notice you are not taking baby aspirin anymore -Please restart this aspirin 81 mg once daily  Follow-up -6 months or sooner if needed; CAT score at follow-up       SIGNATURE    Dr. Brand Males, M.D., F.C.C.P,  Pulmonary and Critical Care Medicine Staff Physician, [place] Director - Interstitial Lung Disease  Program  Pulmonary [place] [facility] [place], [place], [address]  Pager: [phone], If no answer or between  15:00h - 7:00h: call [phone] Telephone: [phone]  10:34 AM 03/06/2018

## 2018-03-06 NOTE — Patient Instructions (Signed)
Alpha-1-antitrypsin deficiency ([place]) -Glad you are starting Prolastin tomorrow January 2020  Stage 2 moderate COPD by GOLD classification ([place]) -Clinically stable disease -Continue Anoro daily and albuterol as needed -Glad you are up-to-date with flu shot -Get Pneumovax vaccine today  Tobacco use -Too bad smoking dosage is gone up -It appears e-cigarette had helped you come down on conventional cigarettes -We discussed and I will support cautious use of e-cigarette [purchased at regular store, good brand name, non-black market] -with specific intent of quitting smoking and with the ultimate goal of even quitting e-cigarettes over the next 3-6 months  History of pulmonary embolism -2015 with treatment through 2016 and normal d-dimer in 2016 -Notice you are not taking baby aspirin anymore -Please restart this aspirin 81 mg once daily  Follow-up -6 months or sooner if needed; CAT score at follow-up

## 2018-03-07 ENCOUNTER — Ambulatory Visit (HOSPITAL_COMMUNITY)
Admission: RE | Admit: 2018-03-07 | Discharge: 2018-03-07 | Disposition: A | Discharge status: Home / Self Care | Payer: Medicare Other | Source: Ambulatory Visit | Attending: Internal Medicine | Admitting: Internal Medicine

## 2018-03-07 DIAGNOSIS — E8801 Alpha-1-antitrypsin deficiency: Secondary | ICD-10-CM | POA: Diagnosis not present

## 2018-03-07 MED ORDER — ALPHA1-PROTEINASE INHIBITOR 1000 MG/20ML IV SOLN: 60.0000 mg/kg | INTRAVENOUS | Status: DC

## 2018-03-07 MED ORDER — EMPTY CONTAINERS FLEXIBLE MISC
6348.0000 mg | Status: DC
  Filled 2018-03-07: qty 6348

## 2018-03-07 MED ORDER — EMPTY CONTAINERS FLEXIBLE MISC
6348.0000 mg | Status: DC
  Administered 2018-03-07: 6348 mg via INTRAVENOUS
  Filled 2018-03-07: qty 6348

## 2018-03-07 NOTE — Discharge Instructions (Signed)
Alpha-1-proteinase Inhibitor Injection What is this medicine? ALPHA-1-PROTEINASE INHIBITOR (AL fa - 1 PRO tee nase in HIB i tor) is a drug that is used to replace an enzyme in patients with lung problems caused by low levels of alpha-1 antitrypsin (ATA). It is not a cure. This medicine may be used for other purposes; ask your health care provider or pharmacist if you have questions. COMMON BRAND NAME(S): Aralast, Aralast NP, Merita Norton, Prolastin, Prolastin C, Zemaira What should I tell my health care provider before I take this medicine? They need to know if you have any of these conditions: -IgA (immunoglobulin A) deficiency -an unusual or allergic reaction to alpha-1-proteinase inhibitor, other medicines, foods, dyes, or preservatives -pregnant or trying to get pregnant -breast-feeding How should I use this medicine? This medicine is for infusion into a vein. Your healthcare professional will decide if infusion in your home is right for you. You should be trained on how to do infusions by your healthcare professional. Follow the directions on the prescription label. Do not use your medicine more often than directed. It is important that you put your used needles and supplies in a special sharps container. Do not put them in a trash can. If you do not have a sharps container, call your pharmacist or healthcare provider to get one. Talk to your pediatrician regarding the use of this medicine in children. Special care may be needed. Overdosage: If you think you have taken too much of this medicine contact a poison control center or emergency room at once. NOTE: This medicine is only for you. Do not share this medicine with others. What if I miss a dose? This medicine is used once a week. It is important not to miss your dose. Call your health care professional if you are unable to keep an appointment. If you use this medicine at home and you miss a dose, use it as soon as you can and get back to a  weekly schedule. Call your healthcare professional if you are not sure what to do about a missed dose. What may interact with this medicine? Interactions are not expected. This list may not describe all possible interactions. Give your health care provider a list of all the medicines, herbs, non-prescription drugs, or dietary supplements you use. Also tell them if you smoke, drink alcohol, or use illegal drugs. Some items may interact with your medicine. What should I watch for while using this medicine? Tell your doctor or healthcare professional if your symptoms do not start to get better or if they get worse. Your condition will be monitored carefully while you are receiving this medicine. This medicine can cause serious allergic reactions. Ask your doctor or health care professional about an epinephrine pen and/or other supportive care for certain severe allergic reactions. This medicine is made from human plasma, and there is a small risk that it may contain certain types of viruses or bacteria. All products are processed to kill most viruses and bacteria. If you have questions concerning the risk of infections, discuss them with your doctor or health care professional. What side effects may I notice from receiving this medicine? Side effects that you should report to your doctor or health care professional as soon as possible: -allergic reactions like skin rash, itching or hives, swelling of the face, lips, or tongue -breathing problems -chest pain, tightness -feeling faint or lightheaded, falls -signs and symptoms of infection like fever or chills; cough; sore throat; pain or trouble passing urine -swelling  in your legs or feet -unusually weak or tired Side effects that usually do not require medical attention (report to your doctor or health care professional if they continue or are bothersome): -diarrhea -dizziness -headache -hot flashes or flushing -joint or muscle  pain -nausea -pain, redness, or irritation at site where injected -runny or stuffy nose -tiredness This list may not describe all possible side effects. Call your doctor for medical advice about side effects. You may report side effects to FDA at 1-800-FDA-1088. Where should I keep my medicine? Keep out of the reach of children. Unopened vials may be stored in a refrigerator between 2 and 8 degrees C (36 and 46 degrees F). Do not freeze. The vials may also be kept at room temperature, below 25 degrees C (77 degrees F) for up to 1 month. Do not shake. Do not re-refrigerate once the product has been stored at room temperature. Keep in the original carton until needed for use. Throw away any unused medicine after the expiration date on the label. NOTE: This sheet is a summary. It may not cover all possible information. If you have questions about this medicine, talk to your doctor, pharmacist, or health care provider.  2019 Elsevier/Gold Standard (2016-03-07 19:23:01)

## 2018-03-08 ENCOUNTER — Other Ambulatory Visit (HOSPITAL_COMMUNITY): Payer: Self-pay

## 2018-03-14 ENCOUNTER — Ambulatory Visit (HOSPITAL_COMMUNITY)
Admission: RE | Admit: 2018-03-14 | Discharge: 2018-03-14 | Disposition: A | Discharge status: Home / Self Care | Payer: Medicare Other | Source: Ambulatory Visit | Attending: Internal Medicine | Admitting: Internal Medicine

## 2018-03-14 DIAGNOSIS — E8801 Alpha-1-antitrypsin deficiency: Secondary | ICD-10-CM | POA: Insufficient documentation

## 2018-03-14 MED ORDER — EMPTY CONTAINERS FLEXIBLE MISC
6306.0000 mg | Status: DC
  Administered 2018-03-14: 6306 mg via INTRAVENOUS
  Filled 2018-03-14: qty 6306

## 2018-03-14 NOTE — Discharge Instructions (Signed)
Alpha-1-proteinase Inhibitor Injection What is this medicine? ALPHA-1-PROTEINASE INHIBITOR (AL fa - 1 PRO tee nase in HIB i tor) is a drug that is used to replace an enzyme in patients with lung problems caused by low levels of alpha-1 antitrypsin (ATA). It is not a cure. This medicine may be used for other purposes; ask your health care provider or pharmacist if you have questions. COMMON BRAND NAME(S): Aralast, Aralast NP, Merita Norton, Prolastin, Prolastin C, Zemaira What should I tell my health care provider before I take this medicine? They need to know if you have any of these conditions: -IgA (immunoglobulin A) deficiency -an unusual or allergic reaction to alpha-1-proteinase inhibitor, other medicines, foods, dyes, or preservatives -pregnant or trying to get pregnant -breast-feeding How should I use this medicine? This medicine is for infusion into a vein. Your healthcare professional will decide if infusion in your home is right for you. You should be trained on how to do infusions by your healthcare professional. Follow the directions on the prescription label. Do not use your medicine more often than directed. It is important that you put your used needles and supplies in a special sharps container. Do not put them in a trash can. If you do not have a sharps container, call your pharmacist or healthcare provider to get one. Talk to your pediatrician regarding the use of this medicine in children. Special care may be needed. Overdosage: If you think you have taken too much of this medicine contact a poison control center or emergency room at once. NOTE: This medicine is only for you. Do not share this medicine with others. What if I miss a dose? This medicine is used once a week. It is important not to miss your dose. Call your health care professional if you are unable to keep an appointment. If you use this medicine at home and you miss a dose, use it as soon as you can and get back to a  weekly schedule. Call your healthcare professional if you are not sure what to do about a missed dose. What may interact with this medicine? Interactions are not expected. This list may not describe all possible interactions. Give your health care provider a list of all the medicines, herbs, non-prescription drugs, or dietary supplements you use. Also tell them if you smoke, drink alcohol, or use illegal drugs. Some items may interact with your medicine. What should I watch for while using this medicine? Tell your doctor or healthcare professional if your symptoms do not start to get better or if they get worse. Your condition will be monitored carefully while you are receiving this medicine. This medicine can cause serious allergic reactions. Ask your doctor or health care professional about an epinephrine pen and/or other supportive care for certain severe allergic reactions. This medicine is made from human plasma, and there is a small risk that it may contain certain types of viruses or bacteria. All products are processed to kill most viruses and bacteria. If you have questions concerning the risk of infections, discuss them with your doctor or health care professional. What side effects may I notice from receiving this medicine? Side effects that you should report to your doctor or health care professional as soon as possible: -allergic reactions like skin rash, itching or hives, swelling of the face, lips, or tongue -breathing problems -chest pain, tightness -feeling faint or lightheaded, falls -signs and symptoms of infection like fever or chills; cough; sore throat; pain or trouble passing urine -swelling  in your legs or feet -unusually weak or tired Side effects that usually do not require medical attention (report to your doctor or health care professional if they continue or are bothersome): -diarrhea -dizziness -headache -hot flashes or flushing -joint or muscle  pain -nausea -pain, redness, or irritation at site where injected -runny or stuffy nose -tiredness This list may not describe all possible side effects. Call your doctor for medical advice about side effects. You may report side effects to FDA at 1-800-FDA-1088. Where should I keep my medicine? Keep out of the reach of children. Unopened vials may be stored in a refrigerator between 2 and 8 degrees C (36 and 46 degrees F). Do not freeze. The vials may also be kept at room temperature, below 25 degrees C (77 degrees F) for up to 1 month. Do not shake. Do not re-refrigerate once the product has been stored at room temperature. Keep in the original carton until needed for use. Throw away any unused medicine after the expiration date on the label. NOTE: This sheet is a summary. It may not cover all possible information. If you have questions about this medicine, talk to your doctor, pharmacist, or health care provider.  2019 Elsevier/Gold Standard (2016-03-07 19:23:01)

## 2018-03-15 ENCOUNTER — Other Ambulatory Visit: Payer: Self-pay

## 2018-03-15 ENCOUNTER — Telehealth: Payer: Self-pay | Admitting: Internal Medicine

## 2018-03-15 ENCOUNTER — Ambulatory Visit (HOSPITAL_COMMUNITY): Payer: Medicare Other | Attending: Cardiovascular Disease

## 2018-03-15 DIAGNOSIS — Q231 Congenital insufficiency of aortic valve: Secondary | ICD-10-CM

## 2018-03-15 MED ORDER — PERFLUTREN LIPID MICROSPHERE
1.0000 mL | INTRAVENOUS | Status: AC | PRN
  Administered 2018-03-15: 2 mL via INTRAVENOUS

## 2018-03-15 NOTE — Telephone Encounter (Signed)
I called Nicole Kindred but he didn't answer. I left a message to have him fax the PA to the triage fax. Will await his return call.     Patient Instructions  Alpha-1-antitrypsin deficiency [facility]) -Glad you are starting Prolastin tomorrow January 2020  Stage 2 moderate COPD by GOLD classification ([place]) -Clinically stable disease -Continue Anoro daily and albuterol as needed -Glad you are up-to-date with flu shot -Get Pneumovax vaccine today  Tobacco use -Too bad smoking dosage is gone up -It appears e-cigarette had helped you come down on conventional cigarettes -We discussed and I will support cautious use of e-cigarette [purchased at regular store, good brand name, non-black market] -with specific intent of quitting smoking and with the ultimate goal of even quitting e-cigarettes over the next 3-6 months  History of pulmonary embolism -2015 with treatment through 2016 and normal d-dimer in 2016 -Notice you are not taking baby aspirin anymore -Please restart this aspirin 81 mg once daily  Follow-up -6 months or sooner if needed; CAT score at follow-up

## 2018-03-19 NOTE — Telephone Encounter (Signed)
Called number provided, spoke with a lady who stated that she would transfer me over to [place]. She tried to and he then stated that he has gone to a meeting and she would need to transfer me to his VM. Left VM for Nicole Kindred.

## 2018-03-19 NOTE — Telephone Encounter (Signed)
Spoke with Nicole Kindred  He is going to refax a third time to [employer]  Will forward to her to keep an eye out for

## 2018-03-19 NOTE — Telephone Encounter (Signed)
Ian Grimes returning phone call.  Phone number is [phone] x [phone].

## 2018-03-20 NOTE — Telephone Encounter (Signed)
I have not received a fax yet from [facility]

## 2018-03-20 NOTE — Telephone Encounter (Signed)
Ian Grimes please provide an update, thank you.

## 2018-03-21 ENCOUNTER — Encounter (HOSPITAL_COMMUNITY): Payer: Self-pay

## 2018-03-21 NOTE — Telephone Encounter (Signed)
LMTCB with Nicole Kindred at [employer].

## 2018-03-22 ENCOUNTER — Other Ambulatory Visit (HOSPITAL_COMMUNITY): Payer: Self-pay | Admitting: *Deleted

## 2018-03-22 NOTE — Telephone Encounter (Signed)
Kalman Shan, [phone] x [phone], left message that fax has not been received and to refax to triage fax at [phone].

## 2018-03-22 NOTE — Telephone Encounter (Signed)
I do have the fax now. The fax was put in Dr. Juline Patch box instead of Dr. Golden Pop box. Will have MR sign the form and then fax it in for pt.

## 2018-03-23 ENCOUNTER — Ambulatory Visit (HOSPITAL_COMMUNITY)
Admission: RE | Admit: 2018-03-23 | Discharge: 2018-03-23 | Disposition: A | Discharge status: Home / Self Care | Payer: Medicare Other | Source: Ambulatory Visit | Attending: Internal Medicine | Admitting: Internal Medicine

## 2018-03-23 DIAGNOSIS — E8801 Alpha-1-antitrypsin deficiency: Secondary | ICD-10-CM | POA: Insufficient documentation

## 2018-03-23 MED ORDER — EMPTY CONTAINERS FLEXIBLE MISC
6342.0000 mg | Status: DC
  Administered 2018-03-23: 6342 mg via INTRAVENOUS
  Filled 2018-03-23: qty 6342

## 2018-03-26 NOTE — Telephone Encounter (Signed)
Paperwork was faxed Friday, 03/23/2018 and I did receive confirmation that the fax went through. Have placed paperwork to be sent to scan. Nothing further needed.

## 2018-03-26 NOTE — Telephone Encounter (Signed)
Ian Grimes, has this been signed and faxed?

## 2018-03-28 ENCOUNTER — Encounter (HOSPITAL_COMMUNITY): Payer: Self-pay | Admitting: Psychiatry

## 2018-03-28 ENCOUNTER — Ambulatory Visit (INDEPENDENT_AMBULATORY_CARE_PROVIDER_SITE_OTHER): Payer: Medicare Other | Admitting: Psychiatry

## 2018-03-28 VITALS — BP 117/75 | HR 61 | Ht 72.0 in | Wt 229.0 lb

## 2018-03-28 DIAGNOSIS — F411 Generalized anxiety disorder: Secondary | ICD-10-CM | POA: Insufficient documentation

## 2018-03-28 DIAGNOSIS — F250 Schizoaffective disorder, bipolar type: Secondary | ICD-10-CM

## 2018-03-28 MED ORDER — HYDROXYZINE HCL 50 MG PO TABS: 50.0000 mg | ORAL_TABLET | Freq: Three times a day (TID) | ORAL | 0 refills | Status: DC | PRN

## 2018-03-28 MED ORDER — ZIPRASIDONE HCL 20 MG PO CAPS: 20.0000 mg | ORAL_CAPSULE | Freq: Two times a day (BID) | ORAL | 0 refills | Status: DC

## 2018-03-28 MED ORDER — SERTRALINE HCL 100 MG PO TABS: 150.0000 mg | ORAL_TABLET | Freq: Every day | ORAL | 0 refills | Status: DC
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
## 2018-03-28 NOTE — Patient Instructions (Signed)
Plan:  1. Increase dose of sertraline (Zoloft) to 1 1/2 tablet (150 mg) daily in AM.  2. Dose of hydroxyzine (Vistaril) will be increased to 50 mg tree times daily as need for anxiety (please take two capsules of 25 mg which you have now until you run out and get an new Rx for 50 mg strength).  3. Stop trazodone as it is too sedating!  4. Start Geodon 20 mg twice daily with food. We will probably increase nighttime dose next to help with sleep.  Please see me back in one month.

## 2018-03-30 ENCOUNTER — Ambulatory Visit (HOSPITAL_COMMUNITY)
Admission: RE | Admit: 2018-03-30 | Discharge: 2018-03-30 | Disposition: A | Discharge status: Home / Self Care | Payer: Medicare Other | Source: Ambulatory Visit | Attending: Internal Medicine | Admitting: Internal Medicine

## 2018-03-30 MED ORDER — EMPTY CONTAINERS FLEXIBLE MISC: 6306.0000 mg | Status: DC

## 2018-03-30 NOTE — Progress Notes (Signed)
Ian Grimes, pharmacist called to report that Prolastin was not ordered. Pt had intended to continue infusions at home but due to insurance reasons was unable. This nurse called Dr. Lynford Citizen to inform him that pt would be missing a dose. Per him, ok to resume weekly Prolastin infusions next Friday as scheduled. Pt also informed of the above with understanding verbalized.

## 2018-04-05 ENCOUNTER — Other Ambulatory Visit (HOSPITAL_COMMUNITY): Payer: Self-pay

## 2018-04-06 ENCOUNTER — Ambulatory Visit (HOSPITAL_COMMUNITY)
Admission: RE | Admit: 2018-04-06 | Discharge: 2018-04-06 | Disposition: A | Discharge status: Home / Self Care | Payer: Medicare Other | Source: Ambulatory Visit | Attending: Internal Medicine | Admitting: Internal Medicine

## 2018-04-06 DIAGNOSIS — E8801 Alpha-1-antitrypsin deficiency: Secondary | ICD-10-CM | POA: Insufficient documentation

## 2018-04-06 MED ORDER — ALPHA1-PROTEINASE INHIBITOR 1000 MG/20ML IV SOLN: 60.0000 mg/kg | INTRAVENOUS | Status: DC

## 2018-04-06 MED ORDER — EMPTY CONTAINERS FLEXIBLE MISC
6306.0000 mg | Status: DC
  Administered 2018-04-06: 6306 mg via INTRAVENOUS
  Filled 2018-04-06: qty 6306

## 2018-04-09 NOTE — Pre-Procedure Instructions (Signed)
Francesco Provencal Dupras  04/09/2018      [id number] - [place], [place] [address] [place] [address] Phone: [phone] Fax: [phone]    Your procedure is scheduled on Wednesday, February 19th.  Report to [facility] Admitting at 6:30 A.M.  Call this number if you have problems the morning of surgery:  [phone]   Remember:  Do not eat or drink after midnight.    Take these medicines the morning of surgery with A SIP OF WATER  metoprolol tartrate (LOPRESSOR)  omeprazole (PRILOSEC)  sertraline (ZOLOFT)  Inhaler  As needed:  acetaminophen (TYLENOL)-as needed hydrOXYzine (ATARAX/VISTARIL)-as needed  7 days prior to surgery STOP taking any Aspirin (unless otherwise instructed by your surgeon), Aleve, Naproxen, Ibuprofen, Motrin, Advil, Goody's, BC's, all herbal medications, fish oil, and all vitamins.    Do not wear jewelry.  Do not wear lotions, powders, or colognes, or deodorant.  Men may shave face and neck.  Do not bring valuables to the hospital.  [facility] is not responsible for any belongings or valuables.  Contacts, dentures or bridgework may not be worn into surgery.  Leave your suitcase in the car.  After surgery it may be brought to your room.  For patients admitted to the hospital, discharge time will be determined by your treatment team.  Patients discharged the day of surgery will not be allowed to drive home.   Special instructions:   [facility]- Preparing For Surgery  Before surgery, you can play an important role. Because skin is not sterile, your skin needs to be as free of germs as possible. You can reduce the number of germs on your skin by washing with CHG (chlorahexidine gluconate) Soap before surgery.  CHG is an antiseptic cleaner which kills germs and bonds with the skin to continue killing germs even after washing.    Oral Hygiene is also important to reduce your risk of infection.  Remember  - BRUSH YOUR TEETH THE MORNING OF SURGERY WITH YOUR REGULAR TOOTHPASTE  Please do not use if you have an allergy to CHG or antibacterial soaps. If your skin becomes reddened/irritated stop using the CHG.  Do not shave (including legs and underarms) for at least 48 hours prior to first CHG shower. It is OK to shave your face.  Please follow these instructions carefully.   1. Shower the NIGHT BEFORE SURGERY and the MORNING OF SURGERY with CHG.   2. If you chose to wash your hair, wash your hair first as usual with your normal shampoo.  3. After you shampoo, rinse your hair and body thoroughly to remove the shampoo.  4. Use CHG as you would any other liquid soap. You can apply CHG directly to the skin and wash gently with a scrungie or a clean washcloth.   5. Apply the CHG Soap to your body ONLY FROM THE NECK DOWN.  Do not use on open wounds or open sores. Avoid contact with your eyes, ears, mouth and genitals (private parts). Wash Face and genitals (private parts)  with your normal soap.  6. Wash thoroughly, paying special attention to the area where your surgery will be performed.  7. Thoroughly rinse your body with warm water from the neck down.  8. DO NOT shower/wash with your normal soap after using and rinsing off the CHG Soap.  9. Pat yourself dry with a CLEAN TOWEL.  10. Wear CLEAN PAJAMAS to bed the night before  surgery, wear comfortable clothes the morning of surgery  11. Place CLEAN SHEETS on your bed the night of your first shower and DO NOT SLEEP WITH PETS.    Day of Surgery:  Do not apply any deodorants/lotions.  Please wear clean clothes to the hospital/surgery center.   Remember to brush your teeth WITH YOUR REGULAR TOOTHPASTE.   Please read over the following fact sheets that you were given.

## 2018-04-10 ENCOUNTER — Inpatient Hospital Stay (HOSPITAL_COMMUNITY)
Admission: RE | Admit: 2018-04-10 | Discharge: 2018-04-10 | Disposition: A | Discharge status: Home / Self Care | Payer: Self-pay | Source: Ambulatory Visit

## 2018-04-10 NOTE — Progress Notes (Signed)
Pt a no show for PAT appointment. Left VM on main cell number at [phone]. Chart turned into [place], PAT scheduler for possible reschedule.   Jacqlyn Larsen, RN

## 2018-04-13 ENCOUNTER — Ambulatory Visit (HOSPITAL_COMMUNITY)
Admission: RE | Admit: 2018-04-13 | Discharge: 2018-04-13 | Disposition: A | Discharge status: Home / Self Care | Payer: Medicare Other | Source: Ambulatory Visit | Attending: Internal Medicine | Admitting: Internal Medicine

## 2018-04-13 DIAGNOSIS — E8801 Alpha-1-antitrypsin deficiency: Secondary | ICD-10-CM | POA: Insufficient documentation

## 2018-04-13 MED ORDER — EMPTY CONTAINERS FLEXIBLE MISC
6306.0000 mg | Status: DC
  Administered 2018-04-13: 6306 mg via INTRAVENOUS
  Filled 2018-04-13: qty 6306

## 2018-04-15 ENCOUNTER — Other Ambulatory Visit: Payer: Self-pay | Admitting: Pulmonary Disease

## 2018-04-15 ENCOUNTER — Other Ambulatory Visit: Payer: Self-pay | Admitting: Internal Medicine

## 2018-04-15 DIAGNOSIS — K219 Gastro-esophageal reflux disease without esophagitis: Secondary | ICD-10-CM

## 2018-04-16 ENCOUNTER — Telehealth (HOSPITAL_COMMUNITY): Payer: Self-pay

## 2018-04-16 NOTE — Telephone Encounter (Signed)
Patient is calling and states that the first week on Geodon was fine, however this week he is very impatient and irritable. He believes that this is from the medication, please review and advise, thank you

## 2018-04-16 NOTE — Telephone Encounter (Signed)
I would recommend to stay on a low dose of Geodon (20 mg twice daily) for now but go back down to 100 mg of Zoloft (we increased it from 100 mg daily). If irritability does not decrease then he will need to stop Geodon altogether.

## 2018-04-17 ENCOUNTER — Encounter (HOSPITAL_COMMUNITY): Payer: Self-pay | Admitting: Anesthesiology

## 2018-04-17 NOTE — Anesthesia Preprocedure Evaluation (Deleted)
Anesthesia Evaluation  Patient identified by MRN, date of birth, ID band Patient awake    Reviewed: Allergy & Precautions, H&P , NPO status , Patient's Chart, lab work & pertinent test results, reviewed documented beta blocker date and time   Airway Mallampati: II  TM Distance: >3 FB Neck ROM: full    Dental no notable dental hx.    Pulmonary COPD, Current Smoker,    Pulmonary exam normal breath sounds clear to auscultation       Cardiovascular Exercise Tolerance: Good hypertension, Pt. on home beta blockers  Rhythm:regular Rate:Normal  Echo (10/15):  Mild LVH, EF 60-65%, no RWMA, trivial AI, mild LAE   Neuro/Psych  Headaches, PSYCHIATRIC DISORDERS Anxiety Depression Bipolar Disorder Schizophrenia    GI/Hepatic Neg liver ROS, GERD  Medicated,  Endo/Other  negative endocrine ROS  Renal/GU negative Renal ROS  negative genitourinary   Musculoskeletal   Abdominal   Peds  Hematology negative hematology ROS (+)   Anesthesia Other Findings   Reproductive/Obstetrics negative OB ROS                             Anesthesia Physical Anesthesia Plan  ASA: III  Anesthesia Plan: General   Post-op Pain Management:    Induction:   PONV Risk Score and Plan: 2 and Ondansetron and Treatment may vary due to age or medical condition  Airway Management Planned: Oral ETT  Additional Equipment:   Intra-op Plan:   Post-operative Plan:   Informed Consent: I have reviewed the patients History and Physical, chart, labs and discussed the procedure including the risks, benefits and alternatives for the proposed anesthesia with the patient or authorized representative who has indicated his/her understanding and acceptance.     Dental Advisory Given  Plan Discussed with: CRNA, Anesthesiologist and Surgeon  Anesthesia Plan Comments:         Anesthesia Quick Evaluation

## 2018-04-17 NOTE — Progress Notes (Signed)
I spoke with Ian Grimes, who said  He is not going to have the surgery. I instructed patient to notify D. Teoh's office, he said he "doesn't want anyone to try to talk him into having surgery, he has made up his mind."  I called Dr. Deeann Saint office and notifieid Anderson [place].

## 2018-04-18 ENCOUNTER — Ambulatory Visit (HOSPITAL_COMMUNITY): Admission: RE | Admit: 2018-04-18 | Payer: Medicare Other | Source: Ambulatory Visit | Admitting: Otolaryngology

## 2018-04-18 SURGERY — SEPTOPLASTY, NOSE, WITH NASAL TURBINATE REDUCTION
Anesthesia: General | Laterality: Bilateral

## 2018-04-19 NOTE — Telephone Encounter (Signed)
I called patient, but phone just rang. There was no voicemail - I will attempt to call again this afternoon

## 2018-04-20 ENCOUNTER — Encounter (HOSPITAL_COMMUNITY): Payer: Self-pay

## 2018-04-23 ENCOUNTER — Ambulatory Visit (HOSPITAL_COMMUNITY): Payer: Medicare Other | Admitting: Psychiatry

## 2018-04-24 NOTE — Telephone Encounter (Signed)
He told me in jan 2020 visit thaat first infusion was at hospital for hx of bee sting allergy. And after that will be at home. How many has he gotten at hospital? I think is fine to get at home.

## 2018-04-24 NOTE — Telephone Encounter (Signed)
Patient sent in an email stating that he would like to get started with having his Prolastin infusions at home. Explained to patient I'm not sure if this is possible but I will check with MR first.   MR, please advise if this is possible for patient. Thanks!

## 2018-04-25 ENCOUNTER — Ambulatory Visit (HOSPITAL_COMMUNITY)
Admission: RE | Admit: 2018-04-25 | Discharge: 2018-04-25 | Disposition: A | Discharge status: Home / Self Care | Payer: Medicare Other | Source: Ambulatory Visit | Attending: Internal Medicine | Admitting: Internal Medicine

## 2018-04-25 ENCOUNTER — Telehealth: Payer: Self-pay

## 2018-04-25 DIAGNOSIS — E8801 Alpha-1-antitrypsin deficiency: Secondary | ICD-10-CM | POA: Insufficient documentation

## 2018-04-25 MED ORDER — EMPTY CONTAINERS FLEXIBLE MISC: 6306.0000 mg | Status: DC

## 2018-04-25 MED ORDER — EMPTY CONTAINERS FLEXIBLE MISC
6336.0000 mg | Status: AC
  Administered 2018-04-25: 6336 mg via INTRAVENOUS
  Filled 2018-04-25: qty 6336

## 2018-04-25 NOTE — Telephone Encounter (Signed)
Triage/emily  Just got a message from patient Ian Grimes saying medicare wont pay for home infusion. HE has been gtting it at short stay. Is he willing to cntinue going there? Because yesterday there was a note from triage asking if he can have it at home. From my perspective he can have it home but if insurance wont pay for it at home not sure what I can do  Thanks  MR

## 2018-04-25 NOTE — Telephone Encounter (Signed)
LMTCB   Brand Males, MD    4:43 PM  Note    Triage/emily  Just got a message from patient Ian Grimes saying medicare wont pay for home infusion. HE has been gtting it at short stay. Is he willing to cntinue going there? Because yesterday there was a note from triage asking if he can have it at home. From my perspective he can have it home but if insurance wont pay for it at home not sure what I can do  Thanks  MR

## 2018-04-26 ENCOUNTER — Encounter (HOSPITAL_COMMUNITY): Payer: Self-pay | Admitting: Psychiatry

## 2018-04-26 ENCOUNTER — Ambulatory Visit (INDEPENDENT_AMBULATORY_CARE_PROVIDER_SITE_OTHER): Payer: Medicare Other | Admitting: Psychiatry

## 2018-04-26 VITALS — BP 118/74 | HR 61 | Ht 70.5 in | Wt 231.0 lb

## 2018-04-26 DIAGNOSIS — F250 Schizoaffective disorder, bipolar type: Secondary | ICD-10-CM | POA: Diagnosis not present

## 2018-04-26 DIAGNOSIS — F411 Generalized anxiety disorder: Secondary | ICD-10-CM | POA: Diagnosis not present

## 2018-04-26 MED ORDER — HYDROXYZINE HCL 50 MG PO TABS: 50.0000 mg | ORAL_TABLET | Freq: Three times a day (TID) | ORAL | 1 refills | Status: DC | PRN

## 2018-04-26 MED ORDER — ZIPRASIDONE HCL 20 MG PO CAPS: ORAL_CAPSULE | ORAL | 1 refills | Status: DC

## 2018-04-26 MED ORDER — SERTRALINE HCL 100 MG PO TABS: 150.0000 mg | ORAL_TABLET | Freq: Every day | ORAL | 1 refills | Status: DC

## 2018-04-26 NOTE — Telephone Encounter (Signed)
Called pt but there was no answer and VM to leave message.

## 2018-04-26 NOTE — Progress Notes (Signed)
BH MD/PA/NP OP Progress Note  04/26/2018 7:58 AM Noah Lembke Freimark  MRN:  [id number]  Chief Complaint: Anxiety on and off HPI: 48 yo male with GADS and schizoaffective disorder. He has been on sertraline for some time and we have increased dose of it a month ago to 150 mg daily. Geodon was added for mood stabilization and some nighttime paranoia. Kentley also takes hydroxyzine as needed for anxiety. He reports having "good days" when he is not anxious at all and less frequent "anxious days". Sleep is fair but he still only takes 25 mg of hydroxyzine at HS despite my advice to take double dose. Instead he takes a double dose in AM as needed for anxiety. He denies feeling depressed or suicidal. He denies having perceptual disturbances.  Visit Diagnosis:    ICD-10-CM   1. Schizoaffective disorder, bipolar type ([place]) F25.0   2. GAD (generalized anxiety disorder) F41.1     Past Psychiatric History: please refer to H&P from initial visit.  Past Medical History:  Past Medical History:  Diagnosis Date  . Anxiety   . Depression   . HA (headache)   . Hx of echocardiogram    Echo (10/15):  Mild LVH, EF 60-65%, no RWMA, trivial AI, mild LAE  . Hyperlipidemia   . Pleural effusion   . Pulmonary emboli ([place])   . Pulmonary nodule    follow up CT 6 months (due 9/15)  . Schizo-affective psychosis ([place])     Past Surgical History:  Procedure Laterality Date  . ASCENDING AORTIC ROOT REPLACEMENT N/A 11/14/2013   Procedure: Value sparing Root replacement, ASCENDING AORTIC ROOT REPLACEMENT, circ Arrest;  Surgeon: Gaye Pollack, MD;  Location: [facility];  Service: Open Heart Surgery;  Laterality: N/A;  . CARDIAC CATHETERIZATION    . INTRAOPERATIVE TRANSESOPHAGEAL ECHOCARDIOGRAM N/A 11/14/2013   Procedure: INTRAOPERATIVE TRANSESOPHAGEAL ECHOCARDIOGRAM;  Surgeon: Gaye Pollack, MD;  Location[facility];  Service: Open Heart Surgery;  Laterality: N/A;  . LEFT AND RIGHT HEART CATHETERIZATION WITH CORONARY ANGIOGRAM N/A  11/01/2013   Procedure: LEFT AND RIGHT HEART CATHETERIZATION WITH CORONARY ANGIOGRAM;  Surgeon: Josue Hector, MD;  Location: [facility] CATH LAB;  Service: Cardiovascular;  Laterality: N/A;  . rib injury      Family Psychiatric History: reviewed  Family History:  Family History  Problem Relation Age of Onset  . Heart disease Father   . Stroke Father   . Cancer Father        lung  . Heart attack Father   . COPD Father   . Alcohol abuse Father   . Diabetes Paternal Grandfather   . COPD Paternal 73   . Asthma Paternal Aunt   . Stroke Paternal Uncle     Social History:  Social History   Socioeconomic History  . Marital status: Legally Separated    Spouse name: Not on file  . Number of children: 0  . Years of education: Not on file  . Highest education level: GED or equivalent  Occupational History  . Occupation: none  Social Needs  . Financial resource strain: Not hard at all  . Food insecurity:    Worry: Sometimes true    Inability: Sometimes true  . Transportation needs:    Medical: No    Non-medical: No  Tobacco Use  . Smoking status: Current Every Day Smoker    Packs/day: 1.00    Years: 22.00    Pack years: 22.00    Types: Cigarettes  . Smokeless tobacco:  Never Used  Substance and Sexual Activity  . Alcohol use: No    Alcohol/week: 0.0 standard drinks    Comment: drinks 1 can of beer/month.  06-24-15 per pt no  . Drug use: No    Comment: 06-24-15 per pt no  . Sexual activity: Not Currently  Lifestyle  . Physical activity:    Days per week: 5 days    Minutes per session: 60 min  . Stress: Very much  Relationships  . Social connections:    Talks on phone: Never    Gets together: Never    Attends religious service: Never    Active member of club or organization: No    Attends meetings of clubs or organizations: Never    Relationship status: Separated  Other Topics Concern  . Not on file  Social History Narrative  . Not on file    Allergies:  Allergies   Allergen Reactions  . Bactrim Rash  . Bee Venom Other (See Comments)    " had a lot of bee stings at once"  Only hand swelling at site of stings    Metabolic Disorder Labs: Lab Results  Component Value Date   HGBA1C 5.6 11/12/2013   MPG 114 11/12/2013   No results found for: PROLACTIN Lab Results  Component Value Date   CHOL 234 (H) 12/02/2015   TRIG 333 (H) 12/02/2015   HDL 19 (L) 12/02/2015   CHOLHDL 12.3 (H) 12/02/2015   VLDL 33 05/06/2013   LDLCALC 148 (H) 12/02/2015   LDLCALC 102 (H) 08/25/2015   Lab Results  Component Value Date   TSH 1.302 05/07/2013    Therapeutic Level Labs: Lab Results  Component Value Date   LITHIUM <0.25 (L) 08/14/2008   LITHIUM <0.25 (L) 08/11/2008   Lab Results  Component Value Date   VALPROATE 14.5 (L) 06/26/2011   No components found for:  CBMZ  Current Medications: Current Outpatient Medications  Medication Sig Dispense Refill  . acetaminophen (TYLENOL) 500 MG tablet Take 500 mg every 6 (six) hours as needed by mouth (Pt takes 2-4 tabs prn.).    Marland Kitchen hydrOXYzine (ATARAX/VISTARIL) 50 MG tablet Take 1 tablet (50 mg total) by mouth 3 (three) times daily as needed for up to 30 days for anxiety. 90 tablet 0  . metoprolol tartrate (LOPRESSOR) 25 MG tablet Take 0.5 tablets (12.5 mg total) by mouth 2 (two) times daily. (Patient taking differently: Take 25 mg by mouth 2 (two) times daily. ) 60 tablet 4  . omeprazole (PRILOSEC) 20 MG capsule TAKE 1 CAPSULE BY MOUTH EVERY DAY 90 capsule 1  . pravastatin (PRAVACHOL) 80 MG tablet Take 80 mg by mouth at bedtime.   3  . sertraline (ZOLOFT) 100 MG tablet Take 1.5 tablets (150 mg total) by mouth daily for 30 days. 45 tablet 0  . umeclidinium-vilanterol (ANORO ELLIPTA) 62.5-25 MCG/INH AEPB Inhale 1 puff into the lungs daily.    . ziprasidone (GEODON) 20 MG capsule Take 1 capsule (20 mg total) by mouth 2 (two) times daily with a meal for 30 days. 60 capsule 0   No current facility-administered  medications for this visit.      Musculoskeletal: Strength & Muscle Tone: within normal limits Gait & Station: normal Patient leans: N/A  Psychiatric Specialty Exam: Review of Systems  Constitutional: Negative.   HENT: Negative.   Eyes: Negative.   Respiratory: Negative.   Cardiovascular: Negative.   Gastrointestinal: Negative.   Genitourinary: Negative.   Musculoskeletal: Negative.   Skin:  Negative.   Neurological: Negative.   Endo/Heme/Allergies: Negative.   Psychiatric/Behavioral: The patient is nervous/anxious and has insomnia.     Blood pressure 118/74, pulse 61, height 5' 10.5" (1.791 m), weight 231 lb (104.8 kg), SpO2 96 %.Body mass index is 32.68 kg/m.  General Appearance: Casual and Well Groomed  Eye Contact:  Good  Speech:  Clear and Coherent  Volume:  Normal  Mood:  Anxious  Affect:  Full Range  Thought Process:  Goal Directed  Orientation:  Full (Time, Place, and Person)  Thought Content: Logical   Suicidal Thoughts:  No  Homicidal Thoughts:  No  Memory:  Immediate;   Good Recent;   Good Remote;   Good  Judgement:  Intact  Insight:  Fair  Psychomotor Activity:  Normal  Concentration:  Concentration: Good  Recall:  Good  Fund of Knowledge: Good  Language: Good  Akathisia:  Negative  Handed:  Right  AIMS (if indicated): not done  Assets:  Communication Skills Desire for Improvement Housing  ADL's:  Intact  Cognition: WNL  Sleep:  Fair   Screenings: GAD-7     Office Visit from 12/02/2015 in [place] Office Visit from 10/19/2015 in [place]  Total GAD-7 Score  21  21    Mini-Mental     Clinical Support from 11/20/2014 in [place]  Total Score (max 30 points )  30    PHQ2-9     Office Visit from 06/03/2016 in [place] Office Visit from 12/02/2015 in [place] Office Visit from 10/19/2015 in [place] Office Visit from 08/25/2015 in [place] from 11/20/2014 in [place]  PHQ-2 Total Score  4  6  6  6  6   PHQ-9 Total Score  16  24  23  24  20        Assessment and Plan: 48 yo male with schizoaffective disorder bipolar type and GAD. No overt psychosis, depression under good control but still some anxiety and sleep problems reported. Sertraline was increased and ziprasidone low dose added. He tolerates both well and reports that his anxiety has subsided. He takes hydroxyzine occasionally for anxiety. There is no SI, no AVH.   Plan: We will increase Geodon to 20 mg in AM and 40 mg in HS to further help with mood/slee; continue sertraline at 150  g and recommended to take double dose of hydroxyzine at HS rather than in AM. Patient will return for next follow up visit in 2 months.   Stephanie Acre, MD 04/26/2018, 7:58 AM

## 2018-04-27 ENCOUNTER — Telehealth (HOSPITAL_COMMUNITY): Payer: Self-pay

## 2018-04-27 MED ORDER — ZIPRASIDONE HCL 20 MG PO CAPS: ORAL_CAPSULE | ORAL | 0 refills | Status: DC

## 2018-04-27 MED ORDER — ZIPRASIDONE HCL 40 MG PO CAPS: ORAL_CAPSULE | ORAL | 0 refills | Status: DC

## 2018-04-27 NOTE — Telephone Encounter (Signed)
Consequence: is continued decline of lung function slowly over time - if you stop proloastin infusion. If you quit smoking, then you could help arrest decline  Also, why is your insurance company giving hassle against home infusion? Marland Kitchen Would it help if we checked if you get infusion close to your home? Say another doctor or at [facility]?   Thanks  Dr R  [address], [place] [address] [place] [address]

## 2018-04-27 NOTE — Telephone Encounter (Signed)
Spoke to patients pharmacy, we have resent the Geodon as two separate prescriptions, 20 mg q am and 40 mg qhs. There will be two copays, but insurance will cover

## 2018-04-27 NOTE — Telephone Encounter (Signed)
Insurance does not want to pay for this patient to have Geodon 3 times a day. Please review and advise, thank you

## 2018-04-27 NOTE — Telephone Encounter (Signed)
He is not on three times daily Geodon but twice daily 20 mg in AM and 40 mg at bedtime - that is how Rx was written.  We could try 40 mg bid but I am worried that he may be too sedated during the day. These are capsules so we cannot ask him to cut morning pill in half. We could also change it to 60 mg at bedtime and get rid of AM dose but he reported that it helps with anxiety during the day. Bottom line I think let us find out what he would prefer to do (given these two options 40 mg bid or 60 mg at HS only).

## 2018-04-27 NOTE — Telephone Encounter (Signed)
Pt stated that Forestine Na would work better for him.  MR please advise if you would like [place] to start this process.

## 2018-04-27 NOTE — Telephone Encounter (Signed)
MR please advise 

## 2018-04-30 NOTE — Telephone Encounter (Signed)
Ok please see if he can get it done at [facility]. Not sure they infuse there. You will need to cal their pharmacy and find out

## 2018-04-30 NOTE — Telephone Encounter (Signed)
Routing mychart encounter to myself so I can try to work on this tomorrow, 05/01/2018 for pt.

## 2018-05-01 ENCOUNTER — Other Ambulatory Visit (HOSPITAL_COMMUNITY): Payer: Self-pay | Admitting: *Deleted

## 2018-05-02 ENCOUNTER — Inpatient Hospital Stay (HOSPITAL_COMMUNITY): Admission: RE | Admit: 2018-05-02 | Payer: Self-pay | Source: Ambulatory Visit

## 2018-05-03 NOTE — Telephone Encounter (Signed)
Patient is wanting updates on [facility] infusion.    Message routed to [facility], per previous message she is working on it

## 2018-05-04 NOTE — Telephone Encounter (Signed)
[facility] at [phone] and asked to be transferred to Short Stay by operator. Line rang and rang and nobody ever picked up the phone. Will have to try back on Monday, 05/07/2018.

## 2018-05-04 NOTE — Telephone Encounter (Signed)
See previous phone encounter from 2/28 as this is a duplicate to that encounter.

## 2018-05-04 NOTE — Telephone Encounter (Signed)
This is being addressed via my chart. Nothing further is needed at this time.

## 2018-05-07 NOTE — Telephone Encounter (Signed)
Received a call back from [place] at [facility] and she stated after speaking with their pharmacist, unfortunately they do not carry prolastin so they are unable to do prolastin infusion there.

## 2018-05-07 NOTE — Telephone Encounter (Addendum)
[facility] Short Stay and spoke with [facility] to see if pt could be able to get prolastin infusion done there. Tommie was unsure so she stated to me that she will have to call me back after talking with pharmacy.  I gave her my direct phone line for her to return my call. Will await a response.

## 2018-05-08 NOTE — Telephone Encounter (Signed)
Per extensive e-mail conversation with patient, he currently takes Prolastin infusions and has missed a couple of these per transportation Patient had asked if he could receive his Prolastin in [place] as this is closer to home Per patient's chart, Raquel Sarna w/ MR has spoke with Short Stay at [facility] and their pharmacy does not carry Prolastin and this was communicated with patient already per 2.28.2020 e-mail and was sent to MR to inquire about alternatives  E-mail sent to patient with the above information

## 2018-05-08 NOTE — Telephone Encounter (Signed)
Ok we are back to square one. We have to see if Gene from [facility]in can work on [facility] infusion. Or for the moment patient has to suspend travel if inconvenient

## 2018-05-09 ENCOUNTER — Encounter (HOSPITAL_COMMUNITY): Payer: Self-pay

## 2018-05-09 NOTE — Telephone Encounter (Signed)
Called [facility] at [phone] to speak Talib to check to see if pt can be able to do at home prolastin infusions but Talib was away from his desk.  Left a detailed message for Talib to return my call.

## 2018-05-11 ENCOUNTER — Telehealth: Payer: Self-pay | Admitting: Internal Medicine

## 2018-05-11 NOTE — Telephone Encounter (Signed)
Spoke with Elmyra Ricks  She states needing to know if we have started PA for the pt to receive his prolastin infusions at home  I advised I do not see any documentation that this was started yet  She will be reaching out to the reimbursement team to get the information that we need to be faxed over  Will await fax

## 2018-05-14 NOTE — Telephone Encounter (Signed)
Ian Grimes please advise if you have received a fax on this patient as I do not see that we have. Thank you.

## 2018-05-15 NOTE — Telephone Encounter (Signed)
Message routed to [facility] for update

## 2018-05-15 NOTE — Telephone Encounter (Signed)
I have not received a PA in regards to prolastin being done at home. Has this possibly been placed in the PA folder?

## 2018-05-16 NOTE — Telephone Encounter (Signed)
Looked in [place] folder, did not see a PA for Prolastin. Per another encounter, it looks like Raquel Sarna had left a message for someone at [facility] to call us back about this medication.   Left another message for Talib at [facility].

## 2018-05-17 NOTE — Telephone Encounter (Signed)
MR please advise on patients e-mail above. Thank you.

## 2018-05-18 NOTE — Telephone Encounter (Signed)
Called [facility] to speak to [place] in regards to pt's Prolastin Rx. Spoke with Ivin Booty who stated she had been trying to get ahold with pt to review benefits but she has been unable to get ahold of pt yet. Due to Ivin Booty not able to reach pt, she has not sent anything in regards to pt's benefits, this is why she has not sent a PA yet.  Ivin Booty asked if I could fax the recent OV notes to [phone] so she has the most recent OV notes to put with pt's file. Ivin Booty stated to me once she hears from pt and pt gives her the green light to further move forward with the prolastin at home, she would then send [place] the PA info to do on pt's med. OV notes have been sent to sharon. Will await a return call from sharon once she hears from pt.

## 2018-05-19 ENCOUNTER — Other Ambulatory Visit: Payer: Self-pay | Admitting: Internal Medicine

## 2018-05-22 ENCOUNTER — Other Ambulatory Visit (HOSPITAL_COMMUNITY): Payer: Self-pay | Admitting: Psychiatry

## 2018-05-22 MED ORDER — HYDROXYZINE HCL 50 MG PO TABS: 50.0000 mg | ORAL_TABLET | Freq: Three times a day (TID) | ORAL | 1 refills | Status: DC | PRN

## 2018-05-22 MED ORDER — SERTRALINE HCL 100 MG PO TABS: 150.0000 mg | ORAL_TABLET | Freq: Every day | ORAL | 1 refills | Status: DC

## 2018-05-22 NOTE — Telephone Encounter (Signed)
All of [place] are concerned. HE should be more concerned due to copd   Plan  - hunker down - pshycial distancing  - hand washing  - do not touch face - ensue contacts are not sick x 2 weeks  -

## 2018-05-23 NOTE — Telephone Encounter (Signed)
Ivin Booty form [employer] is calling back [phone]

## 2018-05-23 NOTE — Telephone Encounter (Signed)
LVM for Ian Grimes with [employer] is calling back [phone]. [phone]

## 2018-05-25 NOTE — Telephone Encounter (Signed)
Spoke with Ivin Booty, she is needing clinical office notes in order to start the process for the pt to be able to do home infusions. OV notes have been faxed to [phone].

## 2018-05-25 NOTE — Telephone Encounter (Signed)
LMTCB x2 for Ivin Booty with [employer].

## 2018-05-25 NOTE — Telephone Encounter (Signed)
Ian Grimes returning phone call.  States has not received the faxes.  Ian Grimes phone number is [phone].

## 2018-05-28 NOTE — Telephone Encounter (Signed)
OV notes have been refaxed to [place].

## 2018-05-28 NOTE — Telephone Encounter (Signed)
Ian Grimes, is calling back. Per Ian Grimes, the first page of the fax went through however that was all. Requesting refax. Cb is [id number] [phone]

## 2018-05-29 NOTE — Telephone Encounter (Signed)
Called [facility] and spoke with Ivin Booty to check on status of paperwork and to see if anything else was needed. Ivin Booty stated to me that she did receive the fax of all of pt's OV notes so she should be able to get everything filed today to see where pt stood with everything to see if it would be approved for home infusions. Will await an update from [place].

## 2018-05-29 NOTE — Telephone Encounter (Signed)
Please advise if ok to close, thank you.

## 2018-05-29 NOTE — Telephone Encounter (Signed)
This medication is not handled by the injection lab.

## 2018-05-30 NOTE — Telephone Encounter (Signed)
Ivin Booty, [facility], is returning call. Per Ivin Booty, pt's Part D did approve Prolastin to be infused at home. Will try to get shipment this week Cb is [id number] [phone]

## 2018-05-30 NOTE — Telephone Encounter (Signed)
Called Ivin Booty unable to reach [facility] will route this over to [place] to follow up on.

## 2018-06-06 NOTE — Telephone Encounter (Signed)
Patient has sent a message for MR to send a prescription for Prolastin to [facility]. Per 3/13 telephone note, Patient will have infusion at home. 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  Message routed to [facility]

## 2018-06-07 ENCOUNTER — Telehealth: Payer: Self-pay | Admitting: Internal Medicine

## 2018-06-07 NOTE — Telephone Encounter (Signed)
Please refer to pt mychart message from 06/05/2018. Closing this encounter.

## 2018-06-07 NOTE — Telephone Encounter (Signed)
Renewal application has been received for pt's prolastin. Application does need a signature by MR but MR is currently over at the hospital due to pandemic. Attempted to call [place] to speak with Tiara but unable to reach her. Left a message for Tiara to return call.

## 2018-06-07 NOTE — Telephone Encounter (Signed)
[place]  to Courtney Heys     06/07/18 4:25 PM  Tiara returning [place] phone call. Tiara phone number is [phone].    Called [facility] to speak with Tiara but unable to reach her. Left message for Tiara to return call.

## 2018-06-12 ENCOUNTER — Telehealth: Payer: Self-pay | Admitting: Internal Medicine

## 2018-06-12 NOTE — Telephone Encounter (Signed)
Application filled out and faxed to [facility] for pt. Nothing further needed.

## 2018-06-12 NOTE — Telephone Encounter (Signed)
prolastin prescription has been refaxed with the date signed. Nothing further needed.

## 2018-06-19 DIAGNOSIS — I10 Essential (primary) hypertension: Secondary | ICD-10-CM | POA: Diagnosis not present

## 2018-06-19 DIAGNOSIS — E782 Mixed hyperlipidemia: Secondary | ICD-10-CM | POA: Diagnosis not present

## 2018-06-19 DIAGNOSIS — F411 Generalized anxiety disorder: Secondary | ICD-10-CM | POA: Diagnosis not present

## 2018-06-19 DIAGNOSIS — J301 Allergic rhinitis due to pollen: Secondary | ICD-10-CM | POA: Diagnosis not present

## 2018-06-25 ENCOUNTER — Ambulatory Visit (INDEPENDENT_AMBULATORY_CARE_PROVIDER_SITE_OTHER): Payer: Medicare Other | Admitting: Psychiatry

## 2018-06-25 ENCOUNTER — Other Ambulatory Visit: Payer: Self-pay

## 2018-06-25 DIAGNOSIS — F250 Schizoaffective disorder, bipolar type: Secondary | ICD-10-CM

## 2018-06-25 DIAGNOSIS — F411 Generalized anxiety disorder: Secondary | ICD-10-CM | POA: Diagnosis not present

## 2018-06-25 MED ORDER — ZIPRASIDONE HCL 40 MG PO CAPS: ORAL_CAPSULE | ORAL | 0 refills | Status: DC

## 2018-06-25 MED ORDER — CLONAZEPAM 0.5 MG PO TABS: 0.5000 mg | ORAL_TABLET | Freq: Two times a day (BID) | ORAL | 2 refills | Status: DC | PRN

## 2018-06-25 MED ORDER — SERTRALINE HCL 100 MG PO TABS: 150.0000 mg | ORAL_TABLET | Freq: Every day | ORAL | 0 refills | Status: DC

## 2018-06-25 MED ORDER — ZIPRASIDONE HCL 20 MG PO CAPS: ORAL_CAPSULE | ORAL | 0 refills | Status: DC

## 2018-06-25 NOTE — Progress Notes (Signed)
[place] MD/PA/NP OP Progress Note  06/25/2018 1:07 PM [name]  MRN:  [id number] Interview was conducted using phone and I verified that I was speaking with the correct person using two identifiers. I discussed the limitations of evaluation and management by telemedicine and  the availability of in person appointments. Patient expressed understanding and agreed to proceed.  Chief Complaint: Anxiety  HPI: 48 yo male with schizoaffective disorder bipolar type and GAD. No overt psychosis, depression under good control but still some anxiety (less sleep problems) reported. Sertraline and ziprasidone doses were increased: 150 mg and 20 g am/40 mg HS, respectively. He tolerates both well and reports that his anxiety has subsided. He takes hydroxyzine occasionally for anxiety but with minimal, if any benefit. There is no SI, no AVH.   Visit Diagnosis:    ICD-10-CM   1. Schizoaffective disorder, bipolar type ([place]) F25.0   2. GAD (generalized anxiety disorder) F41.1     Past Psychiatric History: Please see intake H&P.  Past Medical History:  Past Medical History:  Diagnosis Date  . Anxiety   . Depression   . HA (headache)   . Hx of echocardiogram    Echo (10/15):  Mild LVH, EF 60-65%, no RWMA, trivial AI, mild LAE  . Hyperlipidemia   . Pleural effusion   . Pulmonary emboli ([place])   . Pulmonary nodule    follow up CT 6 months (due 9/15)  . Schizo-affective psychosis ([place])     Past Surgical History:  Procedure Laterality Date  . ASCENDING AORTIC ROOT REPLACEMENT N/A 11/14/2013   Procedure: Value sparing Root replacement, ASCENDING AORTIC ROOT REPLACEMENT, circ Arrest;  Surgeon: Gaye Pollack, MD;  Location: [place];  Service: Open Heart Surgery;  Laterality: N/A;  . CARDIAC CATHETERIZATION    . INTRAOPERATIVE TRANSESOPHAGEAL ECHOCARDIOGRAM N/A 11/14/2013   Procedure: INTRAOPERATIVE TRANSESOPHAGEAL ECHOCARDIOGRAM;  Surgeon: Gaye Pollack, MD;  Location[facility];  Service: Open Heart Surgery;   Laterality: N/A;  . LEFT AND RIGHT HEART CATHETERIZATION WITH CORONARY ANGIOGRAM N/A 11/01/2013   Procedure: LEFT AND RIGHT HEART CATHETERIZATION WITH CORONARY ANGIOGRAM;  Surgeon: Josue Hector, MD;  Location: [facility] CATH LAB;  Service: Cardiovascular;  Laterality: N/A;  . rib injury      Family Psychiatric History: Reviewed2.  Family History:  Family History  Problem Relation Age of Onset  . Heart disease Father   . Stroke Father   . Cancer Father        lung  . Heart attack Father   . COPD Father   . Alcohol abuse Father   . Diabetes Paternal Grandfather   . COPD Paternal 76   . Asthma Paternal Aunt   . Stroke Paternal Uncle     Social History:  Social History   Socioeconomic History  . Marital status: Legally Separated    Spouse name: Not on file  . Number of children: 0  . Years of education: Not on file  . Highest education level: GED or equivalent  Occupational History  . Occupation: none  Social Needs  . Financial resource strain: Not hard at all  . Food insecurity:    Worry: Sometimes true    Inability: Sometimes true  . Transportation needs:    Medical: No    Non-medical: No  Tobacco Use  . Smoking status: Current Every Day Smoker    Packs/day: 1.00    Years: 22.00    Pack years: 22.00    Types: Cigarettes  . Smokeless tobacco:  Never Used  Substance and Sexual Activity  . Alcohol use: No    Alcohol/week: 0.0 standard drinks    Comment: drinks 1 can of beer/month.  06-24-15 per pt no  . Drug use: No    Comment: 06-24-15 per pt no  . Sexual activity: Not Currently  Lifestyle  . Physical activity:    Days per week: 5 days    Minutes per session: 60 min  . Stress: Very much  Relationships  . Social connections:    Talks on phone: Never    Gets together: Never    Attends religious service: Never    Active member of club or organization: No    Attends meetings of clubs or organizations: Never    Relationship status: Separated  Other Topics Concern   . Not on file  Social History Narrative  . Not on file    Allergies:  Allergies  Allergen Reactions  . Bactrim Rash  . Bee Venom Other (See Comments)    " had a lot of bee stings at once"  Only hand swelling at site of stings    Metabolic Disorder Labs: Lab Results  Component Value Date   HGBA1C 5.6 11/12/2013   MPG 114 11/12/2013   No results found for: PROLACTIN Lab Results  Component Value Date   CHOL 234 (H) 12/02/2015   TRIG 333 (H) 12/02/2015   HDL 19 (L) 12/02/2015   CHOLHDL 12.3 (H) 12/02/2015   VLDL 33 05/06/2013   LDLCALC 148 (H) 12/02/2015   LDLCALC 102 (H) 08/25/2015   Lab Results  Component Value Date   TSH 1.302 05/07/2013    Therapeutic Level Labs: Lab Results  Component Value Date   LITHIUM <0.25 (L) 08/14/2008   LITHIUM <0.25 (L) 08/11/2008   Lab Results  Component Value Date   VALPROATE 14.5 (L) 06/26/2011   No components found for:  CBMZ  Current Medications: Current Outpatient Medications  Medication Sig Dispense Refill  . acetaminophen (TYLENOL) 500 MG tablet Take 500 mg every 6 (six) hours as needed by mouth (Pt takes 2-4 tabs prn.).    Marland Kitchen hydrOXYzine (ATARAX/VISTARIL) 50 MG tablet Take 1 tablet (50 mg total) by mouth 3 (three) times daily as needed for anxiety. 90 tablet 1  . metoprolol tartrate (LOPRESSOR) 25 MG tablet Take 0.5 tablets (12.5 mg total) by mouth 2 (two) times daily. (Patient taking differently: Take 25 mg by mouth 2 (two) times daily. ) 60 tablet 4  . omeprazole (PRILOSEC) 20 MG capsule TAKE 1 CAPSULE BY MOUTH EVERY DAY 90 capsule 1  . pravastatin (PRAVACHOL) 80 MG tablet Take 80 mg by mouth at bedtime.   3  . sertraline (ZOLOFT) 100 MG tablet Take 1.5 tablets (150 mg total) by mouth daily. 45 tablet 1  . umeclidinium-vilanterol (ANORO ELLIPTA) 62.5-25 MCG/INH AEPB Inhale 1 puff into the lungs daily.    . ziprasidone (GEODON) 20 MG capsule Take 1 capsule daily in the morning 90 capsule 0  . ziprasidone (GEODON) 40 MG  capsule Take one capsule by mouth every evening 90 capsule 0   No current facility-administered medications for this visit.     Psychiatric Specialty Exam: Review of Systems  Psychiatric/Behavioral: The patient is nervous/anxious.   All other systems reviewed and are negative.   There were no vitals taken for this visit.There is no height or weight on file to calculate BMI.  General Appearance: NA  Eye Contact:  NA  Speech:  Clear and Coherent  Volume:  Normal  Mood:  Anxious  Affect:  NA  Thought Process:  Goal Directed  Orientation:  Full (Time, Place, and Person)  Thought Content: Logical   Suicidal Thoughts:  No  Homicidal Thoughts:  No  Memory:  Immediate;   Good Recent;   Good Remote;   Good  Judgement:  Good  Insight:  Fair  Psychomotor Activity:  NA  Concentration:  Concentration: Good  Recall:  Good  Fund of Knowledge: Fair  Language: Good  Akathisia:  NA  Handed:  Right  AIMS (if indicated): not done  Assets:  Communication Skills Desire for Improvement Financial Resources/Insurance Housing  ADL's:  Intact  Cognition: WNL  Sleep:  Good   Screenings: GAD-7     Office Visit from 12/02/2015 in [place] Visit from 10/19/2015 in [place]  Total GAD-7 Score  21  21    Mini-Mental     Clinical Support from 11/20/2014 in [place]  Total Score (max 30 points )  30    PHQ2-9     Office Visit from 06/03/2016 in [place] Office Visit from 12/02/2015 in [place] Office Visit from 10/19/2015 in [place] Office Visit from 08/25/2015 in [place] from 11/20/2014 in [place]  PHQ-2 Total Score  4  6  6  6  6   PHQ-9 Total Score  16  24  23  24  20        Assessment and Plan: 48 yo male with schizoaffective disorder bipolar type and GAD. No overt  psychosis, depression under good control but still some anxiety (less sleep problems) reported. Sertraline and ziprasidone doses were increased: 150 mg and 20 g am/40 mg HS, respectively. He tolerates both well and reports that his anxiety has subsided. He takes hydroxyzine occasionally for anxiety/irritability but with minimal, if any benefit. There is no SI, no AVH.   Plan: We will continue Geodon 20 mg in AM and 40 mg in HS and sertraline at 150 mg daily. We wuill discontinue hydroxyzine and add clonazepam 0.5 mg bid prn anxiety/agitation. Patient will return for next follow up visit in 3 months.   Stephanie Acre, MD 06/25/2018, 1:07 PM

## 2018-07-30 ENCOUNTER — Telehealth (HOSPITAL_COMMUNITY): Payer: Self-pay

## 2018-07-30 NOTE — Telephone Encounter (Signed)
Patient called and stated that he is discontinuing his Clonazepam 0.5mg . He stated that it makes him very unpleasant and he does not like the side affects. He has an appointment scheduled for 09/24/18. Please review and advise. Thank you.

## 2018-07-31 NOTE — Telephone Encounter (Signed)
It is fine for him to stop it - he has hydroxyzine prn anxiety (clonazepam was added because he felt more anxious then). Other meds he is on should be sufficient for mood control.  OP

## 2018-08-22 ENCOUNTER — Telehealth: Payer: Self-pay | Admitting: Internal Medicine

## 2018-08-22 NOTE — Telephone Encounter (Signed)
Please contact the patient and let him know that our recommendation is for him to continue Prolastin infusions.  This will help with controlling his alpha-1 antitrypsin deficiency.  This also help him from getting worsned emphysema.  Patient does continue to smoke which does further increases risk that his lung functioning will worsen.  The Prolastin infusions will not immediately help very preventative measure.  Ultimately this is the patient's choice.  He does need to be fully aware that it was very difficult to get him qualified for this medication as typically this is not approved for smokers.  If he does decide to restart this after stopping it may be harder for [place] to get him qualified.  If the patient has additional questions or concerns please schedule him for a office visit so we can further discuss.  Please also route the patient's decision to Dr. Chase Caller as an [place].  [name], FNP

## 2018-08-22 NOTE — Telephone Encounter (Signed)
[place] and spoke with Ian Grimes one of the pharmacists. Per Ian Grimes, pt called and spoke with one of the [facility] RNs stating that he no longer wanted to do the prolastin infusions as he did not like having to stick himself with the needles every week to do the infusions. Pt also stated since he was not feeling anything from the infusions that he did not want to stay on it. Ian Grimes stated that the RN tried to tell pt that he may not feel anything from the infusions but the infusions are to help not make things worse with the alpha-1 and even after saying that, pt still did not want to stay on the infusions.  Ian Grimes that I was going to send this over to MR and also sending to [name] as an [place]. Please advise recommendations. Thank you!

## 2018-08-23 NOTE — Telephone Encounter (Signed)
Attempted to call pt x2 but each time I tried to call, I immediately got a response stating that the call was rejected. Unable to leave a VM. Since pt has an active mychart account, I have sent pt a mychart message in regards to what we found out from [place] and also let him know the information stated by Aaron Edelman. Once pt responds back, I will post pt's response/decision.  Will await response.

## 2018-08-24 NOTE — Telephone Encounter (Signed)
There has not been a response from pt in regards to the [place] message that was sent. Tried to call pt but immediately got response stating that the call was rejected. Will try again later and also keep an eye on the mychart message that was sent to see if pt ever responds to it.

## 2018-08-24 NOTE — Telephone Encounter (Signed)
Ok noted.  Ian Grimes

## 2018-08-27 NOTE — Telephone Encounter (Signed)
Attempted to call pt but still received a response that the call was rejected. Unable to leave a VM. Checked the [employer] that had been sent to pt and pt still has not responded to the message.  Due to multiple attempts trying to reach pt and unable to do so, per triage protocol encounter will be closed.

## 2018-08-30 ENCOUNTER — Telehealth: Payer: Self-pay | Admitting: Internal Medicine

## 2018-08-30 NOTE — Telephone Encounter (Signed)
Spoke with Jackelyn Poling and advised her that we are still waiting on the patient's response. We have to tried to reach out to him several times via several methods, he has not responded.   She verbalized understanding.   After speaking with her, I called the patient. I left a message for him to call us back. Will keep this encounter open in [place] he calls back.

## 2018-09-03 NOTE — Telephone Encounter (Signed)
Attempted to call pt but unable to reach. Left message for pt to return call. 

## 2018-09-04 NOTE — Telephone Encounter (Signed)
LMTCB x2 for pt 

## 2018-09-05 NOTE — Telephone Encounter (Signed)
We have attempted to contact pt several times with no success or call back from pt. Per triage protocol, message will be closed.  

## 2018-09-13 ENCOUNTER — Other Ambulatory Visit (HOSPITAL_COMMUNITY): Payer: Self-pay | Admitting: Psychiatry

## 2018-09-24 ENCOUNTER — Other Ambulatory Visit: Payer: Self-pay

## 2018-09-24 ENCOUNTER — Ambulatory Visit (INDEPENDENT_AMBULATORY_CARE_PROVIDER_SITE_OTHER): Payer: Medicare Other | Admitting: Psychiatry

## 2018-09-24 DIAGNOSIS — F411 Generalized anxiety disorder: Secondary | ICD-10-CM

## 2018-09-24 DIAGNOSIS — F250 Schizoaffective disorder, bipolar type: Secondary | ICD-10-CM | POA: Diagnosis not present

## 2018-09-24 MED ORDER — ZIPRASIDONE HCL 20 MG PO CAPS: ORAL_CAPSULE | ORAL | 0 refills | Status: DC

## 2018-09-24 MED ORDER — ZIPRASIDONE HCL 40 MG PO CAPS: ORAL_CAPSULE | ORAL | 0 refills | Status: DC

## 2018-09-24 MED ORDER — CLONAZEPAM 1 MG PO TABS: 1.0000 mg | ORAL_TABLET | Freq: Two times a day (BID) | ORAL | 2 refills | Status: DC | PRN

## 2018-09-24 NOTE — Progress Notes (Signed)
[place] MD/PA/NP OP Progress Note  09/24/2018 11:08 AM Ian Grimes  MRN:  [id number] Interview was conducted by phone and I verified that I was speaking with the correct person using two identifiers. I discussed the limitations of evaluation and management by telemedicine and  the availability of in person appointments. Patient expressed understanding and agreed to proceed.  Chief Complaint: Anxiety  HPI: 47 yo male with schizoaffective disorder bipolar type and GAD.No overt psychosis (denies having hallucinations, no delusions), depression under good control but still some anxiety (primarily in the middle of the day) reported. Sertraline and ziprasidone doses were increased: 150 mg and 20 g am/40 mg HS, respectively. He tolerates both well. Sleep and appetite normal. Clonazepam works better than hydroxyzine for anxiety but he reports feeling mode anxious in the middle of the day and feels that am dose is wearing off quickly. He denies having SI or mood fluctuations.    Visit Diagnosis:    ICD-10-CM   1. Schizoaffective disorder, bipolar type ([place])  F25.0   2. GAD (generalized anxiety disorder)  F41.1     Past Psychiatric History: Please refer to intake H&P.  Past Medical History:  Past Medical History:  Diagnosis Date  . Anxiety   . Depression   . HA (headache)   . Hx of echocardiogram    Echo (10/15):  Mild LVH, EF 60-65%, no RWMA, trivial AI, mild LAE  . Hyperlipidemia   . Pleural effusion   . Pulmonary emboli ([place])   . Pulmonary nodule    follow up CT 6 months (due 9/15)  . Schizo-affective psychosis ([place])     Past Surgical History:  Procedure Laterality Date  . ASCENDING AORTIC ROOT REPLACEMENT N/A 11/14/2013   Procedure: Value sparing Root replacement, ASCENDING AORTIC ROOT REPLACEMENT, circ Arrest;  Surgeon: Gaye Pollack, MD;  Location: [place];  Service: Open Heart Surgery;  Laterality: N/A;  . CARDIAC CATHETERIZATION    . INTRAOPERATIVE TRANSESOPHAGEAL ECHOCARDIOGRAM N/A  11/14/2013   Procedure: INTRAOPERATIVE TRANSESOPHAGEAL ECHOCARDIOGRAM;  Surgeon: Gaye Pollack, MD;  Location: [facility] OR;  Service: Open Heart Surgery;  Laterality: N/A;  . LEFT AND RIGHT HEART CATHETERIZATION WITH CORONARY ANGIOGRAM N/A 11/01/2013   Procedure: LEFT AND RIGHT HEART CATHETERIZATION WITH CORONARY ANGIOGRAM;  Surgeon: Josue Hector, MD;  Location[facility] CATH LAB;  Service: Cardiovascular;  Laterality: N/A;  . rib injury      Family Psychiatric History: Reviewed.  Family History:  Family History  Problem Relation Age of Onset  . Heart disease Father   . Stroke Father   . Cancer Father        lung  . Heart attack Father   . COPD Father   . Alcohol abuse Father   . Diabetes Paternal Grandfather   . COPD Paternal 64   . Asthma Paternal Aunt   . Stroke Paternal Uncle     Social History:  Social History   Socioeconomic History  . Marital status: Legally Separated    Spouse name: Not on file  . Number of children: 0  . Years of education: Not on file  . Highest education level: GED or equivalent  Occupational History  . Occupation: none  Social Needs  . Financial resource strain: Not hard at all  . Food insecurity    Worry: Sometimes true    Inability: Sometimes true  . Transportation needs    Medical: No    Non-medical: No  Tobacco Use  . Smoking status: Current Every Day  Smoker    Packs/day: 1.00    Years: 22.00    Pack years: 22.00    Types: Cigarettes  . Smokeless tobacco: Never Used  Substance and Sexual Activity  . Alcohol use: No    Alcohol/week: 0.0 standard drinks    Comment: drinks 1 can of beer/month.  06-24-15 per pt no  . Drug use: No    Comment: 06-24-15 per pt no  . Sexual activity: Not Currently  Lifestyle  . Physical activity    Days per week: 5 days    Minutes per session: 60 min  . Stress: Very much  Relationships  . Social Herbalist on phone: Never    Gets together: Never    Attends religious service: Never    Active  member of club or organization: No    Attends meetings of clubs or organizations: Never    Relationship status: Separated  Other Topics Concern  . Not on file  Social History Narrative  . Not on file    Allergies:  Allergies  Allergen Reactions  . Bactrim Rash  . Bee Venom Other (See Comments)    " had a lot of bee stings at once"  Only hand swelling at site of stings    Metabolic Disorder Labs: Lab Results  Component Value Date   HGBA1C 5.6 11/12/2013   MPG 114 11/12/2013   No results found for: PROLACTIN Lab Results  Component Value Date   CHOL 234 (H) 12/02/2015   TRIG 333 (H) 12/02/2015   HDL 19 (L) 12/02/2015   CHOLHDL 12.3 (H) 12/02/2015   VLDL 33 05/06/2013   LDLCALC 148 (H) 12/02/2015   LDLCALC 102 (H) 08/25/2015   Lab Results  Component Value Date   TSH 1.302 05/07/2013    Therapeutic Level Labs: Lab Results  Component Value Date   LITHIUM <0.25 (L) 08/14/2008   LITHIUM <0.25 (L) 08/11/2008   Lab Results  Component Value Date   VALPROATE 14.5 (L) 06/26/2011   No components found for:  CBMZ  Current Medications: Current Outpatient Medications  Medication Sig Dispense Refill  . acetaminophen (TYLENOL) 500 MG tablet Take 500 mg every 6 (six) hours as needed by mouth (Pt takes 2-4 tabs prn.).    Marland Kitchen clonazePAM (KLONOPIN) 1 MG tablet Take 1 tablet (1 mg total) by mouth 2 (two) times daily as needed for anxiety. 60 tablet 2  . metoprolol tartrate (LOPRESSOR) 25 MG tablet Take 0.5 tablets (12.5 mg total) by mouth 2 (two) times daily. (Patient taking differently: Take 25 mg by mouth 2 (two) times daily. ) 60 tablet 4  . omeprazole (PRILOSEC) 20 MG capsule TAKE 1 CAPSULE BY MOUTH EVERY DAY 90 capsule 1  . pravastatin (PRAVACHOL) 80 MG tablet Take 80 mg by mouth at bedtime.   3  . sertraline (ZOLOFT) 100 MG tablet TAKE 1.5 TABLETS BY MOUTH DAILY 135 tablet 0  . umeclidinium-vilanterol (ANORO ELLIPTA) 62.5-25 MCG/INH AEPB Inhale 1 puff into the lungs daily.     . ziprasidone (GEODON) 20 MG capsule Take 1 capsule daily in the morning 90 capsule 0  . ziprasidone (GEODON) 40 MG capsule Take one capsule by mouth every evening 90 capsule 0   No current facility-administered medications for this visit.     Psychiatric Specialty Exam: Review of Systems  Psychiatric/Behavioral: The patient is nervous/anxious.   All other systems reviewed and are negative.   There were no vitals taken for this visit.There is no height or weight  on file to calculate BMI.  General Appearance: NA  Eye Contact:  NA  Speech:  Clear and Coherent and Normal Rate  Volume:  Normal  Mood:  Anxious  Affect:  NA  Thought Process:  Goal Directed  Orientation:  Full (Time, Place, and Person)  Thought Content: Logical   Suicidal Thoughts:  No  Homicidal Thoughts:  No  Memory:  Immediate;   Good Recent;   Good Remote;   Good  Judgement:  Good  Insight:  Fair  Psychomotor Activity:  NA  Concentration:  Concentration: Good  Recall:  Good  Fund of Knowledge: Good  Language: Good  Akathisia:  NA  Handed:  Right  AIMS (if indicated): not done  Assets:  Communication Skills Desire for Improvement Financial Resources/Insurance Housing Physical Health Resilience  ADL's:  Intact  Cognition: WNL  Sleep:  Good   Screenings: GAD-7     Office Visit from 12/02/2015 in [place] Visit from 10/19/2015 in [place]  Total GAD-7 Score  21  21    Mini-Mental     Clinical Support from 11/20/2014 in [place]  Total Score (max 30 points )  30    PHQ2-9     Office Visit from 06/03/2016 in [place] Office Visit from 12/02/2015 in [place] Office Visit from 10/19/2015 in [place] Office Visit from 08/25/2015 in [place] from 11/20/2014 in [place]  PHQ-2 Total  Score  4  6  6  6  6   PHQ-9 Total Score  16  24  23  24  20        Assessment and Plan: 48 yo male with schizoaffective disorder bipolar type and GAD.No overt psychosis (denies having hallucinations, no delusions), depression under good control but still some anxiety (primarily in the middle of the day) reported. Sertraline and ziprasidone doses were increased to150 mg and 20 mg am/40 mg HS, respectively, and he tolerates both well. Sleep and appetite normal. Clonazepam works better than hydroxyzine for anxiety but he reports feeling mode anxious in the middle of the day and feels that am dose is wearing off quickly. He denies having SI or mood fluctuations.   Plan: We will continue Geodon 20 mg in AM and 40 mg in HS and sertraline at 150 mg daily. Increase clonazepam to 1 mg bid prn anxiety/agitation. Patient will return for next follow up visit in 3 months. The plan was discussed with patient who had an opportunity to ask questions and these were all answered. I spend 25 minutes in phone consultation with the patient.     Stephanie Acre, MD 09/24/2018, 11:08 AM

## 2018-10-02 DIAGNOSIS — M25551 Pain in right hip: Secondary | ICD-10-CM | POA: Diagnosis not present

## 2018-10-02 DIAGNOSIS — S79911A Unspecified injury of right hip, initial encounter: Secondary | ICD-10-CM | POA: Diagnosis not present

## 2018-10-16 ENCOUNTER — Other Ambulatory Visit (HOSPITAL_COMMUNITY): Payer: Self-pay | Admitting: Psychiatry

## 2018-10-21 ENCOUNTER — Other Ambulatory Visit: Payer: Self-pay | Admitting: Pulmonary Disease

## 2018-10-21 ENCOUNTER — Other Ambulatory Visit (HOSPITAL_COMMUNITY): Payer: Self-pay | Admitting: Psychiatry

## 2018-10-21 DIAGNOSIS — K219 Gastro-esophageal reflux disease without esophagitis: Secondary | ICD-10-CM

## 2018-10-22 DIAGNOSIS — Z23 Encounter for immunization: Secondary | ICD-10-CM | POA: Diagnosis not present

## 2018-11-08 DIAGNOSIS — S80211A Abrasion, right knee, initial encounter: Secondary | ICD-10-CM | POA: Diagnosis not present

## 2018-11-08 DIAGNOSIS — S80212A Abrasion, left knee, initial encounter: Secondary | ICD-10-CM | POA: Diagnosis not present

## 2018-11-08 DIAGNOSIS — M25561 Pain in right knee: Secondary | ICD-10-CM | POA: Diagnosis not present

## 2018-11-08 DIAGNOSIS — R52 Pain, unspecified: Secondary | ICD-10-CM | POA: Diagnosis not present

## 2018-11-08 DIAGNOSIS — S8002XA Contusion of left knee, initial encounter: Secondary | ICD-10-CM | POA: Diagnosis not present

## 2018-11-08 DIAGNOSIS — S8001XA Contusion of right knee, initial encounter: Secondary | ICD-10-CM | POA: Diagnosis not present

## 2018-11-08 DIAGNOSIS — R531 Weakness: Secondary | ICD-10-CM | POA: Diagnosis not present

## 2018-11-08 DIAGNOSIS — F17200 Nicotine dependence, unspecified, uncomplicated: Secondary | ICD-10-CM | POA: Diagnosis not present

## 2018-11-08 DIAGNOSIS — Z79899 Other long term (current) drug therapy: Secondary | ICD-10-CM | POA: Diagnosis not present

## 2018-11-08 DIAGNOSIS — W109XXA Fall (on) (from) unspecified stairs and steps, initial encounter: Secondary | ICD-10-CM | POA: Diagnosis not present

## 2018-11-08 DIAGNOSIS — W19XXXA Unspecified fall, initial encounter: Secondary | ICD-10-CM | POA: Diagnosis not present

## 2018-11-08 DIAGNOSIS — M545 Low back pain: Secondary | ICD-10-CM | POA: Diagnosis not present

## 2018-11-08 DIAGNOSIS — S8991XA Unspecified injury of right lower leg, initial encounter: Secondary | ICD-10-CM | POA: Diagnosis not present

## 2018-11-08 DIAGNOSIS — M25562 Pain in left knee: Secondary | ICD-10-CM | POA: Diagnosis not present

## 2018-11-08 DIAGNOSIS — Z882 Allergy status to sulfonamides status: Secondary | ICD-10-CM | POA: Diagnosis not present

## 2018-11-09 ENCOUNTER — Telehealth (HOSPITAL_COMMUNITY): Payer: Self-pay

## 2018-11-09 NOTE — Telephone Encounter (Signed)
Medication management - Telephone call with pt to follow up on message he left stating he had increased his Klonopin to 3 a day, that he knew he should not have done this and will now be out 11 days early with 1 pill remaining.  Patient reported taking last dosage earlier today and has 1 whole pill remaining.  Informed this message would be sent to Dr. Montel Culver but there was no guarantee the medication would be allowed to be filled early or if Dr. Montel Culver would want to do any tapers with medications due to patient's concerns for any withdrawal symptoms that none were currently reported.  Patient stated understanding and message sent to Dr. Montel Culver. No medical side effects or concerns reported at this time.

## 2018-11-12 ENCOUNTER — Other Ambulatory Visit (HOSPITAL_COMMUNITY): Payer: Self-pay | Admitting: Psychiatry

## 2018-11-12 MED ORDER — CLONAZEPAM 1 MG PO TABS: 1.0000 mg | ORAL_TABLET | Freq: Three times a day (TID) | ORAL | 2 refills | Status: DC | PRN

## 2018-11-19 ENCOUNTER — Telehealth (HOSPITAL_COMMUNITY): Payer: Self-pay

## 2018-11-19 NOTE — Telephone Encounter (Signed)
Patient called me today, he has taken all of his Klonopin again, patient states that he does not do it on purpose, that he forgets that he has taken one and takes another. I told patient I would not be able to authorize an early fill and I would need to speak with Dr. Montel Culver. Patient became angry and asked what he is supposed to do if he starts having withdrawal. I advised patient if that happens then he should go to the emergency department. Patient picked up a prescription of 90 pills on 9/14 per the pharmacist. Patient states he is out.

## 2018-11-20 NOTE — Telephone Encounter (Signed)
I absolutely will not authorize early refill this time. I have increased his Rx from bid to tid last time and he still used them in two weeks! If in withdrawal he needs to go to ED.

## 2018-11-21 DIAGNOSIS — M79672 Pain in left foot: Secondary | ICD-10-CM | POA: Diagnosis not present

## 2018-11-21 DIAGNOSIS — S93602A Unspecified sprain of left foot, initial encounter: Secondary | ICD-10-CM | POA: Diagnosis not present

## 2018-11-21 DIAGNOSIS — S99922A Unspecified injury of left foot, initial encounter: Secondary | ICD-10-CM | POA: Diagnosis not present

## 2018-11-21 DIAGNOSIS — R600 Localized edema: Secondary | ICD-10-CM | POA: Diagnosis not present

## 2018-11-22 ENCOUNTER — Telehealth: Payer: Self-pay | Admitting: Cardiovascular Disease

## 2018-11-22 ENCOUNTER — Telehealth: Payer: Self-pay | Admitting: Internal Medicine

## 2018-11-22 DIAGNOSIS — F419 Anxiety disorder, unspecified: Secondary | ICD-10-CM | POA: Diagnosis not present

## 2018-11-22 DIAGNOSIS — E785 Hyperlipidemia, unspecified: Secondary | ICD-10-CM | POA: Diagnosis not present

## 2018-11-22 NOTE — Telephone Encounter (Signed)
Spoke with the pt  He states that he has decided to d/c all of his meds and stop seeing all of his doctors  He states he is tired of taking medications and "when it's my time to go, it's my time to go" I stated to him that we are happy to see him any time he needs [place] and if he changes his mind about seeking treatment to please give [place] a call. He verbalized understanding.

## 2018-11-22 NOTE — Telephone Encounter (Signed)
Is he depressed? Is he suicidal? Does he feel like hurting himself?  Let me know ASAP

## 2018-11-22 NOTE — Telephone Encounter (Signed)
Will let Dr. Nishan know. 

## 2018-11-22 NOTE — Telephone Encounter (Signed)
New Message   Patient wants the doctor to know he's never going to any doctor ever again.

## 2018-11-23 NOTE — Telephone Encounter (Signed)
ATC patient unable to reach, left message to call back. Called patient's Emergency contact, no answer no voicemail Called patient's aunt on contact list-not listed anymore ATC patient again, went straight to voicemail. Left another message to call office back to let us know he is ok.  Will route to MR as FYI and leave in triage to keep follow up on patient.

## 2018-11-23 NOTE — Telephone Encounter (Signed)
Patient called office back. He states he has no intentions of suicide or harming himself.   When he gets stressed he tends to cancel out everything going on in his life. HE has that was his [place] yesterday.  He does not want to stop any medications or seeing doctors.   Offered appointment with NP Monday or MR and patient states he needed time to arrange for transportation. So he wanted to see MR. Appointment made for Tuesday 10/13 with Dr. Chase Caller  Nothing further needed at this time.

## 2018-11-25 NOTE — Telephone Encounter (Signed)
Did he give a reason. Has schizophrenia we will see him for his AV disease anytime he wants Has significant lung disease and should f/u with pulmonary and get flu shot

## 2018-11-26 NOTE — Telephone Encounter (Signed)
Called patient about his message. Patient stated he was having a bad day last week and he is better now. Patient did say that Dr. Johnsie Cancel went to fast for him last time at their visit. Patient stated he had questions, but did not get a chance to ask them. Informed patient that he request for Dr. Johnsie Cancel to slow down and write his questions down before his visit, so he will not miss anything. Patient agreed to plan and scheduled his yearly visit in December. Patient already has follow up with Dr. Melvyn Novas.

## 2018-12-06 ENCOUNTER — Telehealth: Payer: Self-pay | Admitting: Internal Medicine

## 2018-12-06 DIAGNOSIS — R61 Generalized hyperhidrosis: Secondary | ICD-10-CM | POA: Diagnosis not present

## 2018-12-06 DIAGNOSIS — R457 State of emotional shock and stress, unspecified: Secondary | ICD-10-CM | POA: Diagnosis not present

## 2018-12-06 NOTE — Telephone Encounter (Signed)
Called and spoke to patient.  Patient stated that he doesn't currently have a PCP or mental health physician. Patient stated he fired his physicians and can't get them back. Patient reported that he is establishing with a new PCP on 12/17/2018.  Patient stated he took his last dose of Zoloft today and was wondering is Dr. Chase Caller would be willing to refill this medication until he is established with PCP. I let patient know that Dr. Chase Caller is not here in the office this week but that I would send him the message.   Dr. Chase Caller please advise.

## 2018-12-07 MED ORDER — SERTRALINE HCL 100 MG PO TABS: ORAL_TABLET | ORAL | 0 refills | Status: DC

## 2018-12-07 NOTE — Telephone Encounter (Signed)
Left message for patient to call. Will need to verify his pharmacy before sending in refill.

## 2018-12-07 NOTE — Telephone Encounter (Signed)
Pt returned call. Stated that he uses [facility] in [place]. If any further questions give call back

## 2018-12-07 NOTE — Telephone Encounter (Signed)
RX has been sent in for patient. Will close this message.

## 2018-12-07 NOTE — Telephone Encounter (Signed)
Yes that is fine

## 2018-12-11 ENCOUNTER — Ambulatory Visit: Payer: Medicare Other | Admitting: Internal Medicine

## 2018-12-11 ENCOUNTER — Ambulatory Visit (INDEPENDENT_AMBULATORY_CARE_PROVIDER_SITE_OTHER): Payer: Medicare Other | Admitting: Psychiatry

## 2018-12-11 ENCOUNTER — Other Ambulatory Visit (HOSPITAL_COMMUNITY): Payer: Self-pay

## 2018-12-11 DIAGNOSIS — F250 Schizoaffective disorder, bipolar type: Secondary | ICD-10-CM | POA: Diagnosis not present

## 2018-12-11 DIAGNOSIS — F411 Generalized anxiety disorder: Secondary | ICD-10-CM

## 2018-12-11 MED ORDER — SERTRALINE HCL 100 MG PO TABS: ORAL_TABLET | ORAL | 0 refills | Status: DC

## 2018-12-11 MED ORDER — ZIPRASIDONE HCL 20 MG PO CAPS: ORAL_CAPSULE | ORAL | 0 refills | Status: DC

## 2018-12-11 MED ORDER — ZIPRASIDONE HCL 40 MG PO CAPS: ORAL_CAPSULE | ORAL | 0 refills | Status: DC

## 2018-12-11 MED ORDER — CLONAZEPAM 1 MG PO TABS: 1.0000 mg | ORAL_TABLET | Freq: Three times a day (TID) | ORAL | 2 refills | Status: DC | PRN

## 2018-12-11 NOTE — Progress Notes (Signed)
[place] MD/PA/NP OP Progress Note  12/11/2018 11:08 AM Ian Grimes  MRN:  [id number] Interview was conducted by phone and I verified that I was speaking with the correct person using two identifiers. I discussed the limitations of evaluation and management by telemedicine and  the availability of in person appointments. Patient expressed understanding and agreed to proceed.  Chief Complaint: "I feel better this week".  HPI: 48 yo male with schizoaffective disorder bipolar type and GAD.No overt psychosis (denies having hallucinations, no delusions), depression under good control but still some anxiety(primarily in the middle of the day)reported. Sertraline andziprasidonedoses were increased to 150 mg and 20 mg am/40 mg HS, respectively, and he tolerates both well. Sleep and appetite normal. Clonazepam works better than hydroxyzine for anxiety but he would at times forget that he took one and take another. This caused him to run out early lost month but it has not been a problem since.  He denies having SI or mood fluctuations.   Visit Diagnosis:    ICD-10-CM   1. Schizoaffective disorder, bipolar type ([place])  F25.0   2. GAD (generalized anxiety disorder)  F41.1     Past Psychiatric History: Please see intake H&P.  Past Medical History:  Past Medical History:  Diagnosis Date  . Anxiety   . Depression   . HA (headache)   . Hx of echocardiogram    Echo (10/15):  Mild LVH, EF 60-65%, no RWMA, trivial AI, mild LAE  . Hyperlipidemia   . Pleural effusion   . Pulmonary emboli ([place])   . Pulmonary nodule    follow up CT 6 months (due 9/15)  . Schizo-affective psychosis ([place])     Past Surgical History:  Procedure Laterality Date  . ASCENDING AORTIC ROOT REPLACEMENT N/A 11/14/2013   Procedure: Value sparing Root replacement, ASCENDING AORTIC ROOT REPLACEMENT, circ Arrest;  Surgeon: Gaye Pollack, MD;  Location: [place];  Service: Open Heart Surgery;  Laterality: N/A;  . CARDIAC  CATHETERIZATION    . INTRAOPERATIVE TRANSESOPHAGEAL ECHOCARDIOGRAM N/A 11/14/2013   Procedure: INTRAOPERATIVE TRANSESOPHAGEAL ECHOCARDIOGRAM;  Surgeon: Gaye Pollack, MD;  Location: [facility] OR;  Service: Open Heart Surgery;  Laterality: N/A;  . LEFT AND RIGHT HEART CATHETERIZATION WITH CORONARY ANGIOGRAM N/A 11/01/2013   Procedure: LEFT AND RIGHT HEART CATHETERIZATION WITH CORONARY ANGIOGRAM;  Surgeon: Josue Hector, MD;  Location: [facility] CATH LAB;  Service: Cardiovascular;  Laterality: N/A;  . rib injury      Family Psychiatric History: Reviewed.  Family History:  Family History  Problem Relation Age of Onset  . Heart disease Father   . Stroke Father   . Cancer Father        lung  . Heart attack Father   . COPD Father   . Alcohol abuse Father   . Diabetes Paternal Grandfather   . COPD Paternal 82   . Asthma Paternal Aunt   . Stroke Paternal Uncle     Social History:  Social History   Socioeconomic History  . Marital status: Legally Separated    Spouse name: Not on file  . Number of children: 0  . Years of education: Not on file  . Highest education level: GED or equivalent  Occupational History  . Occupation: none  Social Needs  . Financial resource strain: Not hard at all  . Food insecurity    Worry: Sometimes true    Inability: Sometimes true  . Transportation needs    Medical: No    Non-medical: No  Tobacco Use  . Smoking status: Current Every Day Smoker    Packs/day: 1.00    Years: 22.00    Pack years: 22.00    Types: Cigarettes  . Smokeless tobacco: Never Used  Substance and Sexual Activity  . Alcohol use: No    Alcohol/week: 0.0 standard drinks    Comment: drinks 1 can of beer/month.  06-24-15 per pt no  . Drug use: No    Comment: 06-24-15 per pt no  . Sexual activity: Not Currently  Lifestyle  . Physical activity    Days per week: 5 days    Minutes per session: 60 min  . Stress: Very much  Relationships  . Social Herbalist on phone: Never     Gets together: Never    Attends religious service: Never    Active member of club or organization: No    Attends meetings of clubs or organizations: Never    Relationship status: Separated  Other Topics Concern  . Not on file  Social History Narrative  . Not on file    Allergies:  Allergies  Allergen Reactions  . Bactrim Rash  . Bee Venom Other (See Comments)    " had a lot of bee stings at once"  Only hand swelling at site of stings    Metabolic Disorder Labs: Lab Results  Component Value Date   HGBA1C 5.6 11/12/2013   MPG 114 11/12/2013   No results found for: PROLACTIN Lab Results  Component Value Date   CHOL 234 (H) 12/02/2015   TRIG 333 (H) 12/02/2015   HDL 19 (L) 12/02/2015   CHOLHDL 12.3 (H) 12/02/2015   VLDL 33 05/06/2013   LDLCALC 148 (H) 12/02/2015   LDLCALC 102 (H) 08/25/2015   Lab Results  Component Value Date   TSH 1.302 05/07/2013    Therapeutic Level Labs: Lab Results  Component Value Date   LITHIUM <0.25 (L) 08/14/2008   LITHIUM <0.25 (L) 08/11/2008   Lab Results  Component Value Date   VALPROATE 14.5 (L) 06/26/2011   No components found for:  CBMZ  Current Medications: Current Outpatient Medications  Medication Sig Dispense Refill  . acetaminophen (TYLENOL) 500 MG tablet Take 500 mg every 6 (six) hours as needed by mouth (Pt takes 2-4 tabs prn.).    Marland Kitchen clonazePAM (KLONOPIN) 1 MG tablet Take 1 tablet (1 mg total) by mouth 3 (three) times daily as needed for anxiety. 90 tablet 2  . metoprolol tartrate (LOPRESSOR) 25 MG tablet Take 0.5 tablets (12.5 mg total) by mouth 2 (two) times daily. (Patient taking differently: Take 25 mg by mouth 2 (two) times daily. ) 60 tablet 4  . omeprazole (PRILOSEC) 20 MG capsule TAKE 1 CAPSULE BY MOUTH EVERY DAY 90 capsule 1  . pravastatin (PRAVACHOL) 80 MG tablet Take 80 mg by mouth at bedtime.   3  . [START ON 01/07/2019] sertraline (ZOLOFT) 100 MG tablet TAKE 1.5 TABLETS BY MOUTH DAILY 90 tablet 0  .  umeclidinium-vilanterol (ANORO ELLIPTA) 62.5-25 MCG/INH AEPB Inhale 1 puff into the lungs daily.    . ziprasidone (GEODON) 20 MG capsule Take 1 capsule daily in the morning 90 capsule 0  . ziprasidone (GEODON) 40 MG capsule Take one capsule by mouth every evening 90 capsule 0   No current facility-administered medications for this visit.      Review of Systems  Psychiatric/Behavioral: The patient is nervous/anxious.   All other systems reviewed and are negative.   There were no  vitals taken for this visit.There is no height or weight on file to calculate BMI.  General Appearance: NA  Eye Contact:  NA  Speech:  Clear and Coherent and Normal Rate  Volume:  Normal  Mood:  Anxious  Affect:  NA  Thought Process:  Goal Directed and Linear  Orientation:  Full (Time, Place, and Person)  Thought Content: Logical   Suicidal Thoughts:  No  Homicidal Thoughts:  No  Memory:  Immediate;   Fair Recent;   Fair Remote;   Good  Judgement:  Fair  Insight:  Fair  Psychomotor Activity:  NA  Concentration:  Concentration: Good  Recall:  [place] of Knowledge: Fair  Language: Good  Akathisia:  Negative  Handed:  Right  AIMS (if indicated): not done  Assets:  Communication Skills Desire for Improvement Financial Resources/Insurance Housing Resilience  ADL's:  Intact  Cognition: WNL  Sleep:  Good   Screenings: GAD-7     Office Visit from 12/02/2015 in [place] Visit from 10/19/2015 in [place]  Total GAD-7 Score  21  21    Mini-Mental     Clinical Support from 11/20/2014 in [place]  Total Score (max 30 points )  30    PHQ2-9     Office Visit from 06/03/2016 in [place] Office Visit from 12/02/2015 in [place] Office Visit from 10/19/2015 in [place] Office Visit from 08/25/2015 in [place] from 11/20/2014 in [place]  PHQ-2 Total Score  4  6  6  6  6   PHQ-9 Total Score  16  24  23  24  20        Assessment and Plan: 48 yo male with schizoaffective disorder bipolar type and GAD.No overt psychosis (denies having hallucinations, no delusions), depression under good control but still some anxiety(primarily in the middle of the day)reported. Sertraline andziprasidonedoses were increased to 150 mg and 20 mg am/40 mg HS, respectively, and he tolerates both well. Sleep and appetite normal. Clonazepam works better than hydroxyzine for anxiety but he would at times forget that he took one and take another. This caused him to run out early lost month but it has not been a problem since.  He denies having SI or mood fluctuations.   Dx: Schizoaffective disorder bipolar type; GAD  Plan: We willcontinueGeodon 20 mg in AM and 40 mg in HSandsertraline at 150mg daily. Increase clonazepam to 1 mg tid prn anxiety/agitation.Patient will return for next follow up visit in38months. The plan was discussed with patient who had an opportunity to ask questions and these were all answered. I spend 25 minutes in phone consultation with the patient.    Stephanie Acre, MD 12/11/2018, 11:08 AM

## 2018-12-22 ENCOUNTER — Other Ambulatory Visit (HOSPITAL_COMMUNITY): Payer: Self-pay | Admitting: Psychiatry

## 2018-12-24 ENCOUNTER — Encounter: Payer: Self-pay | Admitting: Internal Medicine

## 2018-12-24 ENCOUNTER — Ambulatory Visit (INDEPENDENT_AMBULATORY_CARE_PROVIDER_SITE_OTHER): Payer: Medicare Other | Admitting: Internal Medicine

## 2018-12-24 ENCOUNTER — Other Ambulatory Visit: Payer: Self-pay

## 2018-12-24 VITALS — BP 124/82 | HR 62 | Temp 97.7°F | Ht 72.0 in | Wt 232.4 lb

## 2018-12-24 DIAGNOSIS — J449 Chronic obstructive pulmonary disease, unspecified: Secondary | ICD-10-CM | POA: Diagnosis not present

## 2018-12-24 DIAGNOSIS — Z86711 Personal history of pulmonary embolism: Secondary | ICD-10-CM | POA: Diagnosis not present

## 2018-12-24 DIAGNOSIS — E8801 Alpha-1-antitrypsin deficiency: Secondary | ICD-10-CM

## 2018-12-24 DIAGNOSIS — Z720 Tobacco use: Secondary | ICD-10-CM | POA: Diagnosis not present

## 2018-12-24 MED ORDER — ANORO ELLIPTA 62.5-25 MCG/INH IN AEPB: 1.0000 | INHALATION_SPRAY | Freq: Every day | RESPIRATORY_TRACT | 5 refills | Status: DC

## 2018-12-24 NOTE — Progress Notes (Signed)
IOV 06/12/2013  Chief Complaint  Patient presents with   Advice Only    Referred for pulmonary nodule.  Had CT chest at the end of March.  Has no breathing complaints at this time.    48 year old smoker with significant psychiatric history but a fairly good historian also with anxiety, hyperlipidemia and smoking. He was admitted 05/06/2013 with 3 week history of dyspnea. Was diagnosed with bilateral pulmonary embolism. Treated by the hospitalist service. He was on 2 L nasal cannula with a respirator 30 per minute at the time of admission. He was discharged 05/08/2013 on xarelto. Since then he has returned to baseline health. He is followed up with his primary care physician. He says that for the last 1 week he is not taking his xarelto because his primary care physician apparently told him that the refills are actually once a month. He is also confused that his primary care physician has been regarding that he is on Coumadin but he insists that he wants on xarelto up until one week ago and that there was no change in plan to take any other medication. He had tolerated xarelto well without any problems of bleeding or compliance issues.  CT scan of the chest at that time should 8 mm subpleural nodule in the lateral segment of the right middle lobe and that is the main reason for referral today  Other she smoking: He continues to smoke and is unable to quit.  reports that he has been smoking Cigarettes.  He has a 22 pack-year smoking history. He has never used smokeless tobacco.  IMPRESSION:   CT chest 05/06/13 Segmental pulmonary emboli within all lobes. Overall clot burden is  moderate.  8 x 6 mm triangular subpleural nodule in the lateral right middle  lobe, corresponding to the radiographic abnormality, favored to  reflect a benign subpleural lymph node but technically  indeterminate. Given patient's high risk for primary bronchogenic  neoplasm, initial follow-up CT chest is suggested in  6 months.  Faint ground-glass nodular opacities in the left upper lobe/lingula,  possibly infectious/inflammatory.  Trace right pleural effusion.  Nodule recommendation follows the consensus statement: Guidelines  for Management of Small Pulmonary Nodules Detected on CT Scans: A  Statement from the [employer] as published in Radiology  2005; 237:395-400.  Critical value/emergent results were called by telephone at the time  of interpretation on 05/06/2013 at 9:12 AM to [facility], who  verbally acknowledged these results.  Electronically Signed  By: Julian Hy M.D.  On: 05/06/2013 09:35         OV 08/19/2013  Chief Complaint  Patient presents with   Follow-up    Pt states breathing is unchanged. Pt states he only coughs to clear his throat. Pt states he constant sinus headaches. Pt denies SOB and CP.     Folllowup   Smoking: still smokes. Struggling to q uit. Depression makes it harder to quit  PE: back on xaretlo 46m per day after med review. He has no problems. MOwed yard 4h and no dyspnea   Lung nodule: has resolved June 2015 CT chest but new finding: Ascending Aorta Aneurumsm 4.5cm on CT chest   IMPRESSION:  1. No suspicious pulmonary nodules. Subpleural density previously  noted at the right lung base has resolved, likely atelectasis or  sequela of pulmonary embolism demonstrated on prior examination.  2. Mild centrilobular and paraseptal emphysema.  3. Ascending aortic aneurysm appears mildly enlarged compared with  the prior study.  Of note, previously demonstrated pulmonary embolism  is not addressed on this noncontrast study.  Electronically Signed  By: Camie Patience M.D.  On: 08/07/2013 12:30     OV 11/19/2014  Chief Complaint  Patient presents with   Follow-up    Pt c/o mild DOE, prod cough with clear mucus. Pt deneis CP/tightness.     SMoking:  reports that he has been smoking Cigarettes.  He has a 22 pack-year smoking history. He  has never used smokeless tobacco. CT scan of the chest in 2015 did show some subclinical emphysema  Pulmonary embolism: This was diagnosed in the spring of 2015. He completed one year Xarelto therapy earlier this year and I believe either under the instructions of primary care physician or cardiologist he came off therapy. He has since been referred here to follow-up. He feels fine without any chest pain hemoptysis syncope cough or dyspnea. The only amount of dyspnea he has this when he initially starts walking but as he warms up he does not have any dyspnea. His effort tolerance is good. Cardiologist wondered about hypercoagulable workup.     OV 06/01/2015  Chief Complaint  Patient presents with   Follow-up    Pt here for 6 months f/u. Pt states his breathing is unchanged since last OV. Pt c/o prod cough with clear thin mucus and chest tightness at random times.     48 year old male  0- has subclinical emphysema: CT chest June 2015 without any nodules. He reports ongoing shortness of breath with exertion for walking over a block on level ground. This is stable. Insidious onset for the last 3-4 years. Associated with being overweight. No  Pulmonary function tests this visit  - Alpha 1 genetic deficiency: Last visit I checked his alpha-1. His level was 76. Phenotype was  SZ and abnormal. Shed results with him again  - Pulmonary embolism: Completed one year of therapy summer 2016 since then no clinical evidence of pulmonary embolism or DVT. He continues on aspirin therapy. D-dimer at last visit was normal suggesting recurrence risk is low  - Smoking: He continues to smoke. He knows he needs to quit but is finding it difficult  Past medical history: He has chronic headaches. He canceled an MRI due to concern of prosthesis and acyanotic. He'll be with his neurologist again    has a past medical history of Schizo-affective psychosis ([place]); Anxiety; Depression; HA (headache); Hyperlipidemia;  Pulmonary emboli ([place]); Pulmonary nodule; Pleural effusion; and echocardiogram.   reports that he has been smoking Cigarettes.  He has a 22 pack-year smoking history. He has never used smokeless tobacco.  OV 01/05/2016  Chief Complaint  Patient presents with   Follow-up    Pt here after PFT. Pt denies changes in SOB since last OV. Pt c/o prod cough with clear mucus. Pt states he is getting over a cold. Pt denies CP/tightness and f/c/s.     48 year old male with smoking history, emphysema and history of pulmonary embolism status post completion of anticoagulation. This in the background setting of schizoaffective disorder and taking multiple psychiatric drugs  Emphysema associated with alpha-1 SZ: He says his breathing is stable. He has class II dyspnea and exertion and he notices this when walking up a steep incline. It is relieved by rest. This no associated chest pain. The cough is associated as mild and dry. No diaphoresis or palpitations. Overall symptoms are stable compared to 2 years ago. However he did have pulmonary function test that is documented below  and shows decline both in the prebronchodilator elements and the post bronchodilator elements and diffusion capacity. His alpha-1 deficiency. He is aware of this. I'm not so sure that he has to talk to his family members about it.  Smoking history: He continues to smoke. He says it helps on his notes. He says it'll be very difficult to quit. His psychiatrist is Dr. Sima Matas in [place]. He says he willing to take his help in trying to quit smoking.  Pulmonary Embolism: Last taken anticoagulation agent months ago. D-dimer was normal. He is on daily aspirin therapy. He says he is compliant with aspirin. No symptoms of recurrence.   OV 04/11/2016  Chief Complaint  Patient presents with   Follow-up    Pt. states he feels like his breathing has remained unchanged,He is still coughing but it has been better  But he still has sinus  headaches, Denies chest tightness,fever   Follow-up  History of pulmonary embolism: He is on baby aspirin prophylaxis. He says he is compliant with it. No clinical evidence of recurrence based on history this visit  Alpha-1 antitrypsin deficiency SZ with low levels with emphysema: He is taking Spiriva . He says he spoke to his aunt about his alpha-1 and he says the whole family knows about the alpha-1 deficiency but they failed to inform him. He is unsure whether he should take replacement and he wants to read some information about it. However he has not quit smoking. Overall COPD stable with a cat score of 17   smoking history: He continues to smoke. He is unable to quit. Now he tells me that his psychiatrist is actually Dr. Harrington Challenger and when he met with her in [place] she said that she would not be the right MD  to help him with quitting smoking.  He understands the need to quit     OV 07/11/2016  Chief Complaint  Patient presents with   Follow-up    Pt here after PFT. Pt denies change in breathing since last OV, denies cough and f/c/s. Pt states he had an episode of right sided chest tightness that lasted 20 min, pt states he was resting at the time of presentation.     Follow-up stage II COPD alpha-1 SZ the with low levels : Overall he's stable. He is currently not on any scheduled inhaler therapy due to insurance reasons and confusion as to which inhaler would be covered.. Nevertheless he feels stable and well. He had animals spirometry because of his alpha-1 and it is actually improved compared to last year.  In terms of smoking: He continues to smoke and difficult to quit  Pulmonary embolism history: He continues on baby aspirin and has not had any problems   OV 01/17/2017  Chief Complaint  Patient presents with   Follow-up    Pt states that he is doing better. Denies any real complaints of cough, SOB, or CP.    Follow-up multiple issues  Gold stage II COPD alpha-1 SZ  with low levels: There is a 65-monthfollow-up.  Symptom wise he is stable.  His COPD CAD score below has improved and this is without any inhaler therapy.  I gave him sample inhalers he said it did not make any difference.  He did not price these inhalers.  He is not even using albuterol as needed.  I reeducated him about the need for maintenance.  We decided to keep it simple and just use Spiriva  Smoking: He has associated depression  and he finds it hard to quit.  According to the primary care notes the depression itself is stable  Pulmonary embolism history: He continues on baby aspirin has not had any problems   OV 07/19/2017  Chief Complaint  Patient presents with   Follow-up    PFT done today. Pt states he has been doing good since last visit. Denies any real complaints.   Gold stage II COPD Alpha-1 deficiency SZ\smoking  Presents for routine follow-up.  Overall feels stable.  Pulmonary function test shows a decline in lung function although diffusion capacity is the same.  He still continues to smoke.  In talking to him it appears as anticholinergics was sent to wrong pharmacy and therefore is not on any anticholinergic.  He knows he is to quit smoking.  There are no other new issues.   OV 11/21/2017  Subjective:  Patient ID: Ian Grimes, male , DOB: 1970/06/13 , age 39 y.o. , MRN: [id number] , ADDRESS: [address] [place] [id number]   11/21/2017 -   Chief Complaint  Patient presents with   Follow-up    PFT performed today. Pt states he has been doing well since last visit and denies any complaints.   Follow-up Gold stage II COPD, smoking, alpha-1 antitrypsin SZ deficiency  HPI Ian Grimes 48 y.o. -presents for follow-up of the above 3 problems.  In the interim he has restarted his anticholinergic inhalers Incruse.  His symptom scores are slightly better COPD CAT score 10.  He is not on any other inhaler.  His lung function shows stability compared to  May 2019 in terms of FEV1 but his DLCO shows a decline as shown below.  Review of his pulmonary function test in the last 4 years shows continued and progressive decline.  This is on account of his alpha-1 and ongoing smoking.  He tells me that in an ideal world he would like to quit but given his schizoaffective disorder he has been unable to quit.  Without cigarettes he gets very nervous.  He did not tolerate Zyban many years ago.  He sees a psychiatrist once a month because his schizoaffective disorder requires intense monitoring.  He does not have any firearms.  At this point in time he is not suicidal or homicidal.  He will have a flu shot today.   OV 03/06/2018  Subjective:  Patient ID: Ian Grimes, male , DOB: Oct 09, 1970 , age 52 y.o. , MRN: [id number] , ADDRESS: [address] [place] [id number]   03/06/2018 -   Chief Complaint  Patient presents with   Follow-up    Pt states he has been the same since last visit. States he will be beginning Prolastin tomorrow, 03/07/2018. Denies any current complaints of cough, SOB, or CP.     HPI Ian Grimes 48 y.o. -  Follow-up alpha-1 antitrypsin deficiency: He is finally going to start Prolastin tomorrow.  First dose will be at [facility] because of a bee sting allergy.  If all goes well then he will get his infusions at home  Moderate COPD progressively worse: Currently stable without any exacerbation.  COPD CAT score is listed below.  He is compliant with his Anoro.  He is up-to-date with his flu shot.  He is never had pneumonia vaccine in the past he is willing to have it today.  Reports no allergy  History of pulmonary embolism in 2015 with treatment of 2016 and  normal d-dimer in 2016: He was supposed to be on baby aspirin but I do not see this on the med list.  He is denying any PE symptoms  Smoking history: He did not start himself on Chantix.  Using e-cigarettes brought in like open market he was able to reduce to 1  cigarette/day.  However given the vaping epidemic of acute lung injury he was advised to stop this and I was conventional cigarette smoking is relapsed to 1 pack a day.  He wants to restart electronic cigarettes.   OV 12/24/2018  Subjective:  Patient ID: Ian Grimes, male , DOB: 03/28/1970 , age 54 y.o. , MRN: [id number] , ADDRESS: [address] [place]  [id number]   12/24/2018 -   Chief Complaint  Patient presents with   Follow-up    Patient reports that he is doing good at this time and has lost some weight.    Follow-up for   ICD-10-CM   1. Alpha-1-antitrypsin deficiency ([place])  E88.01   2. Stage 2 moderate COPD by GOLD classification ([place])  J44.9   3. Tobacco use  Z72.0   4. History of pulmonary embolism  Z86.711      HPI Ian Grimes 48 y.o. -   Alpha-1 antitrypsin deficiency: He is getting Prolastin infusions at home.  He feels this is helping.  No exacerbations.  COPD CAT score itself is stable.   Stage II gold COPD: COPD CAT score is 17.  He has run out of his inhalers after his insurance change and his primary care doctor change.  He wants a refill.  He has had his flu shot.  Last Pneumovax was in 2020.  He is eligible for Prevnar sometime in 2021.  Last chest x-ray and CT scan 2015  Smoking: He continues to struggle to quit on account of his psychiatric issues   History of pulmonary embolism: He says he is taking preventative aspirin 81 mg daily.  It is not made in his medication list    CAT COPD Symptom & Quality of Life Score ([place]) 0 is no burden. 5 is highest burden 04/11/2016  01/17/2017  11/21/2017   03/06/2018  12/24/2018   Never Cough -> Cough all the time  _0 No phlegm in chest -> Chest is full of phlegm _1 No chest tightness -> Chest feels very tight 1 0 1 2 0  No dyspnea for 1 flight stairs/hill -> Very dyspneic for 1 flight of stairs _2 No limitations for ADL at home -> Very limited with ADL at home  0 0 0 1 1  Confident leaving home -> Not at all confident leaving home 5 0 0 2 1  Sleep soundly -> Do not sleep soundly because of lung condition _3 Lots of Energy -> No energy at all _4 TOTAL Score (max 40)  _5 Results for Grimes, Ian FRANKSON (MRN [id number]) as of 11/21/2017 10:08  Ref. Range 11/12/2013 14:38 01/05/2016 10:28 07/11/2016 11:22 07/19/2017 10:35 11/21/2017 08:57  FEV1-Pre Latest Units: L 2.68 2.47 2.61 2.32 2.31  FEV1-%Pred-Pre Latest Units: % 66 62 66 59 59   Results for Grimes, Ian SERNA (MRN [id number]) as of 11/21/2017 10:08  Ref. Range 11/12/2013 14:38 01/05/2016 10:28 07/11/2016 11:22 07/19/2017 10:35 11/21/2017 08:57  DLCO unc Latest Units:  ml/min/mmHg 26.56 22.42 24.74 21.99 18.96  DLCO unc % pred Latest Units: % 85 72 79 70 61    ROS - per HPI     has a past medical history of Anxiety, Depression, HA (headache), echocardiogram, Hyperlipidemia, Pleural effusion, Pulmonary emboli ([place]), Pulmonary nodule, and Schizo-affective psychosis ([place]).   reports that he has been smoking cigarettes. He has a 22.00 pack-year smoking history. He has never used smokeless tobacco.  Past Surgical History:  Procedure Laterality Date   ASCENDING AORTIC ROOT REPLACEMENT N/A 11/14/2013   Procedure: Value sparing Root replacement, ASCENDING AORTIC ROOT REPLACEMENT, circ Arrest;  Surgeon: Gaye Pollack, MD;  Location: [facility];  Service: Open Heart Surgery;  Laterality: N/A;   CARDIAC CATHETERIZATION     INTRAOPERATIVE TRANSESOPHAGEAL ECHOCARDIOGRAM N/A 11/14/2013   Procedure: INTRAOPERATIVE TRANSESOPHAGEAL ECHOCARDIOGRAM;  Surgeon: Gaye Pollack, MD;  Location: [facility];  Service: Open Heart Surgery;  Laterality: N/A;   LEFT AND RIGHT HEART CATHETERIZATION WITH CORONARY ANGIOGRAM N/A 11/01/2013   Procedure: LEFT AND RIGHT HEART CATHETERIZATION WITH CORONARY ANGIOGRAM;  Surgeon: Josue Hector, MD;  Location: [facility] CATH LAB;  Service: Cardiovascular;  Laterality: N/A;    rib injury      Allergies  Allergen Reactions   Bactrim Rash   Bee Venom Other (See Comments)    " had a lot of bee stings at once"  Only hand swelling at site of stings    Immunization History  Administered Date(s) Administered   Influenza,inj,Quad PF,6+ Mos 01/17/2017, 11/21/2017, 10/22/2018   Pneumococcal Polysaccharide-23 03/06/2018    Family History  Problem Relation Age of Onset   Heart disease Father    Stroke Father    Cancer Father        lung   Heart attack Father    COPD Father    Alcohol abuse Father    Diabetes Paternal Grandfather    COPD Paternal Aunt    Asthma Paternal Aunt    Stroke Paternal Uncle      Current Outpatient Medications:    acetaminophen (TYLENOL) 500 MG tablet, Take 500 mg every 6 (six) hours as needed by mouth (Pt takes 2-4 tabs prn.)., Disp: , Rfl:    clonazePAM (KLONOPIN) 1 MG tablet, Take 1 tablet (1 mg total) by mouth 3 (three) times daily as needed for anxiety., Disp: 90 tablet, Rfl: 2   metoprolol tartrate (LOPRESSOR) 25 MG tablet, Take 0.5 tablets (12.5 mg total) by mouth 2 (two) times daily. (Patient taking differently: Take 25 mg by mouth 2 (two) times daily. ), Disp: 60 tablet, Rfl: 4   omeprazole (PRILOSEC) 20 MG capsule, TAKE 1 CAPSULE BY MOUTH EVERY DAY, Disp: 90 capsule, Rfl: 1   pravastatin (PRAVACHOL) 80 MG tablet, Take 80 mg by mouth at bedtime. , Disp: , Rfl: 3   [START ON 01/07/2019] sertraline (ZOLOFT) 100 MG tablet, TAKE 1.5 TABLETS BY MOUTH DAILY, Disp: 90 tablet, Rfl: 0   umeclidinium-vilanterol (ANORO ELLIPTA) 62.5-25 MCG/INH AEPB, Inhale 1 puff into the lungs daily., Disp: , Rfl:    ziprasidone (GEODON) 20 MG capsule, Take 1 capsule daily in the morning, Disp: 90 capsule, Rfl: 0   ziprasidone (GEODON) 40 MG capsule, Take one capsule by mouth every evening, Disp: 90 capsule, Rfl: 0      Objective:   Vitals:   12/24/18 0924 12/24/18 0925  BP:  124/82  Pulse:  62  Temp: 97.7 F (36.5 C)     TempSrc: Temporal   SpO2:  97%  Weight:  105.4 kg   Height: 6' (1.829 m)     Estimated body mass index is 31.52 kg/m as calculated from the following:   Height as of this encounter: 6' (1.829 m).   Weight as of this encounter: 105.4 kg.  _0 @  Filed Weights   12/24/18 0924  Weight: 105.4 kg     Physical Exam  General Appearance:    Alert, cooperative, no distress, appears stated age - older , Deconditioned looking - no , OBESE  - yes, Sitting on Wheelchair -  no  Head:    Normocephalic, without obvious abnormality, atraumatic  Eyes:    PERRL, conjunctiva/corneas clear,  Ears:    Normal TM's and external ear canals, both ears  Nose:   Nares normal, septum midline, mucosa normal, no drainage    or sinus tenderness. OXYGEN ON  - no . Patient is @ ra   Throat:   Lips, mucosa, and tongue normal; teeth and gums normal. Cyanosis on lips - no  Neck:   Supple, symmetrical, trachea midline, no adenopathy;    thyroid:  no enlargement/tenderness/nodules; no carotid   bruit or JVD  Back:     Symmetric, no curvature, ROM normal, no CVA tenderness  Lungs:     Distress - no , Wheeze no, Barrell Chest - no, Purse lip breathing - no, Crackles - no   Chest Wall:    No tenderness or deformity.    Heart:    Regular rate and rhythm, S1 and S2 normal, no rub   or gallop, Murmur - no  Breast Exam:    NOT DONE  Abdomen:     Soft, non-tender, bowel sounds active all four quadrants,    no masses, no organomegaly. Visceral obesity - yes  Genitalia:   NOT DONE  Rectal:   NOT DONE  Extremities:   Extremities - normal, Has Cane - no, Clubbing - no, Edema - no  Pulses:   2+ and symmetric all extremities  Skin:   Stigmata of Connective Tissue Disease - no  Lymph nodes:   Cervical, supraclavicular, and axillary nodes normal  Psychiatric:  Neurologic:   Pleasant - yes, Anxious - no, Flat affect - YES  CAm-ICU - neg, Alert and Oriented x 3 - yes, Moves all 4s - yes, Speech - normal, Cognition -  intact           Assessment:       ICD-10-CM   1. Alpha-1-antitrypsin deficiency ([place])  E88.01   2. Stage 2 moderate COPD by GOLD classification ([place])  J44.9   3. Tobacco use  Z72.0   4. History of pulmonary embolism  Z86.711        Plan:     Patient Instructions  Alpha-1-antitrypsin deficiency ([place]) -Continue Prolastin infusions at home since January 2020   Stage 2 moderate COPD by GOLD classification ([place]) -Clinically stable disease -Continue Anoro daily and albuterol as needed (see me we will do refills as per your request -Glad you are up-to-date with flu shot -We will give you Prevnar pneumonia vaccine [second type] sometime in 2021 -Can consider chest x-ray at follow-up [not eligible for low-dose CT scan screening]  Tobacco use -I understand this is struggle to quit  History of pulmonary embolism -2015 with treatment through 2016 and normal d-dimer in 2016 -Glad you are taking aspirin 81 mg daily.  Please continue this  -CMA will update your medication list   Follow-up -6 months or sooner if needed; CAT score at follow-up  SIGNATURE    Dr. Brand Males, M.D., F.C.C.P,  Pulmonary and Critical Care Medicine Staff Physician, [facility] Director - Interstitial Lung Disease  Program  Pulmonary [place] at [place], [place], [address]  Pager: [phone], If no answer or between  15:00h - 7:00h: call [phone] Telephone: [phone]  9:45 AM 12/24/2018

## 2018-12-24 NOTE — Patient Instructions (Addendum)
Alpha-1-antitrypsin deficiency ([place]) -Continue Prolastin infusions at home since January 2020   Stage 2 moderate COPD by GOLD classification ([place]) -Clinically stable disease -Continue Anoro daily and albuterol as needed (see me we will do refills as per your request -Glad you are up-to-date with flu shot -We will give you Prevnar pneumonia vaccine [second type] sometime in 2021 -Can consider chest x-ray at follow-up [not eligible for low-dose CT scan screening]  Tobacco use -I understand this is struggle to quit  History of pulmonary embolism -2015 with treatment through 2016 and normal d-dimer in 2016 -Glad you are taking aspirin 81 mg daily.  Please continue this  -CMA will update your medication list   Follow-up -6 months or sooner if needed; CAT score at follow-up

## 2019-01-02 ENCOUNTER — Other Ambulatory Visit: Payer: Self-pay

## 2019-01-03 ENCOUNTER — Encounter: Payer: Self-pay | Admitting: Family Medicine

## 2019-01-03 ENCOUNTER — Ambulatory Visit (INDEPENDENT_AMBULATORY_CARE_PROVIDER_SITE_OTHER): Payer: Medicare Other | Admitting: Family Medicine

## 2019-01-03 VITALS — BP 109/75 | HR 68 | Temp 97.1°F | Ht 72.0 in | Wt 233.0 lb

## 2019-01-03 DIAGNOSIS — M25561 Pain in right knee: Secondary | ICD-10-CM | POA: Diagnosis not present

## 2019-01-03 DIAGNOSIS — J449 Chronic obstructive pulmonary disease, unspecified: Secondary | ICD-10-CM

## 2019-01-03 DIAGNOSIS — F250 Schizoaffective disorder, bipolar type: Secondary | ICD-10-CM

## 2019-01-03 DIAGNOSIS — E785 Hyperlipidemia, unspecified: Secondary | ICD-10-CM

## 2019-01-03 DIAGNOSIS — Z0000 Encounter for general adult medical examination without abnormal findings: Secondary | ICD-10-CM

## 2019-01-03 DIAGNOSIS — Z720 Tobacco use: Secondary | ICD-10-CM | POA: Diagnosis not present

## 2019-01-03 DIAGNOSIS — Z95828 Presence of other vascular implants and grafts: Secondary | ICD-10-CM

## 2019-01-03 DIAGNOSIS — G8929 Other chronic pain: Secondary | ICD-10-CM

## 2019-01-03 DIAGNOSIS — Z0001 Encounter for general adult medical examination with abnormal findings: Secondary | ICD-10-CM | POA: Diagnosis not present

## 2019-01-03 DIAGNOSIS — E8801 Alpha-1-antitrypsin deficiency: Secondary | ICD-10-CM

## 2019-01-03 DIAGNOSIS — J329 Chronic sinusitis, unspecified: Secondary | ICD-10-CM | POA: Diagnosis not present

## 2019-01-03 DIAGNOSIS — F411 Generalized anxiety disorder: Secondary | ICD-10-CM | POA: Diagnosis not present

## 2019-01-03 DIAGNOSIS — Z23 Encounter for immunization: Secondary | ICD-10-CM

## 2019-01-03 NOTE — Patient Instructions (Addendum)
Arthritis Arthritis means joint pain. It can also mean joint disease. A joint is a place where bones come together. There are more than 100 types of arthritis. What are the causes? This condition may be caused by:  Wear and tear of a joint. This is the most common cause.  A lot of acid in the blood, which leads to pain in the joint (gout).  Pain and swelling (inflammation) in a joint.  Infection of a joint.  Injuries in the joint.  A reaction to medicines (allergy). In some cases, the cause may not be known. What are the signs or symptoms? Symptoms of this condition include:  Redness at a joint.  Swelling at a joint.  Stiffness at a joint.  Warmth coming from the joint.  A fever.  A feeling of being sick. How is this treated? This condition may be treated with:  Treating the cause, if it is known.  Rest.  Raising (elevating) the joint.  Putting cold or hot packs on the joint.  Medicines to treat symptoms and reduce pain and swelling.  Shots of medicines (cortisone) into the joint. You may also be told to make changes in your life, such as doing exercises and losing weight. Follow these instructions at home: Medicines  Take over-the-counter and prescription medicines only as told by your doctor.  Do not take aspirin for pain if your doctor says that you may have gout. Activity  Rest your joint if your doctor tells you to.  Avoid activities that make the pain worse.  Exercise your joint regularly as told by your doctor. Try doing exercises like: ? Swimming. ? Water aerobics. ? Biking. ? Walking. Managing pain, stiffness, and swelling      If told, put ice on the affected area. ? Put ice in a plastic bag. ? Place a towel between your skin and the bag. ? Leave the ice on for 20 minutes, 2-3 times per day.  If your joint is swollen, raise (elevate) it above the level of your heart if told by your doctor.  If your joint feels stiff in the morning,  try taking a warm shower.  If told, put heat on the affected area. Do this as often as told by your doctor. Use the heat source that your doctor recommends, such as a moist heat pack or a heating pad. If you have diabetes, do not apply heat without asking your doctor. To apply heat: ? Place a towel between your skin and the heat source. ? Leave the heat on for 20-30 minutes. ? Remove the heat if your skin turns bright red. This is very important if you are unable to feel pain, heat, or cold. You may have a greater risk of getting burned. General instructions  Do not use any products that contain nicotine or tobacco, such as cigarettes, e-cigarettes, and chewing tobacco. If you need help quitting, ask your doctor.  Keep all follow-up visits as told by your doctor. This is important. Contact a doctor if:  The pain gets worse.  You have a fever. Get help right away if:  You have very bad pain in your joint.  You have swelling in your joint.  Your joint is red.  Many joints become painful and swollen.  You have very bad back pain.  Your leg is very weak.  You cannot control your pee (urine) or poop (stool). Summary  Arthritis means joint pain. It can also mean joint disease. A joint is a place  where bones come together.  The most common cause of this condition is wear and tear of a joint.  Symptoms of this condition include redness, swelling, or stiffness of the joint.  This condition is treated with rest, raising the joint, medicines, and putting cold or hot packs on the joint.  Follow your doctor's instructions about medicines, activity, exercises, and other home care treatments. This information is not intended to replace advice given to you by your health care provider. Make sure you discuss any questions you have with your health care provider. Document Released: 05/11/2009 Document Revised: 01/22/2018 Document Reviewed: 01/22/2018 Elsevier Patient Education  2020  Elsevier [employer]   Preventive Care 49-34 Years Old, Male Preventive care refers to lifestyle choices and visits with your health care provider that can promote health and wellness. This includes:  A yearly physical exam. This is also called an annual well check.  Regular dental and eye exams.  Immunizations.  Screening for certain conditions.  Healthy lifestyle choices, such as eating a healthy diet, getting regular exercise, not using drugs or products that contain nicotine and tobacco, and limiting alcohol use. What can I expect for my preventive care visit? Physical exam Your health care provider will check:  Height and weight. These may be used to calculate body mass index (BMI), which is a measurement that tells if you are at a healthy weight.  Heart rate and blood pressure.  Your skin for abnormal spots. Counseling Your health care provider may ask you questions about:  Alcohol, tobacco, and drug use.  Emotional well-being.  Home and relationship well-being.  Sexual activity.  Eating habits.  Work and work Statistician. What immunizations do I need?  Influenza (flu) vaccine  This is recommended every year. Tetanus, diphtheria, and pertussis (Tdap) vaccine  You may need a Td booster every 10 years. Varicella (chickenpox) vaccine  You may need this vaccine if you have not already been vaccinated. Zoster (shingles) vaccine  You may need this after age 8. Measles, mumps, and rubella (MMR) vaccine  You may need at least one dose of MMR if you were born in 1957 or later. You may also need a second dose. Pneumococcal conjugate (PCV13) vaccine  You may need this if you have certain conditions and were not previously vaccinated. Pneumococcal polysaccharide (PPSV23) vaccine  You may need one or two doses if you smoke cigarettes or if you have certain conditions. Meningococcal conjugate (MenACWY) vaccine  You may need this if you have certain conditions.  Hepatitis A vaccine  You may need this if you have certain conditions or if you travel or work in places where you may be exposed to hepatitis A. Hepatitis B vaccine  You may need this if you have certain conditions or if you travel or work in places where you may be exposed to hepatitis B. Haemophilus influenzae type b (Hib) vaccine  You may need this if you have certain risk factors. Human papillomavirus (HPV) vaccine  If recommended by your health care provider, you may need three doses over 6 months. You may receive vaccines as individual doses or as more than one vaccine together in one shot (combination vaccines). Talk with your health care provider about the risks and benefits of combination vaccines. What tests do I need? Blood tests  Lipid and cholesterol levels. These may be checked every 5 years, or more frequently if you are over 52 years old.  Hepatitis C test.  Hepatitis B test. Screening  Lung cancer screening.  You may have this screening every year starting at age 30 if you have a 30-pack-year history of smoking and currently smoke or have quit within the past 15 years.  Prostate cancer screening. Recommendations will vary depending on your family history and other risks.  Colorectal cancer screening. All adults should have this screening starting at age 26 and continuing until age 35. Your health care provider may recommend screening at age 21 if you are at increased risk. You will have tests every 1-10 years, depending on your results and the type of screening test.  Diabetes screening. This is done by checking your blood sugar (glucose) after you have not eaten for a while (fasting). You may have this done every 1-3 years.  Sexually transmitted disease (STD) testing. Follow these instructions at home: Eating and drinking  Eat a diet that includes fresh fruits and vegetables, whole grains, lean protein, and low-fat dairy products.  Take vitamin and mineral  supplements as recommended by your health care provider.  Do not drink alcohol if your health care provider tells you not to drink.  If you drink alcohol: ? Limit how much you have to 0-2 drinks a day. ? Be aware of how much alcohol is in your drink. In the [place] one drink equals one 12 oz bottle of beer (355 mL), one 5 oz glass of wine (148 mL), or one 1 oz glass of hard liquor (44 mL). Lifestyle  Take daily care of your teeth and gums.  Stay active. Exercise for at least 30 minutes on 5 or more days each week.  Do not use any products that contain nicotine or tobacco, such as cigarettes, e-cigarettes, and chewing tobacco. If you need help quitting, ask your health care provider.  If you are sexually active, practice safe sex. Use a condom or other form of protection to prevent STIs (sexually transmitted infections).  Talk with your health care provider about taking a low-dose aspirin every day starting at age 70. What's next?  Go to your health care provider once a year for a well check visit.  Ask your health care provider how often you should have your eyes and teeth checked.  Stay up to date on all vaccines. This information is not intended to replace advice given to you by your health care provider. Make sure you discuss any questions you have with your health care provider. Document Released: 03/13/2015 Document Revised: 02/08/2018 Document Reviewed: 02/08/2018 Elsevier Patient Education  2020 [employer].

## 2019-01-03 NOTE — Progress Notes (Signed)
Assessment & Plan:  1. Well adult exam - Preventive care education provided. TDAP given today. PSA not covered by insurance. Declined HIV screening. - CMP14+EGFR - Lipid Panel  2. Chronic pain of right knee - Requesting records from [facility] for [facility] imaging. Encouraged to apply Voltaren gel QID PRN. Education provided on arthritis. Tylenol/Ibuprofen for pain PRN.   3. Stage 2 moderate COPD by GOLD classification ([place]) - Managed by pulmonologist.   4. Alpha-1-antitrypsin deficiency ([place]) - Managed by pulmonologist.  5. Chronic sinusitis, unspecified location - Patient saw ENT at the beginning of the year; at that time it was recommended he have some sort of sinus surgery. Patient does not wish to go through with surgery at this time.   6. Schizoaffective disorder, bipolar type ([place]) - Managed by psychiatrist.  7. GAD (generalized anxiety disorder) - Managed by psychiatrist.   8. S/P ascending aortic replacement - Managed by cardiologist.   9. Hyperlipidemia with target low density lipoprotein (LDL) cholesterol less than 100 mg/dL - Managed by cardiologist.   10. Tobacco use - Not interested in smoking cessation.    Follow-up: Return in about 1 year (around 01/03/2020) for annual physical.   Ian Limes, MSN, APRN, FNP-C [facility]  Subjective:  Patient ID: Ian Grimes [place], male    DOB: 1970/07/22  Age: 48 y.o. MRN: [id number]  Patient Care Team: Loman Brooklyn, FNP as PCP - General (Family Medicine) Josue Hector, MD as Consulting Physician (Cardiology) Brand Males, MD as Consulting Physician (Pulmonary Disease) Pucilowski, Marchia Bond, MD as Consulting Physician (Psychiatry)   CC:  Chief Complaint  Patient presents with  . Medical Management of Chronic Issues    HPI Ian Grimes presents for a follow-up of chronic medical conditions.   PSA: never Refills needed today: no Immunizations: Flu Vaccine: UTD  Tdap  Vaccine: interested in getting today if affordable   DEPRESSION SCREENING PHQ 2/9 Scores 01/03/2019 06/03/2016 12/02/2015 10/19/2015 08/25/2015 11/20/2014 03/12/2014  PHQ - 2 Score '2 4 6 6 6 6 6  ' PHQ- 9 Score '5 16 24 23 24 20 21    ' Dr. Johnsie Cancel manages patients hyperlipidemia. Patient has had an aortic aneurysm repaired as well.  He prescribes the atorvastatin and metoprolol.  Dr. Chase Caller manages patients COPD/alpha-1-antitrypsin deficiency. He prescribes the Anoro and Prolastin.   Dr. Montel Culver managed patients anxiety, depression, and schizo-affective psychosis. He prescribes Geodon, Zoloft, and Clonazepam.   Knee Pain: Patient presents with knee pain involving the  right knee. Onset of the symptoms was 6 months ago. Inciting event: none known. Current symptoms include giving out and pain located medial aspect. Pain is aggravated by walking.  Patient has had no prior knee problems. Evaluation to date: plain films: unavailable at this time - requested from [facility]. Treatment to date: prescription NSAIDS which are not very effective and muscle rubs.   Review of Systems  Constitutional: Negative for chills, fever, malaise/fatigue and weight loss.  HENT: Negative for congestion, ear discharge, ear pain, nosebleeds, sinus pain, sore throat and tinnitus.   Eyes: Positive for blurred vision (doesn't wear his glasses). Negative for double vision, pain, discharge and redness.  Respiratory: Negative for cough, shortness of breath and wheezing.   Cardiovascular: Negative for chest pain, palpitations and leg swelling.  Gastrointestinal: Negative for abdominal pain, constipation, diarrhea, heartburn, nausea and vomiting.  Genitourinary: Negative for dysuria, frequency and urgency.       Denies trouble initiating a urine stream, weak stream, split  stream, and dribbling.  Musculoskeletal: Positive for joint pain. Negative for myalgias.  Skin: Negative for rash.  Neurological: Negative for dizziness,  seizures, weakness and headaches.  Psychiatric/Behavioral: Negative for depression, substance abuse and suicidal ideas. The patient is not nervous/anxious.      Current Outpatient Medications:  .  acetaminophen (TYLENOL) 500 MG tablet, Take 500 mg every 6 (six) hours as needed by mouth (Pt takes 2-4 tabs prn.)., Disp: , Rfl:  .  aspirin EC 81 MG tablet, Take 81 mg by mouth daily., Disp: , Rfl:  .  atorvastatin (LIPITOR) 80 MG tablet, Take 80 mg by mouth daily., Disp: , Rfl:  .  clonazePAM (KLONOPIN) 1 MG tablet, Take 1 tablet (1 mg total) by mouth 3 (three) times daily as needed for anxiety., Disp: 90 tablet, Rfl: 2 .  metoprolol tartrate (LOPRESSOR) 25 MG tablet, Take 0.5 tablets (12.5 mg total) by mouth 2 (two) times daily. (Patient taking differently: Take 25 mg by mouth 2 (two) times daily. ), Disp: 60 tablet, Rfl: 4 .  PROLASTIN-C 1000 MG/20ML SOLN, , Disp: , Rfl:  .  [START ON 01/07/2019] sertraline (ZOLOFT) 100 MG tablet, TAKE 1.5 TABLETS BY MOUTH DAILY, Disp: 90 tablet, Rfl: 0 .  umeclidinium-vilanterol (ANORO ELLIPTA) 62.5-25 MCG/INH AEPB, Inhale 1 puff into the lungs daily., Disp: 60 each, Rfl: 5 .  ziprasidone (GEODON) 20 MG capsule, Take 1 capsule daily in the morning, Disp: 90 capsule, Rfl: 0 .  ziprasidone (GEODON) 40 MG capsule, Take one capsule by mouth every evening, Disp: 90 capsule, Rfl: 0  Allergies  Allergen Reactions  . Bactrim Rash  . Bee Venom Other (See Comments)    " had a lot of bee stings at once"  Only hand swelling at site of stings    Past Medical History:  Diagnosis Date  . Anxiety   . Aortic aneurysm (H[facility])    repaired  . Depression   . HA (headache)   . Hx of echocardiogram    Echo (10/15):  Mild LVH, EF 60-65%, no RWMA, trivial AI, mild LAE  . Hyperlipidemia   . Pleural effusion   . Pulmonary emboli ([place])   . Pulmonary nodule    follow up CT 6 months (due 9/15)  . Schizo-affective psychosis ([place])     Past Surgical History:  Procedure  Laterality Date  . ASCENDING AORTIC ROOT REPLACEMENT N/A 11/14/2013   Procedure: Value sparing Root replacement, ASCENDING AORTIC ROOT REPLACEMENT, circ Arrest;  Surgeon: Gaye Pollack, MD;  Location: [facility];  Service: Open Heart Surgery;  Laterality: N/A;  . INTRAOPERATIVE TRANSESOPHAGEAL ECHOCARDIOGRAM N/A 11/14/2013   Procedure: INTRAOPERATIVE TRANSESOPHAGEAL ECHOCARDIOGRAM;  Surgeon: Gaye Pollack, MD;  Location: [facility];  Service: Open Heart Surgery;  Laterality: N/A;  . LEFT AND RIGHT HEART CATHETERIZATION WITH CORONARY ANGIOGRAM N/A 11/01/2013   Procedure: LEFT AND RIGHT HEART CATHETERIZATION WITH CORONARY ANGIOGRAM;  Surgeon: Josue Hector, MD;  Location: [facility] C[facility] LAB;  Service: Cardiovascular;  Laterality: N/A;    Family History  Problem Relation Age of Onset  . Heart disease Father   . Stroke Father   . Heart attack Father   . COPD Father   . Alcohol abuse Father   . Lung cancer Father   . Diabetes Paternal Grandfather   . COPD Paternal 87   . Asthma Paternal Aunt   . Stroke Paternal Uncle   . Heart attack Brother 53    Social History   Socioeconomic History  . Marital status:  Legally Separated    Spouse name: Not on file  . Number of children: 0  . Years of education: Not on file  . Highest education level: GED or equivalent  Occupational History  . Occupation: none  Social Needs  . Financial resource strain: Not hard at all  . Food insecurity    Worry: Sometimes true    Inability: Sometimes true  . Transportation needs    Medical: No    Non-medical: No  Tobacco Use  . Smoking status: Current Every Day Smoker    Packs/day: 1.00    Years: 22.00    Pack years: 22.00    Types: Cigarettes  . Smokeless tobacco: Never Used  Substance and Sexual Activity  . Alcohol use: No    Alcohol/week: 0.0 standard drinks    Comment: drinks 1 can of beer/month.  06-24-15 per pt no  . Drug use: No    Comment: 06-24-15 per pt no  . Sexual activity: Not Currently  Lifestyle  .  Physical activity    Days per week: 5 days    Minutes per session: 60 min  . Stress: Very much  Relationships  . Social Herbalist on phone: Never    Gets together: Never    Attends religious service: Never    Active member of club or organization: No    Attends meetings of clubs or organizations: Never    Relationship status: Separated  . Intimate partner violence    Fear of current or ex partner: Not on file    Emotionally abused: Not on file    Physically abused: Not on file    Forced sexual activity: Not on file  Other Topics Concern  . Not on file  Social History Narrative  . Not on file      Objective:    BP 109/75   Pulse 68   Temp (!) 97.1 F (36.2 C) (Oral)   Ht 6' (1.829 m)   Wt 233 lb (105.7 kg)   SpO2 96%   BMI 31.60 kg/m   Wt Readings from Last 3 Encounters:  01/03/19 233 lb (105.7 kg)  12/24/18 232 lb 6.4 oz (105.4 kg)  04/25/18 225 lb (102.1 kg)    Physical Exam Vitals signs reviewed.  Constitutional:      General: He is not in acute distress.    Appearance: Normal appearance. He is obese. He is not ill-appearing, toxic-appearing or diaphoretic.  HENT:     Head: Normocephalic and atraumatic.     Right Ear: Tympanic membrane, ear canal and external ear normal. There is no impacted cerumen.     Left Ear: Tympanic membrane, ear canal and external ear normal. There is no impacted cerumen.     Nose: Nose normal. No congestion or rhinorrhea.     Mouth/Throat:     Mouth: Mucous membranes are moist.     Pharynx: Oropharynx is clear. No oropharyngeal exudate or posterior oropharyngeal erythema.  Eyes:     General: No scleral icterus.       Right eye: No discharge.        Left eye: No discharge.     Conjunctiva/sclera: Conjunctivae normal.     Pupils: Pupils are equal, round, and reactive to light.  Neck:     Musculoskeletal: Normal range of motion and neck supple. No neck rigidity or muscular tenderness.  Cardiovascular:     Rate and  Rhythm: Normal rate and regular rhythm.     Heart  sounds: Normal heart sounds. No murmur. No friction rub. No gallop.   Pulmonary:     Effort: Pulmonary effort is normal. No respiratory distress.     Breath sounds: Normal breath sounds. No stridor. No wheezing, rhonchi or rales.  Abdominal:     General: Abdomen is flat. Bowel sounds are normal. There is no distension.     Palpations: Abdomen is soft. There is no mass.     Tenderness: There is no abdominal tenderness. There is no guarding or rebound.     Hernia: No hernia is present.  Musculoskeletal: Normal range of motion.     Right knee: He exhibits normal range of motion, no swelling, no effusion, no ecchymosis, no deformity, no laceration, no erythema, normal alignment, no LCL laxity, normal patellar mobility, no bony tenderness, normal meniscus and no MCL laxity. No tenderness found.     Right lower leg: No edema.     Left lower leg: No edema.  Lymphadenopathy:     Cervical: No cervical adenopathy.  Skin:    General: Skin is warm and dry.     Capillary Refill: Capillary refill takes less than 2 seconds.  Neurological:     General: No focal deficit present.     Mental Status: He is alert and oriented to person, place, and time. Mental status is at baseline.  Psychiatric:        Mood and Affect: Mood normal.        Behavior: Behavior normal.        Thought Content: Thought content normal.        Judgment: Judgment normal.    Lab Results  Component Value Date   TSH 1.302 05/07/2013   Lab Results  Component Value Date   WBC 16.2 (H) 12/02/2015   HGB 16.2 12/02/2015   HCT 47.7 12/02/2015   MCV 96 12/02/2015   PLT 389 (H) 12/02/2015   Lab Results  Component Value Date   NA 139 12/02/2015   K 5.3 (H) 12/02/2015   CO2 25 12/02/2015   GLUCOSE 81 12/02/2015   BUN 9 12/02/2015   CREATININE 0.80 12/02/2015   BILITOT <0.2 12/02/2015   ALKPHOS 97 12/02/2015   AST 17 12/02/2015   ALT 17 12/02/2015   PROT 6.9 12/02/2015    ALBUMIN 4.7 12/02/2015   CALCIUM 10.0 12/02/2015   ANIONGAP 12 11/18/2013   GFR 73.71 10/28/2013   Lab Results  Component Value Date   CHOL 234 (H) 12/02/2015   Lab Results  Component Value Date   HDL 19 (L) 12/02/2015   Lab Results  Component Value Date   LDLCALC 148 (H) 12/02/2015   Lab Results  Component Value Date   TRIG 333 (H) 12/02/2015   Lab Results  Component Value Date   CHOLHDL 12.3 (H) 12/02/2015   Lab Results  Component Value Date   HGBA1C 5.6 11/12/2013

## 2019-01-04 LAB — CMP14+EGFR
ALT: 31 IU/L (ref 0–44)
AST: 24 IU/L (ref 0–40)
Albumin/Globulin Ratio: 2.3 — ABNORMAL HIGH (ref 1.2–2.2)
Albumin: 4.5 g/dL (ref 4.0–5.0)
Alkaline Phosphatase: 108 IU/L (ref 39–117)
BUN/Creatinine Ratio: 9 (ref 9–20)
BUN: 8 mg/dL (ref 6–24)
Bilirubin Total: <0.2 mg/dL (ref 0.0–1.2)
CO2: 24 mmol/L (ref 20–29)
Calcium: 9.5 mg/dL (ref 8.7–10.2)
Chloride: 104 mmol/L (ref 96–106)
Creatinine, Ser: 0.9 mg/dL (ref 0.76–1.27)
GFR calc Af Amer: 116 mL/min/{1.73_m2} (ref >59)
GFR calc non Af Amer: 101 mL/min/{1.73_m2} (ref >59)
Globulin, Total: 2 g/dL (ref 1.5–4.5)
Glucose: 87 mg/dL (ref 65–99)
Potassium: 4.5 mmol/L (ref 3.5–5.2)
Sodium: 144 mmol/L (ref 134–144)
Total Protein: 6.5 g/dL (ref 6.0–8.5)

## 2019-01-04 LAB — LIPID PANEL
Chol/HDL Ratio: 8.2 ratio — ABNORMAL HIGH (ref 0.0–5.0)
Cholesterol, Total: 163 mg/dL (ref 100–199)
HDL: 20 mg/dL — ABNORMAL LOW (ref >39)
LDL Chol Calc (NIH): 66 mg/dL (ref 0–99)
Triglycerides: 496 mg/dL — ABNORMAL HIGH (ref 0–149)
VLDL Cholesterol Cal: 77 mg/dL — ABNORMAL HIGH (ref 5–40)

## 2019-01-09 ENCOUNTER — Encounter: Payer: Self-pay | Admitting: Family Medicine

## 2019-01-29 NOTE — Progress Notes (Signed)
Cardiology Office Note   Date:  02/01/2019   ID:  Ian Grimes, DOB 04/05/1970, MRN [id number]  PCP:  Loman Brooklyn, FNP  Cardiologist:   [name], MD   No chief complaint on file.     History of Present Illness: Ian Grimes is a 48 y.o. male who presents for  f/u of AV disease Referred from [place] seen in our practice 02/14/18  History of bicuspid AV with aneurysm 2015 had valve sparing aortic root replacement with Dr Cyndia Bent  Normal coronary arteries on cath prior to surgery Previous history of PE now off anticoagulation. TTE post op with trivial AR and EF 60-65%  Still smoking significant COPD and Alpha 1 antitrypsin deficiency Also history of Schizophrenia/depression  Needs ENT surgery with Dr Benjamine Mola for deviated septum From cardiac perspective he is cleared Will need SBE prophylaxis   September 2020 ? Issues with MS called office and said he was never going to see a doctor again This related to issues With getting a klonopan script  Bipolar disease fairly well controlled per [facility] visit Dr Montel Culver July 2020 mostly anxiety Clonazepam increased   Echo 03/15/18 EF 55-60% AVR normal trivial AR mean gradient 4 mmHg   Needs CXR for smoking too young for CT screening  Needs to see dentist  Still not sleeping well   Past Medical History:  Diagnosis Date  . Anxiety   . Aortic aneurysm (HCC)    repaired  . Depression   . HA (headache)   . Hx of echocardiogram    Echo (10/15):  Mild LVH, EF 60-65%, no RWMA, trivial AI, mild LAE  . Hyperlipidemia   . Pleural effusion   . Pulmonary emboli ([place])   . Pulmonary nodule    follow up CT 6 months (due 9/15)  . Schizo-affective psychosis ([place])     Past Surgical History:  Procedure Laterality Date  . ASCENDING AORTIC ROOT REPLACEMENT N/A 11/14/2013   Procedure: Value sparing Root replacement, ASCENDING AORTIC ROOT REPLACEMENT, circ Arrest;  Surgeon: Gaye Pollack, MD;  Location: [facility];  Service:  Open Heart Surgery;  Laterality: N/A;  . INTRAOPERATIVE TRANSESOPHAGEAL ECHOCARDIOGRAM N/A 11/14/2013   Procedure: INTRAOPERATIVE TRANSESOPHAGEAL ECHOCARDIOGRAM;  Surgeon: Gaye Pollack, MD;  Location: [facility] OR;  Service: Open Heart Surgery;  Laterality: N/A;  . LEFT AND RIGHT HEART CATHETERIZATION WITH CORONARY ANGIOGRAM N/A 11/01/2013   Procedure: LEFT AND RIGHT HEART CATHETERIZATION WITH CORONARY ANGIOGRAM;  Surgeon: Josue Hector, MD;  Location: [facility] CATH LAB;  Service: Cardiovascular;  Laterality: N/A;     Current Outpatient Medications  Medication Sig Dispense Refill  . acetaminophen (TYLENOL) 500 MG tablet Take 500 mg every 6 (six) hours as needed by mouth (Pt takes 2-4 tabs prn.).    Marland Kitchen aspirin EC 81 MG tablet Take 81 mg by mouth daily.    Marland Kitchen atorvastatin (LIPITOR) 80 MG tablet Take 80 mg by mouth daily.    . clonazePAM (KLONOPIN) 1 MG tablet Take 1 tablet (1 mg total) by mouth 3 (three) times daily as needed for anxiety. 90 tablet 2  . metoprolol tartrate (LOPRESSOR) 25 MG tablet Take 25 mg by mouth 2 (two) times daily.    Marland Kitchen PROLASTIN-C 1000 MG/20ML SOLN     . sertraline (ZOLOFT) 100 MG tablet TAKE 1.5 TABLETS BY MOUTH DAILY 90 tablet 0  . umeclidinium-vilanterol (ANORO ELLIPTA) 62.5-25 MCG/INH AEPB Inhale 1 puff into the lungs daily. 60 each 5  . ziprasidone (  GEODON) 20 MG capsule Take 1 capsule daily in the morning 90 capsule 0  . ziprasidone (GEODON) 40 MG capsule Take one capsule by mouth every evening 90 capsule 0   No current facility-administered medications for this visit.     Allergies:   Bactrim and Bee venom    Social History:  The patient  reports that he has been smoking cigarettes. He has a 22.00 pack-year smoking history. He has never used smokeless tobacco. He reports that he does not drink alcohol or use drugs.   Family History:  The patient's family history includes Alcohol abuse in his father; Asthma in his paternal aunt; COPD in his father and paternal aunt; Diabetes  in his paternal grandfather; Heart attack in his father; Heart attack (age of onset: 73) in his brother; Heart disease in his father; Lung cancer in his father; Stroke in his father and paternal uncle.    ROS:  Please see the history of present illness.   Otherwise, review of systems are positive for none.   All other systems are reviewed and negative.    PHYSICAL EXAM: VS:  BP 128/90   Pulse 86   Ht 6' (1.829 m)   Wt 234 lb (106.1 kg)   SpO2 97%   BMI 31.74 kg/m  , BMI Body mass index is 31.74 kg/m. Anxious  Overweight white male Nicotine on breath HEENT: normal Neck supple with no adenopathy JVP normal no bruits no thyromegaly Lungs clear with no wheezing and good diaphragmatic motion Heart:  S1/S2 SEM through AVR no AR  murmur, no rub, gallop or click PMI normal Abdomen: benighn, BS positve, no tenderness, no AAA no bruit.  No HSM or HJR Distal pulses intact with no bruits No edema Neuro non-focal Skin warm and dry No muscular weakness    EKG:  NSR normal ECG 2015   Recent Labs: 01/03/2019: ALT 31; BUN 8; Creatinine, Ser 0.90; Potassium 4.5; Sodium 144    Lipid Panel    Component Value Date/Time   CHOL 163 01/03/2019 1141   CHOL 184 07/17/2012 1201   TRIG 496 (H) 01/03/2019 1141   TRIG 387 (H) 01/18/2013 1208   TRIG 315 (H) 07/17/2012 1201   HDL 20 (L) 01/03/2019 1141   HDL 26 (L) 01/18/2013 1208   HDL 32 (L) 07/17/2012 1201   CHOLHDL 8.2 (H) 01/03/2019 1141   CHOLHDL 5.6 05/06/2013 1223   VLDL 33 05/06/2013 1223   LDLCALC 66 01/03/2019 1141   LDLCALC 58 01/18/2013 1208   LDLCALC 89 07/17/2012 1201      Wt Readings from Last 3 Encounters:  02/01/19 234 lb (106.1 kg)  01/03/19 233 lb (105.7 kg)  12/24/18 232 lb 6.4 oz (105.4 kg)      Other studies Reviewed: Additional studies/ records that were reviewed today include: Notes from 2015 cath OR report [place] office notes recent notes pulmonary regarding COPD.    ASSESSMENT AND PLAN:  1.   Bicuspid AV:  Valve sparing operation 2015 normal AVR echo 03/15/18 SBE prophylaxis  2. COPD:  F/u Dr Chase Caller also with history of alpha 1 antitrypsin and previous PE no longer on anticoagulation. Discussed smoking cessation needs more current CXR ordered too young for lung cancer screening CT 3. HLD:  Continue statin labs with primary  4. Schizophrenia:  On Zoloft, Geodon and Klonopin  f/u with psychiatry  5. ENT:  Clear to have surgery for deviated septum he has put it off for now    Current medicines  are reviewed at length with the patient today.  The patient does not have concerns regarding medicines.  The following changes have been made:  no change  Labs/ tests ordered today include: CXR  No orders of the defined types were placed in this encounter.    Disposition:   FU with cardiology in a year      Signed, [name], MD  02/01/2019 10:23 AM    [address], [place], [place]  [address] Phone: [phone]; Fax: [phone]

## 2019-02-01 ENCOUNTER — Encounter: Payer: Self-pay | Admitting: Cardiovascular Disease

## 2019-02-01 ENCOUNTER — Ambulatory Visit (INDEPENDENT_AMBULATORY_CARE_PROVIDER_SITE_OTHER): Payer: Medicare Other | Admitting: Cardiovascular Disease

## 2019-02-01 ENCOUNTER — Other Ambulatory Visit: Payer: Self-pay

## 2019-02-01 VITALS — BP 128/90 | HR 86 | Ht 72.0 in | Wt 234.0 lb

## 2019-02-01 DIAGNOSIS — Q231 Congenital insufficiency of aortic valve: Secondary | ICD-10-CM | POA: Diagnosis not present

## 2019-02-01 DIAGNOSIS — F17200 Nicotine dependence, unspecified, uncomplicated: Secondary | ICD-10-CM

## 2019-02-01 DIAGNOSIS — E785 Hyperlipidemia, unspecified: Secondary | ICD-10-CM

## 2019-02-01 DIAGNOSIS — R059 Cough, unspecified: Secondary | ICD-10-CM

## 2019-02-01 DIAGNOSIS — R05 Cough: Secondary | ICD-10-CM

## 2019-02-01 NOTE — Patient Instructions (Signed)
Medication Instructions:   *If you need a refill on your cardiac medications before your next appointment, please call your pharmacy*  Lab Work:  If you have labs (blood work) drawn today and your tests are completely normal, you will receive your results only by: Marland Kitchen MyChart Message (if you have MyChart) OR . A paper copy in the mail If you have any lab test that is abnormal or we need to change your treatment, we will call you to review the results.  Testing/Procedures: A chest x-ray takes a picture of the organs and structures inside the chest, including the heart, lungs, and blood vessels. This test can show several things, including, whether the heart is enlarges; whether fluid is building up in the lungs; and whether pacemaker / defibrillator leads are still in place.  Follow-Up: At [facility], you and your health needs are our priority.  As part of our continuing mission to provide you with exceptional heart care, we have created designated Provider Care Teams.  These Care Teams include your primary Cardiologist (physician) and Advanced Practice Providers (APPs -  Physician Assistants and Nurse Practitioners) who all work together to provide you with the care you need, when you need it.  Your next appointment:   1 year(s)  The format for your next appointment:   In Person  Provider:   You may see Dr. Johnsie Cancel or one of the following Advanced Practice Providers on your designated Care Team:    Truitt Merle, NP  Cecilie Kicks, NP  Kathyrn Drown, NP

## 2019-02-04 ENCOUNTER — Telehealth: Payer: Self-pay | Admitting: Cardiovascular Disease

## 2019-02-04 DIAGNOSIS — R05 Cough: Secondary | ICD-10-CM

## 2019-02-04 DIAGNOSIS — R059 Cough, unspecified: Secondary | ICD-10-CM

## 2019-02-04 DIAGNOSIS — F17200 Nicotine dependence, unspecified, uncomplicated: Secondary | ICD-10-CM

## 2019-02-04 NOTE — Telephone Encounter (Signed)
Called patient about message. Informed patient that he can have chest xray done at [facility] and we will find out if she needs an appointment or can just walk in. Will send message to [place] office for advisement.

## 2019-02-04 NOTE — Telephone Encounter (Signed)
New Message   Patient is calling because he wants to have his chest xray done at [facility]. Please call to discuss.

## 2019-02-04 NOTE — Telephone Encounter (Signed)
I spoke with patient.He does not need an appt, he may go directly to radiology.he says he remembers where that is.

## 2019-02-08 ENCOUNTER — Encounter (HOSPITAL_COMMUNITY): Payer: Self-pay

## 2019-02-08 ENCOUNTER — Ambulatory Visit (HOSPITAL_COMMUNITY)
Admission: RE | Admit: 2019-02-08 | Discharge: 2019-02-08 | Disposition: A | Discharge status: Home / Self Care | Payer: Medicare Other | Source: Ambulatory Visit | Attending: Cardiovascular Disease | Admitting: Cardiovascular Disease

## 2019-02-08 ENCOUNTER — Other Ambulatory Visit: Payer: Self-pay

## 2019-02-08 DIAGNOSIS — R059 Cough, unspecified: Secondary | ICD-10-CM

## 2019-02-08 DIAGNOSIS — F17200 Nicotine dependence, unspecified, uncomplicated: Secondary | ICD-10-CM | POA: Diagnosis not present

## 2019-02-08 DIAGNOSIS — R05 Cough: Secondary | ICD-10-CM | POA: Diagnosis not present

## 2019-02-26 ENCOUNTER — Telehealth: Payer: Self-pay | Admitting: Family Medicine

## 2019-02-26 NOTE — Telephone Encounter (Signed)
Patient aware and verbalized understanding. °

## 2019-02-26 NOTE — Telephone Encounter (Signed)
Patient states he has taken 90 Clonazepam in the last 2 week because he did not remember taken them. Patient leaves alone he is really jumpy and anxious. Last time he took one was 2 days ago.

## 2019-02-26 NOTE — Telephone Encounter (Signed)
Patient's psychiatrist prescribes this medication but if he has taken 90 of them in the past two weeks I recommend he go to the ER.

## 2019-02-27 ENCOUNTER — Telehealth (HOSPITAL_COMMUNITY): Payer: Self-pay | Admitting: *Deleted

## 2019-02-27 ENCOUNTER — Telehealth: Payer: Self-pay | Admitting: Family Medicine

## 2019-02-27 NOTE — Telephone Encounter (Signed)
FYI Pt called leaving multiple messages stating that he needed to speak to Dr. Montel Culver directly and expected a phone call ASAP. Pt is calling about refilling his Klonopin which was refilled 02/10/19. There is a note from FNP stating that he called asking for ATivan and that he reportedly used 90 Klonopin within a 2 week time span. Pt was instructed to go to ED by PCP per note, which pt refused.

## 2019-02-27 NOTE — Telephone Encounter (Signed)
I was told yesterday that patient took 90 clonazepam in the past 2 weeks.  I am not the prescribing provider.  He has a psychiatrist.  I will not be sending in [employer].  I am not sure why he cannot get to the ER as an EMS unit will come pick him up if he dials 911.  We can certainly call for him if that is what needs to happen.

## 2019-02-27 NOTE — Telephone Encounter (Signed)
Patient got mad stating he can not pay $600 for a EMS ride to the hospital advised to call his psychiatrist patient stated he did and they would not do nothing. Advised EMS could evaluate and he would not have to pay anything if they did not take him anywhere.Told patient we would not call Ativan in and he got upset stated he didn't understand why he had a family doctor if we could not to this and hung up. FYI

## 2019-03-04 ENCOUNTER — Telehealth: Payer: Self-pay | Admitting: Cardiovascular Disease

## 2019-03-04 ENCOUNTER — Other Ambulatory Visit (HOSPITAL_COMMUNITY): Payer: Self-pay | Admitting: *Deleted

## 2019-03-04 MED ORDER — SERTRALINE HCL 100 MG PO TABS: ORAL_TABLET | ORAL | 0 refills | Status: DC

## 2019-03-04 NOTE — Telephone Encounter (Signed)
New Message      Pt is calling and is wanting his results from his Xray He says he cannot get on his mychart    Please call

## 2019-03-04 NOTE — Telephone Encounter (Signed)
Called patient with chest xray results. Per Dr. Johnsie Cancel, chest xray is normal. Patient verbalized understanding.

## 2019-03-09 ENCOUNTER — Other Ambulatory Visit (HOSPITAL_COMMUNITY): Payer: Self-pay | Admitting: Psychiatry

## 2019-03-10 ENCOUNTER — Other Ambulatory Visit (HOSPITAL_COMMUNITY): Payer: Self-pay | Admitting: Psychiatry

## 2019-03-13 ENCOUNTER — Ambulatory Visit (INDEPENDENT_AMBULATORY_CARE_PROVIDER_SITE_OTHER): Payer: Medicare Other | Admitting: Psychiatry

## 2019-03-13 ENCOUNTER — Other Ambulatory Visit: Payer: Self-pay

## 2019-03-13 DIAGNOSIS — F250 Schizoaffective disorder, bipolar type: Secondary | ICD-10-CM

## 2019-03-13 DIAGNOSIS — F411 Generalized anxiety disorder: Secondary | ICD-10-CM

## 2019-03-13 MED ORDER — CLONAZEPAM 1 MG PO TABS: 1.0000 mg | ORAL_TABLET | Freq: Three times a day (TID) | ORAL | 2 refills | Status: DC | PRN

## 2019-03-13 MED ORDER — TRAZODONE HCL 100 MG PO TABS: 100.0000 mg | ORAL_TABLET | Freq: Every evening | ORAL | 2 refills | Status: DC | PRN

## 2019-03-13 MED ORDER — ZIPRASIDONE HCL 40 MG PO CAPS: ORAL_CAPSULE | ORAL | 0 refills | Status: DC

## 2019-03-13 MED ORDER — SERTRALINE HCL 100 MG PO TABS: 150.0000 mg | ORAL_TABLET | Freq: Every day | ORAL | 2 refills | Status: DC

## 2019-03-13 NOTE — Progress Notes (Signed)
BH MD/PA/NP OP Progress Note  03/13/2019 2:10 PM Ian Grimes  MRN:  [id number] Interview was conducted by phone and I verified that I was speaking with the correct person using two identifiers. I discussed the limitations of evaluation and management by telemedicine and  the availability of in person appointments. Patient expressed understanding and agreed to proceed.  Chief Complaint: Episodic anxiety.  HPI: 49yo male with schizoaffective disorder bipolar type and GAD.No overt psychosis(denies having hallucinations, no delusions), depression under good control but still some anxiety(primarily in the middle of the day)reported. Sertraline andziprasidonedoses were increasedto 150 mg and 20mg  am/40 mg HS, respectively, and he tolerates both well.Clonazepam works better than hydroxyzine for anxiety but he would at times forget that he took one and take another. This caused him to run out early in September and then again in December. He has since made arrangement with a trusted friend that he keeps his clonazepam and gives it to him when needed.  He denies havingSI or mood fluctuations. Ian Grimes reports some recent problems with sleep which is interrupted. In the past he was on trazodone with good response. I will renew this prescription.  Visit Diagnosis:    ICD-10-CM   1. Schizoaffective disorder, bipolar type ([place])  F25.0   2. GAD (generalized anxiety disorder)  F41.1     Past Psychiatric History: Please see intake H&P.  Past Medical History:  Past Medical History:  Diagnosis Date  . Anxiety   . Aortic aneurysm (HCC)    repaired  . Depression   . HA (headache)   . Hx of echocardiogram    Echo (10/15):  Mild LVH, EF 60-65%, no RWMA, trivial AI, mild LAE  . Hyperlipidemia   . Pleural effusion   . Pulmonary emboli ([place])   . Pulmonary nodule    follow up CT 6 months (due 9/15)  . Schizo-affective psychosis ([place])     Past Surgical History:  Procedure Laterality Date  .  ASCENDING AORTIC ROOT REPLACEMENT N/A 11/14/2013   Procedure: Value sparing Root replacement, ASCENDING AORTIC ROOT REPLACEMENT, circ Arrest;  Surgeon: Gaye Pollack, MD;  Location: [facility];  Service: Open Heart Surgery;  Laterality: N/A;  . INTRAOPERATIVE TRANSESOPHAGEAL ECHOCARDIOGRAM N/A 11/14/2013   Procedure: INTRAOPERATIVE TRANSESOPHAGEAL ECHOCARDIOGRAM;  Surgeon: Gaye Pollack, MD;  Location: [facility] OR;  Service: Open Heart Surgery;  Laterality: N/A;  . LEFT AND RIGHT HEART CATHETERIZATION WITH CORONARY ANGIOGRAM N/A 11/01/2013   Procedure: LEFT AND RIGHT HEART CATHETERIZATION WITH CORONARY ANGIOGRAM;  Surgeon: Josue Hector, MD;  Location: [facility] CATH LAB;  Service: Cardiovascular;  Laterality: N/A;    Family Psychiatric History: Reviewed.  Family History:  Family History  Problem Relation Age of Onset  . Heart disease Father   . Stroke Father   . Heart attack Father   . COPD Father   . Alcohol abuse Father   . Lung cancer Father   . Diabetes Paternal Grandfather   . COPD Paternal 10   . Asthma Paternal Aunt   . Stroke Paternal Uncle   . Heart attack Brother 64    Social History:  Social History   Socioeconomic History  . Marital status: Legally Separated    Spouse name: Not on file  . Number of children: 0  . Years of education: Not on file  . Highest education level: GED or equivalent  Occupational History  . Occupation: none  Tobacco Use  . Smoking status: Current Every Day Smoker    Packs/day: 1.00  Years: 22.00    Pack years: 22.00    Types: Cigarettes  . Smokeless tobacco: Never Used  Substance and Sexual Activity  . Alcohol use: No    Alcohol/week: 0.0 standard drinks    Comment: drinks 1 can of beer/month.  06-24-15 per pt no  . Drug use: No    Comment: 06-24-15 per pt no  . Sexual activity: Not Currently  Other Topics Concern  . Not on file  Social History Narrative  . Not on file   Social Determinants of Health   Financial Resource Strain: Low Risk    . Difficulty of Paying Living Expenses: Not hard at all  Food Insecurity: Food Insecurity Present  . Worried About Charity fundraiser in the Last Year: Sometimes true  . Ran Out of Food in the Last Year: Sometimes true  Transportation Needs: No Transportation Needs  . Lack of Transportation (Medical): No  . Lack of Transportation (Non-Medical): No  Physical Activity: Sufficiently Active  . Days of Exercise per Week: 5 days  . Minutes of Exercise per Session: 60 min  Stress: Stress Concern Present  . Feeling of Stress : Very much  Social Connections: Severely Isolated  . Frequency of Communication with Friends and Family: Never  . Frequency of Social Gatherings with Friends and Family: Never  . Attends Religious Services: Never  . Active Member of Clubs or Organizations: No  . Attends Archivist Meetings: Never  . Marital Status: Separated    Allergies:  Allergies  Allergen Reactions  . Bactrim Rash  . Bee Venom Other (See Comments)    " had a lot of bee stings at once"  Only hand swelling at site of stings    Metabolic Disorder Labs: Lab Results  Component Value Date   HGBA1C 5.6 11/12/2013   MPG 114 11/12/2013   No results found for: PROLACTIN Lab Results  Component Value Date   CHOL 163 01/03/2019   TRIG 496 (H) 01/03/2019   HDL 20 (L) 01/03/2019   CHOLHDL 8.2 (H) 01/03/2019   VLDL 33 05/06/2013   LDLCALC 66 01/03/2019   LDLCALC 148 (H) 12/02/2015   Lab Results  Component Value Date   TSH 1.302 05/07/2013    Therapeutic Level Labs: Lab Results  Component Value Date   LITHIUM <0.25 (L) 08/14/2008   LITHIUM <0.25 (L) 08/11/2008   Lab Results  Component Value Date   VALPROATE 14.5 (L) 06/26/2011   No components found for:  CBMZ  Current Medications: Current Outpatient Medications  Medication Sig Dispense Refill  . acetaminophen (TYLENOL) 500 MG tablet Take 500 mg every 6 (six) hours as needed by mouth (Pt takes 2-4 tabs prn.).    Marland Kitchen  aspirin EC 81 MG tablet Take 81 mg by mouth daily.    Marland Kitchen atorvastatin (LIPITOR) 80 MG tablet Take 80 mg by mouth daily.    . clonazePAM (KLONOPIN) 1 MG tablet Take 1 tablet (1 mg total) by mouth 3 (three) times daily as needed for anxiety. 90 tablet 2  . metoprolol tartrate (LOPRESSOR) 25 MG tablet Take 25 mg by mouth 2 (two) times daily.    Marland Kitchen PROLASTIN-C 1000 MG/20ML SOLN     . sertraline (ZOLOFT) 100 MG tablet Take 1.5 tablets (150 mg total) by mouth daily. TAKE 1.5 TABLETS BY MOUTH DAILY 45 tablet 2  . umeclidinium-vilanterol (ANORO ELLIPTA) 62.5-25 MCG/INH AEPB Inhale 1 puff into the lungs daily. 60 each 5  . ziprasidone (GEODON) 20 MG capsule TAKE  1 CAPSULE BY MOUTH EVERY DAY IN THE MORNING 90 capsule 0  . ziprasidone (GEODON) 40 MG capsule Take one capsule by mouth every evening 90 capsule 0   No current facility-administered medications for this visit.     Psychiatric Specialty Exam: Review of Systems  Psychiatric/Behavioral: Positive for sleep disturbance. The patient is nervous/anxious.   All other systems reviewed and are negative.   There were no vitals taken for this visit.There is no height or weight on file to calculate BMI.  General Appearance: NA  Eye Contact:  NA  Speech:  Clear and Coherent and Normal Rate  Volume:  Normal  Mood:  Anxious  Affect:  NA  Thought Process:  Goal Directed  Orientation:  Full (Time, Place, and Person)  Thought Content: Logical   Suicidal Thoughts:  No  Homicidal Thoughts:  No  Memory:  Immediate;   Fair Recent;   Fair Remote;   Good  Judgement:  Fair  Insight:  Fair  Psychomotor Activity:  NA  Concentration:  Concentration: Fair  Recall:  [place] of Knowledge: Fair  Language: Good  Akathisia:  Negative  Handed:  Right  AIMS (if indicated): not done  Assets:  Communication Skills Desire for Improvement Financial Resources/Insurance Housing  ADL's:  Intact  Cognition: WNL  Sleep:  Fair   Screenings: GAD-7     Office  Visit from 12/02/2015 in [place] Visit from 10/19/2015 in [place]  Total GAD-7 Score  21  21    Mini-Mental     Clinical Support from 11/20/2014 in [place]  Total Score (max 30 points )  30    PHQ2-9     Office Visit from 01/03/2019 in [place] Visit from 06/03/2016 in [facility] Office Visit from 12/02/2015 in [place] Office Visit from 10/19/2015 in [place] Office Visit from 08/25/2015 in [place]  PHQ-2 Total Score  2  4  6  6  6   PHQ-9 Total Score  5  16  24  23  24        Assessment and Plan: 49yo male with schizoaffective disorder bipolar type and GAD.No overt psychosis(denies having hallucinations, no delusions), depression under good control but still some anxiety(primarily in the middle of the day)reported. Sertraline andziprasidonedoses were increasedto 150 mg and 20mg  am/40 mg HS, respectively, and he tolerates both well.Clonazepam works better than hydroxyzine for anxiety but he would at times forget that he took one and take another. This caused him to run out early in September and then again in December. He has since made arrangement with a trusted friend that he keeps his clonazepam and gives it to him when needed.  He denies havingSI or mood fluctuations. Ian Grimes reports some recent problems with sleep which is interrupted. In the past he was on trazodone with good response. I will renew this prescription.  Dx: Schizoaffective disorder bipolar type; GAD  Plan: We willcontinueGeodon 20 mg in AM and 40 mg in HS, sertraline 150mg daily and clonazepam to 1 mgtid prn anxiety/agitation.I will add trazodone 100 mg prn insomnia. Patient will return for next follow up visit in29months.The plan was discussed with patient who had an opportunity to ask questions  and these were all answered. I spend25 minutes inphone consultation with the patient.    Stephanie Acre, MD 03/13/2019, 2:10 PM

## 2019-04-02 ENCOUNTER — Other Ambulatory Visit (HOSPITAL_COMMUNITY): Payer: Self-pay | Admitting: Psychiatry

## 2019-04-03 ENCOUNTER — Other Ambulatory Visit (HOSPITAL_COMMUNITY): Payer: Self-pay | Admitting: Psychiatry

## 2019-04-06 ENCOUNTER — Other Ambulatory Visit (HOSPITAL_COMMUNITY): Payer: Self-pay | Admitting: Psychiatry

## 2019-04-16 ENCOUNTER — Telehealth (HOSPITAL_COMMUNITY): Payer: Self-pay | Admitting: *Deleted

## 2019-04-16 ENCOUNTER — Other Ambulatory Visit (HOSPITAL_COMMUNITY): Payer: Self-pay | Admitting: Psychiatry

## 2019-04-16 NOTE — Telephone Encounter (Signed)
FYI Pt called requesting refill of the Konopin 1mg  written on 03/13/19. Pt states that when he got the RX he counted and was #30 short. Pharmacy, [facility] in [place], is going to do an audit.

## 2019-04-16 NOTE — Telephone Encounter (Signed)
He has two refills ordered already in January so if pharmacy audits his last Rx and decides he is entitled to a refill they can give one to him (we do not need  add refills at this time)

## 2019-05-31 ENCOUNTER — Other Ambulatory Visit (HOSPITAL_COMMUNITY): Payer: Self-pay | Admitting: Psychiatry

## 2019-06-06 ENCOUNTER — Telehealth: Payer: Self-pay | Admitting: Internal Medicine

## 2019-06-06 NOTE — Telephone Encounter (Signed)
I checked Dr. Golden Pop box, and did not find fax on Patient. Called Tiara, [facility] and requested fax be resent. Tiara stated she would send fax this afternoon.  Message routed to [place], [place] to follow up

## 2019-06-07 ENCOUNTER — Telehealth: Payer: Self-pay | Admitting: Internal Medicine

## 2019-06-10 ENCOUNTER — Telehealth: Payer: Self-pay | Admitting: Cardiovascular Disease

## 2019-06-10 ENCOUNTER — Telehealth: Payer: Self-pay | Admitting: Internal Medicine

## 2019-06-10 NOTE — Telephone Encounter (Signed)
New message   Patient states that he is unhappy with his service and will not be coming back to the practice.

## 2019-06-10 NOTE — Telephone Encounter (Signed)
I called pt to see why he wanted to discontinue the Prolastin. His nurse wasn't being very flexible with her schedule. He doesn't want them to send him anymore medication because he just doesn't want to take it anymore.   He also states he is not coming back to our office because he is not wanting to take anymore medications from doctors. I deleted future recalls from his chart. FYI MR

## 2019-06-10 NOTE — Telephone Encounter (Signed)
Ok to [employer]  He sometiemes will feel sad and cancel MD appot  - just do a recall for 6 months because he might change his mind and he needs our support

## 2019-06-10 NOTE — Telephone Encounter (Signed)
Called and spoke with pt letting him know that MR said it was fine for him to discontinue the prolastin.  I stated to him that MR said he would like [place] to have a recall placed for 6 months out in [place] he decided to change his mind to be seen for an appt and pt stated that he DID NOT WANT to have any reminders/recalls placed as he WOULD NOT be scheduling any more appointments.  Pt stated that he would like to have a do not call placed on his file as he will no longer be coming to the office for any more appts.  Stated to pt that we would get that taken care of for him and he verbalized understanding. Nothing further needed.

## 2019-06-11 ENCOUNTER — Other Ambulatory Visit: Payer: Self-pay

## 2019-06-11 ENCOUNTER — Ambulatory Visit (INDEPENDENT_AMBULATORY_CARE_PROVIDER_SITE_OTHER): Payer: Medicare HMO | Admitting: Psychiatry

## 2019-06-11 DIAGNOSIS — F250 Schizoaffective disorder, bipolar type: Secondary | ICD-10-CM | POA: Diagnosis not present

## 2019-06-11 DIAGNOSIS — F411 Generalized anxiety disorder: Secondary | ICD-10-CM

## 2019-06-11 MED ORDER — ZIPRASIDONE HCL 20 MG PO CAPS: 20.0000 mg | ORAL_CAPSULE | Freq: Every day | ORAL | 0 refills | Status: DC

## 2019-06-11 MED ORDER — SERTRALINE HCL 100 MG PO TABS: 150.0000 mg | ORAL_TABLET | Freq: Every day | ORAL | 2 refills | Status: DC

## 2019-06-11 MED ORDER — ZIPRASIDONE HCL 40 MG PO CAPS: ORAL_CAPSULE | ORAL | 0 refills | Status: DC

## 2019-06-11 MED ORDER — TRAZODONE HCL 100 MG PO TABS: 200.0000 mg | ORAL_TABLET | Freq: Every evening | ORAL | 0 refills | Status: DC | PRN

## 2019-06-11 MED ORDER — CLONAZEPAM 1 MG PO TABS: 1.0000 mg | ORAL_TABLET | Freq: Three times a day (TID) | ORAL | 2 refills | Status: DC | PRN

## 2019-06-11 NOTE — Telephone Encounter (Signed)
Spoke with pt yesterday 4/12 and pt no longer wants to have prolastin and also stated he will no longer be scheduling any appts with our office. Pt also wishes to have no further contact with [facility]. Nothing further needed.

## 2019-06-11 NOTE — Progress Notes (Signed)
BH MD/PA/NP OP Progress Note  06/11/2019 2:13 PM Beto Fougerousse Cashen  MRN:  [id number] Interview was conducted by phone and I verified that I was speaking with the correct person using two identifiers. I discussed the limitations of evaluation and management by telemedicine and  the availability of in person appointments. Patient expressed understanding and agreed to proceed.  Chief Complaint: Some anxiety.  HPI: 49yo male with schizoaffective disorder bipolar type and GAD.No overt psychosis(denies having hallucinations, no delusions), depression under good control but still some anxiety(primarily in the middle of the day)reported. Sertraline andziprasidonedoses were increasedto 150 mg and 20mg  am/40 mg HS, respectively, and he tolerates both well.Clonazepam works better than hydroxyzine for anxiety but hewould at times forget that he took one and take another. This caused him to run out early in September and then again in December. He has since made arrangement with a trusted friend that he keeps his clonazepam and gives it to him when needed.He denies havingSI or mood fluctuations.Bienvenido reports some recent problems with sleep which is interrupted. In the past he was on trazodone with good response so I ordered 100 mg as needed at night. He ended up taking two of these and only then his sleep improved.   Visit Diagnosis:    ICD-10-CM   1. Schizoaffective disorder, bipolar type ([place])  F25.0   2. GAD (generalized anxiety disorder)  F41.1     Past Psychiatric History: Please see intake H&P.  Past Medical History:  Past Medical History:  Diagnosis Date  . Anxiety   . Aortic aneurysm (HCC)    repaired  . Depression   . HA (headache)   . Hx of echocardiogram    Echo (10/15):  Mild LVH, EF 60-65%, no RWMA, trivial AI, mild LAE  . Hyperlipidemia   . Pleural effusion   . Pulmonary emboli ([place])   . Pulmonary nodule    follow up CT 6 months (due 9/15)  . Schizo-affective  psychosis ([place])     Past Surgical History:  Procedure Laterality Date  . ASCENDING AORTIC ROOT REPLACEMENT N/A 11/14/2013   Procedure: Value sparing Root replacement, ASCENDING AORTIC ROOT REPLACEMENT, circ Arrest;  Surgeon: Gaye Pollack, MD;  Location: [place];  Service: Open Heart Surgery;  Laterality: N/A;  . INTRAOPERATIVE TRANSESOPHAGEAL ECHOCARDIOGRAM N/A 11/14/2013   Procedure: INTRAOPERATIVE TRANSESOPHAGEAL ECHOCARDIOGRAM;  Surgeon: Gaye Pollack, MD;  Location: [place] [facility];  Service: Open Heart Surgery;  Laterality: N/A;  . LEFT AND RIGHT HEART CATHETERIZATION WITH CORONARY ANGIOGRAM N/A 11/01/2013   Procedure: LEFT AND RIGHT HEART CATHETERIZATION WITH CORONARY ANGIOGRAM;  Surgeon: Josue Hector, MD;  Location: [facility] C[facility] LAB;  Service: Cardiovascular;  Laterality: N/A;    Family Psychiatric History: Reviewed.  Family History:  Family History  Problem Relation Age of Onset  . Heart disease Father   . Stroke Father   . Heart attack Father   . COPD Father   . Alcohol abuse Father   . Lung cancer Father   . Diabetes Paternal Grandfather   . COPD Paternal 10   . Asthma Paternal Aunt   . Stroke Paternal Uncle   . Heart attack Brother 26    Social History:  Social History   Socioeconomic History  . Marital status: Legally Separated    Spouse name: Not on file  . Number of children: 0  . Years of education: Not on file  . Highest education level: GED or equivalent  Occupational History  . Occupation: none  Tobacco  Use  . Smoking status: Current Every Day Smoker    Packs/day: 1.00    Years: 22.00    Pack years: 22.00    Types: Cigarettes  . Smokeless tobacco: Never Used  Substance and Sexual Activity  . Alcohol use: No    Alcohol/week: 0.0 standard drinks    Comment: drinks 1 can of beer/month.  06-24-15 per pt no  . Drug use: No    Comment: 06-24-15 per pt no  . Sexual activity: Not Currently  Other Topics Concern  . Not on file  Social History Narrative  . Not on  file   Social Determinants of Health   Financial Resource Strain:   . Difficulty of Paying Living Expenses:   Food Insecurity:   . Worried About Charity fundraiser in the Last Year:   . Arboriculturist in the Last Year:   Transportation Needs:   . Film/video editor (Medical):   Marland Kitchen Lack of Transportation (Non-Medical):   Physical Activity:   . Days of Exercise per Week:   . Minutes of Exercise per Session:   Stress:   . Feeling of Stress :   Social Connections:   . Frequency of Communication with Friends and Family:   . Frequency of Social Gatherings with Friends and Family:   . Attends Religious Services:   . Active Member of Clubs or Organizations:   . Attends Archivist Meetings:   Marland Kitchen Marital Status:     Allergies:  Allergies  Allergen Reactions  . Bactrim Rash  . Bee Venom Other (See Comments)    " had a lot of bee stings at once"  Only hand swelling at site of stings    Metabolic Disorder Labs: Lab Results  Component Value Date   HGBA1C 5.6 11/12/2013   MPG 114 11/12/2013   No results found for: PROLACTIN Lab Results  Component Value Date   CHOL 163 01/03/2019   TRIG 496 (H) 01/03/2019   HDL 20 (L) 01/03/2019   CHOLHDL 8.2 (H) 01/03/2019   VLDL 33 05/06/2013   LDLCALC 66 01/03/2019   LDLCALC 148 (H) 12/02/2015   Lab Results  Component Value Date   TSH 1.302 05/07/2013    Therapeutic Level Labs: Lab Results  Component Value Date   LITHIUM <0.25 (L) 08/14/2008   LITHIUM <0.25 (L) 08/11/2008   Lab Results  Component Value Date   VALPROATE 14.5 (L) 06/26/2011   No components found for:  CBMZ  Current Medications: Current Outpatient Medications  Medication Sig Dispense Refill  . acetaminophen (TYLENOL) 500 MG tablet Take 500 mg every 6 (six) hours as needed by mouth (Pt takes 2-4 tabs prn.).    Marland Kitchen aspirin EC 81 MG tablet Take 81 mg by mouth daily.    Marland Kitchen atorvastatin (LIPITOR) 80 MG tablet Take 80 mg by mouth daily.    . clonazePAM  (KLONOPIN) 1 MG tablet Take 1 tablet (1 mg total) by mouth 3 (three) times daily as needed for anxiety. 90 tablet 2  . metoprolol tartrate (LOPRESSOR) 25 MG tablet Take 25 mg by mouth 2 (two) times daily.    Marland Kitchen PROLASTIN-C 1000 MG/20ML SOLN     . sertraline (ZOLOFT) 100 MG tablet Take 1.5 tablets (150 mg total) by mouth daily. TAKE 1.5 TABLETS BY MOUTH DAILY 45 tablet 2  . traZODone (DESYREL) 100 MG tablet Take 2 tablets (200 mg total) by mouth at bedtime as needed for sleep. 180 tablet 0  . umeclidinium-vilanterol (  ANORO ELLIPTA) 62.5-25 MCG/INH AEPB Inhale 1 puff into the lungs daily. 60 each 5  . ziprasidone (GEODON) 20 MG capsule Take 1 capsule (20 mg total) by mouth daily with breakfast. 90 capsule 0  . ziprasidone (GEODON) 40 MG capsule Take one capsule by mouth every evening 90 capsule 0   No current facility-administered medications for this visit.     Psychiatric Specialty Exam: Review of Systems  Psychiatric/Behavioral: The patient is nervous/anxious.   All other systems reviewed and are negative.   There were no vitals taken for this visit.There is no height or weight on file to calculate BMI.  General Appearance: NA  Eye Contact:  NA  Speech:  Clear and Coherent and Normal Rate  Volume:  Normal  Mood:  Anxious  Affect:  NA  Thought Process:  Goal Directed and Linear  Orientation:  Full (Time, Place, and Person)  Thought Content: Logical   Suicidal Thoughts:  No  Homicidal Thoughts:  No  Memory:  Immediate;   Good Recent;   Good Remote;   Good  Judgement:  Good  Insight:  Fair  Psychomotor Activity:  NA  Concentration:  Concentration: Good  Recall:  Good  Fund of Knowledge: Fair  Language: Good  Akathisia:  Negative  Handed:  Right  AIMS (if indicated): not done  Assets:  Communication Skills Desire for Improvement Housing Resilience  ADL's:  Intact  Cognition: WNL  Sleep:  Good   Screenings: GAD-7     Office Visit from 12/02/2015 in [place] Visit from 10/19/2015 in [place]  Total GAD-7 Score  21  21    Mini-Mental     Clinical Support from 11/20/2014 in [place]  Total Score (max 30 points )  30    PHQ2-9     Office Visit from 01/03/2019 in [place] Office Visit from 06/03/2016 in [place] Office Visit from 12/02/2015 in [place] Office Visit from 10/19/2015 in [place] Office Visit from 08/25/2015 in [place]  PHQ-2 Total Score  2  4  6  6  6   PHQ-9 Total Score  5  16  24  23  24        Assessment and Plan: 49yo male with schizoaffective disorder bipolar type and GAD.No overt psychosis(denies having hallucinations, no delusions), depression under good control but still some anxiety(primarily in the middle of the day)reported. Sertraline andziprasidonedoses were increasedto 150 mg and 20mg  am/40 mg HS, respectively, and he tolerates both well.Clonazepam works better than hydroxyzine for anxiety but hewould at times forget that he took one and take another. This caused him to run out early in September and then again in December. He has since made arrangement with a trusted friend that he keeps his clonazepam and gives it to him when needed.He denies havingSI or mood fluctuations.Elmore reports some recent problems with sleep which is interrupted. In the past he was on trazodone with good response so I ordered 100 mg as needed at night. He ended up taking two of these and only then his sleep improved.  Dx: Schizoaffective disorder bipolar type; GAD  Plan: We willcontinueGeodon 20 mg in AM and 40 mg in HS, sertraline 150mg daily, clonazepam 1 mgtid prn anxiety/agitation and trazodone 200 mg prn insomnia. Patient will return for next follow up visit in29months.The plan was discussed with patient who had an opportunity  to ask questions and  these were all answered. I spend20 minutes inphone consultation with the patient.    Stephanie Acre, MD 06/11/2019, 2:13 PM

## 2019-06-11 NOTE — Telephone Encounter (Signed)
[facility] and spoke with Thomasene Mohair to provide the additional information needed for the PA for pt's anoro inhaler. Thomasene Mohair stated that a fax will be sent to office with results.  EOC#: [id number]

## 2019-06-12 ENCOUNTER — Telehealth: Payer: Self-pay | Admitting: Internal Medicine

## 2019-06-12 NOTE — Telephone Encounter (Signed)
Spoke with patient and he is going to come back as directed in a year which would be Dec. 2021.

## 2019-06-12 NOTE — Telephone Encounter (Signed)
Called [phone] to speak with Debbie. I advised her that Bethena Roys had called on 06/10/19 to request the same information. She stated that Bethena Roys did not document anything in his chart. I gave Jackelyn Poling a verbal to D/C Prolastin. She verbalized understanding.   Nothing further needed at time of call.

## 2019-06-23 ENCOUNTER — Other Ambulatory Visit (HOSPITAL_COMMUNITY): Payer: Self-pay | Admitting: Psychiatry

## 2019-07-11 ENCOUNTER — Other Ambulatory Visit (HOSPITAL_COMMUNITY): Payer: Self-pay | Admitting: Psychiatry

## 2019-07-11 ENCOUNTER — Telehealth (HOSPITAL_COMMUNITY): Payer: Self-pay | Admitting: *Deleted

## 2019-07-11 NOTE — Telephone Encounter (Signed)
Pt called to inform clinic that the cat knocked his Klonopin down the toilet and he did not want to retrive them. Script recently filled. FYI.

## 2019-07-12 ENCOUNTER — Other Ambulatory Visit (HOSPITAL_COMMUNITY): Payer: Self-pay | Admitting: *Deleted

## 2019-07-12 MED ORDER — CLONAZEPAM 1 MG PO TABS: 1.0000 mg | ORAL_TABLET | Freq: Three times a day (TID) | ORAL | 2 refills | Status: DC | PRN

## 2019-07-12 NOTE — Telephone Encounter (Signed)
Medication management - Telephone call with patient to verify Dr. Montel Culver approved him filling his Klonpin early and agreed to call his [facility] to verify they had the medication filled for him to pick up. Requested patient to use appropriate language when speaking with LPN as explained she could not call in and authorize an early refill of his Klonopin without the provider, Dr. Montel Culver giving the order.  Patient stated understanding.  Called to verify his medication was ready for pick up at the [facility] and called patient back to inform.  Patient to call if any other questions or problems with refill.

## 2019-07-31 ENCOUNTER — Other Ambulatory Visit: Payer: Self-pay | Admitting: Family Medicine

## 2019-07-31 MED ORDER — ATORVASTATIN CALCIUM 80 MG PO TABS: 80.0000 mg | ORAL_TABLET | Freq: Every day | ORAL | 0 refills | Status: DC

## 2019-07-31 NOTE — Telephone Encounter (Signed)
  Prescription Request  07/31/2019  What is the name of the medication or equipment? Lipitor RX   Have you contacted your pharmacy to request a refill? (if applicable) Yes  Which pharmacy would you like this sent to? [place]   Patient notified that their request is being sent to the clinical staff for review and that they should receive a response within 2 business days.  Joyce's pt.  Please call him.

## 2019-08-05 ENCOUNTER — Other Ambulatory Visit: Payer: Self-pay | Admitting: *Deleted

## 2019-08-05 MED ORDER — ATORVASTATIN CALCIUM 80 MG PO TABS: 80.0000 mg | ORAL_TABLET | Freq: Every day | ORAL | 1 refills | Status: DC

## 2019-08-05 NOTE — Telephone Encounter (Signed)
OV 01/03/19 RTC 1 yr

## 2019-08-16 ENCOUNTER — Other Ambulatory Visit (HOSPITAL_COMMUNITY): Payer: Self-pay | Admitting: *Deleted

## 2019-08-27 ENCOUNTER — Encounter: Payer: Self-pay | Admitting: Family Medicine

## 2019-08-27 ENCOUNTER — Other Ambulatory Visit: Payer: Self-pay

## 2019-08-27 ENCOUNTER — Ambulatory Visit (INDEPENDENT_AMBULATORY_CARE_PROVIDER_SITE_OTHER): Payer: Medicare Other | Admitting: Family Medicine

## 2019-08-27 VITALS — BP 114/78 | HR 97 | Temp 96.8°F | Ht 73.0 in | Wt 231.0 lb

## 2019-08-27 DIAGNOSIS — J449 Chronic obstructive pulmonary disease, unspecified: Secondary | ICD-10-CM | POA: Diagnosis not present

## 2019-08-27 DIAGNOSIS — K5900 Constipation, unspecified: Secondary | ICD-10-CM

## 2019-08-27 DIAGNOSIS — E8801 Alpha-1-antitrypsin deficiency: Secondary | ICD-10-CM | POA: Diagnosis not present

## 2019-08-27 DIAGNOSIS — K219 Gastro-esophageal reflux disease without esophagitis: Secondary | ICD-10-CM | POA: Diagnosis not present

## 2019-08-27 MED ORDER — OMEPRAZOLE 20 MG PO CPDR: 20.0000 mg | DELAYED_RELEASE_CAPSULE | Freq: Every day | ORAL | 2 refills | Status: AC

## 2019-08-27 MED ORDER — ANORO ELLIPTA 62.5-25 MCG/INH IN AEPB: 1.0000 | INHALATION_SPRAY | Freq: Every day | RESPIRATORY_TRACT | 2 refills | Status: AC

## 2019-08-27 NOTE — Patient Instructions (Signed)
Miralax 1 capful in 6-8 oz beverage of choice once daily as needed.   

## 2019-08-27 NOTE — Progress Notes (Signed)
Assessment & Plan:  1. Constipation, unspecified constipation type - Miralax 1 capful in 6-8 oz beverage of choice once daily as needed.   2. Gastroesophageal reflux disease, unspecified whether esophagitis present - omeprazole (PRILOSEC) 20 MG capsule; Take 1 capsule (20 mg total) by mouth daily.  Dispense: 30 capsule; Refill: 2  3. Alpha-1-antitrypsin deficiency ([place]) - umeclidinium-vilanterol (ANORO ELLIPTA) 62.5-25 MCG/INH AEPB; Inhale 1 puff into the lungs daily.  Dispense: 60 each; Refill: 2 - Ambulatory referral to Pulmonology  4. Stage 2 moderate COPD by GOLD classification ([place]) - umeclidinium-vilanterol (ANORO ELLIPTA) 62.5-25 MCG/INH AEPB; Inhale 1 puff into the lungs daily.  Dispense: 60 each; Refill: 2 - Ambulatory referral to Pulmonology   Return in about 4 weeks (around 09/24/2019) for GERD, COPD.  Hendricks Limes, MSN, APRN, FNP-C [facility]  Subjective:    Patient ID: Ian Grimes, male    DOB: [birth year], 49 y.o.   MRN: [id number]  Patient Care Team: Loman Brooklyn, FNP as PCP - General (Family Medicine) Josue Hector, MD as Consulting Physician (Cardiology) Brand Males, MD as Consulting Physician (Pulmonary Disease) Pucilowski, Marchia Bond, MD as Consulting Physician (Psychiatry)   Chief Complaint:  Chief Complaint  Patient presents with   Anxiety    check up of chronic medical conditions    Hyperlipidemia   Constipation    Patient states that it has been going on for 2 1/2 weeks.   Anorexia    Patient states that it has been going on x 2 1/2 weeks    HPI: Ian Grimes is a 49 y.o. male presenting on 08/27/2019 for Anxiety (check up of chronic medical conditions ), Hyperlipidemia, Constipation (Patient states that it has been going on for 2 1/2 weeks.), and Anorexia (Patient states that it has been going on x 2 1/2 weeks)  Patient mentioned constipation to the CMA that roomed him for the past 2-1/2 weeks.  He  reports to me that he was constipated for 4 days 2-1/2 weeks ago but that has resolved.  He does report he has been feeling a little run down, nauseated, and has a gaseous feeling in his epigastric area.  Patient is requesting a referral back to pulmonology.  He is completely out of his Anoro inhaler.  He was previously seeing Dr. Chase Caller and is afraid they will not let him come back because he called and canceled his last appointment.   Social history:  Relevant past medical, surgical, family and social history reviewed and updated as indicated. Interim medical history since our last visit reviewed.  Allergies and medications reviewed and updated.  DATA REVIEWED: CHART IN EPIC  ROS: Negative unless specifically indicated above in HPI.    Current Outpatient Medications:    acetaminophen (TYLENOL) 500 MG tablet, Take 500 mg every 6 (six) hours as needed by mouth (Pt takes 2-4 tabs prn.)., Disp: , Rfl:    aspirin EC 81 MG tablet, Take 81 mg by mouth daily., Disp: , Rfl:    atorvastatin (LIPITOR) 80 MG tablet, Take 1 tablet (80 mg total) by mouth daily., Disp: 90 tablet, Rfl: 1   clonazePAM (KLONOPIN) 1 MG tablet, Take 1 tablet (1 mg total) by mouth 3 (three) times daily as needed for anxiety., Disp: 90 tablet, Rfl: 2   metoprolol tartrate (LOPRESSOR) 25 MG tablet, Take 25 mg by mouth 2 (two) times daily., Disp: , Rfl:    PROLASTIN-C 1000 MG/20ML SOLN, , Disp: , Rfl:    sertraline (  ZOLOFT) 100 MG tablet, TAKE 1 AND 1/2 TABLET BY MOUTH EVERY DAY, Disp: 45 tablet, Rfl: 2   traZODone (DESYREL) 100 MG tablet, Take 2 tablets (200 mg total) by mouth at bedtime as needed for sleep., Disp: 180 tablet, Rfl: 0   umeclidinium-vilanterol (ANORO ELLIPTA) 62.5-25 MCG/INH AEPB, Inhale 1 puff into the lungs daily., Disp: 60 each, Rfl: 5   ziprasidone (GEODON) 20 MG capsule, Take 1 capsule (20 mg total) by mouth daily with breakfast., Disp: 90 capsule, Rfl: 0   ziprasidone (GEODON) 40 MG capsule,  Take one capsule by mouth every evening, Disp: 90 capsule, Rfl: 0   Allergies  Allergen Reactions   Bactrim Rash   Bee Venom Other (See Comments)    " had a lot of bee stings at once"  Only hand swelling at site of stings   Past Medical History:  Diagnosis Date   Anxiety    Aortic aneurysm (HCC)    repaired   Depression    HA (headache)    Hx of echocardiogram    Echo (10/15):  Mild LVH, EF 60-65%, no RWMA, trivial AI, mild LAE   Hyperlipidemia    Pleural effusion    Pulmonary emboli (HCC)    Pulmonary nodule    follow up CT 6 months (due 9/15)   Schizo-affective psychosis ([place])     Past Surgical History:  Procedure Laterality Date   ASCENDING AORTIC ROOT REPLACEMENT N/A 11/14/2013   Procedure: Value sparing Root replacement, ASCENDING AORTIC ROOT REPLACEMENT, circ Arrest;  Surgeon: Gaye Pollack, MD;  Location: [place];  Service: Open Heart Surgery;  Laterality: N/A;   INTRAOPERATIVE TRANSESOPHAGEAL ECHOCARDIOGRAM N/A 11/14/2013   Procedure: INTRAOPERATIVE TRANSESOPHAGEAL ECHOCARDIOGRAM;  Surgeon: Gaye Pollack, MD;  Location: [place] [facility];  Service: Open Heart Surgery;  Laterality: N/A;   LEFT AND RIGHT HEART CATHETERIZATION WITH CORONARY ANGIOGRAM N/A 11/01/2013   Procedure: LEFT AND RIGHT HEART CATHETERIZATION WITH CORONARY ANGIOGRAM;  Surgeon: Josue Hector, MD;  Location: [facility] CATH LAB;  Service: Cardiovascular;  Laterality: N/A;    Social History   Socioeconomic History   Marital status: Legally Separated    Spouse name: Not on file   Number of children: 0   Years of education: Not on file   Highest education level: GED or equivalent  Occupational History   Occupation: none  Tobacco Use   Smoking status: Current Every Day Smoker    Packs/day: 1.00    Years: 22.00    Pack years: 22.00    Types: Cigarettes   Smokeless tobacco: Never Used  Vaping Use   Vaping Use: Former  Substance and Sexual Activity   Alcohol use: No    Alcohol/week: 0.0  standard drinks    Comment: drinks 1 can of beer/month.  06-24-15 per pt no   Drug use: No    Comment: 06-24-15 per pt no   Sexual activity: Not Currently  Other Topics Concern   Not on file  Social History Narrative   Not on file   Social Determinants of Health   Financial Resource Strain:    Difficulty of Paying Living Expenses:   Food Insecurity:    Worried About Charity fundraiser in the Last Year:    Arboriculturist in the Last Year:   Transportation Needs:    Film/video editor (Medical):    Lack of Transportation (Non-Medical):   Physical Activity:    Days of Exercise per Week:    Minutes of Exercise  per Session:   Stress:    Feeling of Stress :   Social Connections:    Frequency of Communication with Friends and Family:    Frequency of Social Gatherings with Friends and Family:    Attends Religious Services:    Active Member of Clubs or Organizations:    Attends Archivist Meetings:    Marital Status:   Intimate Partner Violence:    Fear of Current or Ex-Partner:    Emotionally Abused:    Physically Abused:    Sexually Abused:         Objective:    BP 114/78    Pulse 97    Temp (!) 96.8 F (36 C) (Temporal)    Ht 6\' 1"  (1.854 m)    Wt 231 lb (104.8 kg)    SpO2 94%    BMI 30.48 kg/m   Wt Readings from Last 3 Encounters:  09/01/19 239 lb (108.4 kg)  08/27/19 231 lb (104.8 kg)  02/01/19 234 lb (106.1 kg)    Physical Exam Vitals reviewed.  Constitutional:      General: He is not in acute distress.    Appearance: Normal appearance. He is obese. He is not ill-appearing, toxic-appearing or diaphoretic.  HENT:     Head: Normocephalic and atraumatic.  Eyes:     General: No scleral icterus.       Right eye: No discharge.        Left eye: No discharge.     Conjunctiva/sclera: Conjunctivae normal.  Cardiovascular:     Rate and Rhythm: Normal rate and regular rhythm.     Heart sounds: Normal heart sounds. No murmur heard.   No friction rub. No gallop.   Pulmonary:     Effort: Pulmonary effort is normal. No respiratory distress.     Breath sounds: Normal breath sounds. No stridor. No wheezing, rhonchi or rales.  Musculoskeletal:        General: Normal range of motion.     Cervical back: Normal range of motion.  Skin:    General: Skin is warm and dry.  Neurological:     Mental Status: He is alert and oriented to person, place, and time. Mental status is at baseline.  Psychiatric:        Mood and Affect: Mood normal.        Behavior: Behavior normal.        Thought Content: Thought content normal.        Judgment: Judgment normal.     Lab Results  Component Value Date   TSH 1.302 05/07/2013   Lab Results  Component Value Date   WBC 16.2 (H) 12/02/2015   HGB 16.2 12/02/2015   HCT 47.7 12/02/2015   MCV 96 12/02/2015   PLT 389 (H) 12/02/2015   Lab Results  Component Value Date   NA 144 01/03/2019   K 4.5 01/03/2019   CO2 24 01/03/2019   GLUCOSE 87 01/03/2019   BUN 8 01/03/2019   CREATININE 0.90 01/03/2019   BILITOT <0.2 01/03/2019   ALKPHOS 108 01/03/2019   AST 24 01/03/2019   ALT 31 01/03/2019   PROT 6.5 01/03/2019   ALBUMIN 4.5 01/03/2019   CALCIUM 9.5 01/03/2019   ANIONGAP 12 11/18/2013   GFR 73.71 10/28/2013   Lab Results  Component Value Date   CHOL 163 01/03/2019   Lab Results  Component Value Date   HDL 20 (L) 01/03/2019   Lab Results  Component Value Date   LDLCALC 66  01/03/2019   Lab Results  Component Value Date   TRIG 496 (H) 01/03/2019   Lab Results  Component Value Date   CHOLHDL 8.2 (H) 01/03/2019   Lab Results  Component Value Date   HGBA1C 5.6 11/12/2013

## 2019-08-29 ENCOUNTER — Other Ambulatory Visit (HOSPITAL_COMMUNITY): Payer: Self-pay | Admitting: Psychiatry

## 2019-08-31 DIAGNOSIS — M19041 Primary osteoarthritis, right hand: Secondary | ICD-10-CM | POA: Diagnosis not present

## 2019-09-01 ENCOUNTER — Emergency Department (HOSPITAL_COMMUNITY)
Admission: EM | Admit: 2019-09-01 | Discharge: 2019-09-01 | Disposition: A | Discharge status: Home / Self Care | Payer: Medicare Other | Attending: Emergency Medicine | Admitting: Emergency Medicine

## 2019-09-01 ENCOUNTER — Encounter (HOSPITAL_COMMUNITY): Payer: Self-pay

## 2019-09-01 ENCOUNTER — Other Ambulatory Visit: Payer: Self-pay

## 2019-09-01 DIAGNOSIS — G5631 Lesion of radial nerve, right upper limb: Secondary | ICD-10-CM | POA: Diagnosis not present

## 2019-09-01 DIAGNOSIS — R609 Edema, unspecified: Secondary | ICD-10-CM | POA: Diagnosis not present

## 2019-09-01 DIAGNOSIS — R0902 Hypoxemia: Secondary | ICD-10-CM | POA: Diagnosis not present

## 2019-09-01 DIAGNOSIS — F17210 Nicotine dependence, cigarettes, uncomplicated: Secondary | ICD-10-CM | POA: Insufficient documentation

## 2019-09-01 DIAGNOSIS — G8321 Monoplegia of upper limb affecting right dominant side: Secondary | ICD-10-CM | POA: Diagnosis not present

## 2019-09-01 DIAGNOSIS — Z7982 Long term (current) use of aspirin: Secondary | ICD-10-CM | POA: Diagnosis not present

## 2019-09-01 DIAGNOSIS — Z79899 Other long term (current) drug therapy: Secondary | ICD-10-CM | POA: Diagnosis not present

## 2019-09-01 DIAGNOSIS — R531 Weakness: Secondary | ICD-10-CM | POA: Diagnosis present

## 2019-09-01 DIAGNOSIS — G8320 Monoplegia of upper limb affecting unspecified side: Secondary | ICD-10-CM | POA: Diagnosis not present

## 2019-09-01 NOTE — ED Triage Notes (Signed)
Pt brought in by EMS due to numbness in hand up to mid forearm  Since friday. Pt reports that he had been on sofa and got up to go to kitchen and noticed numbness. Pt went to urgent care yesterday and refused to come to ED. Pt noticed yesterday that right hand was swollen and unable to extend hand at wrist. No pain

## 2019-09-01 NOTE — ED Provider Notes (Signed)
[facility] EMERGENCY DEPARTMENT Provider Note   CSN: [id number] Arrival date & time: 09/01/19  1226     History Chief Complaint  Patient presents with   Numbness    Ian Grimes is a 49 y.o. male with a past medical history of schizoaffective psychosis, alpha-1 antitrypsin deficiency, aortic aneurysm status post repair in 2015 by Dr. Caffie Pinto, who presents today for evaluation of weakness in the right arm and hand since Friday evening.  He reports that he got up off the couch and noticed that his right wrist and hand were weak.  He states that he did not have any significant alcohol intake or recreational drug use.  He went to urgent care and was told to come to the ER however he declined time.  He states that his weakness has been unchanged.  He does report paresthesias on the dorsum of the right hand.  He does not have a history of similar.  Denies any trauma.  No headache or neck pain.  Denies any vision changes, weakness in the left arm, or bilateral legs.  He denies any chest pain, back pain, shortness of breath.  Does not have any pain in the arm or wrist.  HPI     Past Medical History:  Diagnosis Date   Anxiety    Aortic aneurysm ([place])    repaired   Depression    HA (headache)    Hx of echocardiogram    Echo (10/15):  Mild LVH, EF 60-65%, no RWMA, trivial AI, mild LAE   Hyperlipidemia    Pleural effusion    Pulmonary emboli (HCC)    Pulmonary nodule    follow up CT 6 months (due 9/15)   Schizo-affective psychosis ([place])     Patient Active Problem List   Diagnosis Date Noted   GAD (generalized anxiety disorder) 03/28/2018   Sinobronchitis 12/02/2015   History of pulmonary embolism 06/01/2015   Alpha-1-antitrypsin deficiency ([place]) 06/01/2015   Stage 2 moderate COPD by GOLD classification ([place]) 11/19/2014   Snoring 02/03/2014   S/P ascending aortic replacement 11/19/2013   Congenital bicuspid aortic valve 11/07/2013   Tobacco use 05/06/2013    Hyperlipidemia with target low density lipoprotein (LDL) cholesterol less than 100 mg/dL 01/18/2013   Schizoaffective disorder, bipolar type ([place]) 07/17/2012   Atypical migraine 11/28/2011    Past Surgical History:  Procedure Laterality Date   ASCENDING AORTIC ROOT REPLACEMENT N/A 11/14/2013   Procedure: Value sparing Root replacement, ASCENDING AORTIC ROOT REPLACEMENT, circ Arrest;  Surgeon: Gaye Pollack, MD;  Location: [facility];  Service: Open Heart Surgery;  Laterality: N/A;   INTRAOPERATIVE TRANSESOPHAGEAL ECHOCARDIOGRAM N/A 11/14/2013   Procedure: INTRAOPERATIVE TRANSESOPHAGEAL ECHOCARDIOGRAM;  Surgeon: Gaye Pollack, MD;  Location: [facility];  Service: Open Heart Surgery;  Laterality: N/A;   LEFT AND RIGHT HEART CATHETERIZATION WITH CORONARY ANGIOGRAM N/A 11/01/2013   Procedure: LEFT AND RIGHT HEART CATHETERIZATION WITH CORONARY ANGIOGRAM;  Surgeon: Josue Hector, MD;  Location: [facility] CATH LAB;  Service: Cardiovascular;  Laterality: N/A;       Family History  Problem Relation Age of Onset   Heart disease Father    Stroke Father    Heart attack Father    COPD Father    Alcohol abuse Father    Lung cancer Father    Diabetes Paternal Grandfather    COPD Paternal Aunt    Asthma Paternal Aunt    Stroke Paternal Uncle    Heart attack Brother 53    Social History  Tobacco Use   Smoking status: Current Every Day Smoker    Packs/day: 1.00    Years: 22.00    Pack years: 22.00    Types: Cigarettes   Smokeless tobacco: Never Used  Vaping Use   Vaping Use: Former  Substance Use Topics   Alcohol use: No    Alcohol/week: 0.0 standard drinks    Comment: drinks 1 can of beer/month.  06-24-15 per pt no   Drug use: No    Comment: 06-24-15 per pt no    Home Medications Prior to Admission medications   Medication Sig Start Date End Date Taking? Authorizing Provider  acetaminophen (TYLENOL) 500 MG tablet Take 500 mg every 6 (six) hours as needed by mouth (Pt takes 2-4  tabs prn.).   Yes [provider]  aspirin EC 81 MG tablet Take 81 mg by mouth daily.   Yes [provider]  atorvastatin (LIPITOR) 80 MG tablet Take 1 tablet (80 mg total) by mouth daily. 08/05/19  Yes Hendricks Limes F, FNP  clonazePAM (KLONOPIN) 1 MG tablet TAKE 1 TABLET BY MOUTH 3 TIMES DAILY AS NEEDED FOR ANXIETY. 08/29/19  Yes Pucilowski, Olgierd A, MD  metoprolol tartrate (LOPRESSOR) 25 MG tablet Take 25 mg by mouth 2 (two) times daily.   Yes [provider]  omeprazole (PRILOSEC) 20 MG capsule Take 1 capsule (20 mg total) by mouth daily. 08/27/19  Yes Hendricks Limes F, FNP  sertraline (ZOLOFT) 100 MG tablet TAKE 1 AND 1/2 TABLET BY MOUTH EVERY DAY 06/23/19  Yes Pucilowski, Olgierd A, MD  traZODone (DESYREL) 100 MG tablet Take 2 tablets (200 mg total) by mouth at bedtime as needed for sleep. 06/11/19 09/09/19 Yes Pucilowski, Olgierd A, MD  umeclidinium-vilanterol (ANORO ELLIPTA) 62.5-25 MCG/INH AEPB Inhale 1 puff into the lungs daily. 08/27/19  Yes Hendricks Limes F, FNP  ziprasidone (GEODON) 20 MG capsule Take 1 capsule (20 mg total) by mouth daily with breakfast. 06/11/19 09/09/19 Yes Pucilowski, Olgierd A, MD  ziprasidone (GEODON) 40 MG capsule Take one capsule by mouth every evening 06/11/19  Yes Pucilowski, Marchia Bond, MD  PROLASTIN-C 1000 MG/20ML SOLN  12/26/18   [provider]    Allergies    Bactrim and Bee venom  Review of Systems   Review of Systems  Constitutional: Negative for chills and fever.  HENT: Negative for congestion.   Eyes: Negative for visual disturbance.  Respiratory: Negative for cough, chest tightness and shortness of breath.   Cardiovascular: Negative for chest pain.  Gastrointestinal: Negative for abdominal pain, diarrhea, nausea and vomiting.  Genitourinary: Negative for dysuria.  Musculoskeletal: Negative for back pain and neck pain.  Neurological: Positive for weakness and numbness. Negative for dizziness, syncope, facial  asymmetry and headaches.  All other systems reviewed and are negative.   Physical Exam Updated Vital Signs BP 125/77    Pulse (!) 53    Temp 98.1 F (36.7 C) (Oral)    Resp 16    Ht 6' (1.829 m)    Wt 108.4 kg    SpO2 98%    BMI 32.41 kg/m   Physical Exam Vitals and nursing note reviewed.  Constitutional:      General: He is not in acute distress.    Appearance: He is well-developed. He is not diaphoretic.  HENT:     Head: Normocephalic and atraumatic.  Eyes:     General: No scleral icterus.       Right eye: No discharge.  Left eye: No discharge.     Extraocular Movements: Extraocular movements intact.     Conjunctiva/sclera: Conjunctivae normal.     Pupils: Pupils are equal, round, and reactive to light.  Cardiovascular:     Rate and Rhythm: Normal rate and regular rhythm.     Pulses:          Radial pulses are 2+ on the right side and 2+ on the left side.  Pulmonary:     Effort: Pulmonary effort is normal. No respiratory distress.     Breath sounds: No stridor.  Abdominal:     General: There is no distension.  Musculoskeletal:        General: No deformity.     Cervical back: Normal range of motion.  Skin:    General: Skin is warm and dry.  Neurological:     Mental Status: He is alert and oriented to person, place, and time.     GCS: GCS eye subscore is 4. GCS verbal subscore is 5. GCS motor subscore is 6.     Motor: No abnormal muscle tone.     Comments: Mental Status:  Alert, oriented, thought content appropriate, able to give a coherent history. Speech fluent without evidence of aphasia. Able to follow 2 step commands without difficulty.  Cranial Nerves:  II:  Peripheral visual fields grossly normal, pupils equal, round, reactive to light III,IV, VI: ptosis not present, extra-ocular motions intact bilaterally  V,VII: smile symmetric, facial light touch sensation equal VIII: hearing grossly normal to voice  X: uvula elevates symmetrically  XI: bilateral  shoulder shrug symmetric and strong XII: midline tongue extension without fassiculations Motor: 1/5 strength RUE in wrist extension, finger extension.  5/5 strength in right triceps/biceps strength, right wrist flexion, right finger flexion.   5/5 strength in bilateral lower extremities.  There is intact strength in left arm. Gait: normal gait and balance CV: distal pulses palpable throughout  Subjective decreased sensation to light touch isolated over the dorsum of the left hand, does not extend down fingers.    Psychiatric:        Behavior: Behavior normal.     ED Results / Procedures / Treatments   Labs (all labs ordered are listed, but only abnormal results are displayed) Labs Reviewed - No data to display  EKG None  Radiology No results found.  Procedures Procedures (including critical care time)  Medications Ordered in ED Medications - No data to display  ED Course  I have reviewed the triage vital signs and the nursing notes.  Pertinent labs & imaging results that were available during my care of the patient were reviewed by me and considered in my medical decision making (see chart for details).  Clinical Course as of Sep 01 37  Sun Sep 01, 2019  1612 49 yo male presenting to ED with right wrist drop and hand numbness beginning 2 days ago.  The patient reports missing on his couch with his arms extended behind his head for period of time.  When he stood up to go to the kitchen he said it felt like his hand had fallen asleep.  Specifically describes paresthesias on the dorsum of his right hand mostly involving the thumb and second finger.  He also describes weakness with wrist extension.  He denies headache.  He denies numbness or weakness and or else in his body.  He has an overall benign physical exam.  His exam is only notable only for a wrist drop with weakness  to wrist flexion, and paresthesias specifically in a radial nerve distribution of the right hand.  Median  and ulnar distributions intact, no difficult with thumb and 5th finger opposition, can make OK sign.  This exam is extremely consistent with peripheral nerve palsy, likely Saturday night palsy.  We'll place him in a wrist spllint and have him f/u with PCP in 2 weeks.  I advised it can take 4-6 weeks to make a full recovery from this in some cases.   [MT]    Clinical Course User Index [MT] Trifan, Carola Rhine, MD   MDM Rules/Calculators/A&P                          Patient is a 49 year old man who presents today for evaluation of weakness in the right arm for approximately 2 days.  On exam he has subjective decreased sensation to light touch over the dorsum of the right hand with 1/5 strength right wrist and finger extension, intact right triceps/biceps strength, right wrist flexion and flexion of fingers on right hand.  He does not have any pain.  2+ radial pulse in the right hand with brisk capillary refill.    Clinically symptoms can appear consistent with radial neuropathy at the spiral groove consistent with a "Saturday night palsy"    He does have a history of aortic repair, however has intact pulses, is not having any chest or back pain or discomfort, shortness of breath and no pain in the arm.  If he did have a dissection or aortic cause of his symptoms I would have suspected that it would have progressed over the past 2 days and that he would have pain associated. Based on his deficit being attributable to a peripheral nerve pattern with a near textbook physical exam for radial nerve palsy I do not suspect a central cause for his symptoms.  This patient was seen as a shared visit with Dr. Langston Masker.  Patient given wrist brace.  Recommended outpatient follow up.   Return precautions were discussed with patient who states their understanding.  At the time of discharge patient denied any unaddressed complaints or concerns.  Patient is agreeable for discharge home.  Note: Portions of this report  may have been transcribed using voice recognition software. Every effort was made to ensure accuracy; however, inadvertent computerized transcription errors may be present    Final Clinical Impression(s) / ED Diagnoses Final diagnoses:  Saturday night paralysis of right upper extremity  Radial nerve palsy, right    Rx / DC Orders ED Discharge Orders    None       Lorin Glass, PA-C 09/02/19 0040    Wyvonnia Dusky, MD 09/02/19 1339

## 2019-09-01 NOTE — Discharge Instructions (Signed)
Your condition can take up to 4-6 weeks to make a full recovery.  Follow up with your doctor's office in 2 weeks for a recheck of your strength.  I suspect you have a radial nerve palsy from the way you positioned your arm while sitting on the couch 2 days ago.  I did not think this presentation was consistent with a stroke.

## 2019-09-02 ENCOUNTER — Encounter: Payer: Self-pay | Admitting: Family Medicine

## 2019-09-10 ENCOUNTER — Other Ambulatory Visit: Payer: Self-pay

## 2019-09-10 ENCOUNTER — Telehealth (INDEPENDENT_AMBULATORY_CARE_PROVIDER_SITE_OTHER): Payer: Medicare Other | Admitting: Psychiatry

## 2019-09-10 DIAGNOSIS — F411 Generalized anxiety disorder: Secondary | ICD-10-CM | POA: Diagnosis not present

## 2019-09-10 DIAGNOSIS — F250 Schizoaffective disorder, bipolar type: Secondary | ICD-10-CM | POA: Diagnosis not present

## 2019-09-10 MED ORDER — CLONAZEPAM 1 MG PO TABS: ORAL_TABLET | ORAL | 0 refills | Status: DC

## 2019-09-10 MED ORDER — ZIPRASIDONE HCL 40 MG PO CAPS: ORAL_CAPSULE | ORAL | 0 refills | Status: DC

## 2019-09-10 MED ORDER — TRAZODONE HCL 100 MG PO TABS: 200.0000 mg | ORAL_TABLET | Freq: Every evening | ORAL | 0 refills | Status: DC | PRN

## 2019-09-10 MED ORDER — ZIPRASIDONE HCL 20 MG PO CAPS: 20.0000 mg | ORAL_CAPSULE | Freq: Every day | ORAL | 0 refills | Status: DC

## 2019-09-10 MED ORDER — SERTRALINE HCL 100 MG PO TABS: 150.0000 mg | ORAL_TABLET | Freq: Every day | ORAL | 2 refills | Status: DC

## 2019-09-10 NOTE — Progress Notes (Signed)
BH MD/PA/NP OP Progress Note  09/10/2019 2:08 PM Ian Grimes  MRN:  [id number] Interview was conducted by phone and I verified that I was speaking with the correct person using two identifiers. I discussed the limitations of evaluation and management by telemedicine and  the availability of in person appointments. Patient expressed understanding and agreed to proceed. Patient location - home; physician - home office.  Chief Complaint: "I am doing well".  HPI: 49yo male with schizoaffective disorder bipolar type and GAD.No overt psychosis(denies having hallucinations, no delusions), depression under good control but still some anxiety(primarily in the middle of the day)reported. Sertraline andziprasidonedoses were increasedto 150 mg and 20mg  am/40 mg HS, respectively, and he tolerates both well.Clonazepam works better than hydroxyzine for anxiety but hewould at times forget that he took one and take another. This caused him to run out earlyin September and then again in December. He has since made arrangement with a trusted friend that he keeps his clonazepam and gives it to him when needed.He denies havingSI or mood fluctuations.Ian Grimes reported some recent problems with sleep which is interrupted. In the past he was on trazodone with good response so I ordered 100 mg as needed at night. He ended up taking two of these and only then his sleep improved.  He does not use it every night.  Visit Diagnosis:    ICD-10-CM   1. Schizoaffective disorder, bipolar type ([place])  F25.0   2. GAD (generalized anxiety disorder)  F41.1     Past Psychiatric History: Please see intake H&P.  Past Medical History:  Past Medical History:  Diagnosis Date  . Anxiety   . Aortic aneurysm ([facility])    repaired  . Depression   . HA (headache)   . Hx of echocardiogram    Echo (10/15):  Mild LVH, EF 60-65%, no RWMA, trivial AI, mild LAE  . Hyperlipidemia   . Pleural effusion   . Pulmonary emboli ([place])    . Pulmonary nodule    follow up CT 6 months (due 9/15)  . Schizo-affective psychosis ([place])     Past Surgical History:  Procedure Laterality Date  . ASCENDING AORTIC ROOT REPLACEMENT N/A 11/14/2013   Procedure: Value sparing Root replacement, ASCENDING AORTIC ROOT REPLACEMENT, circ Arrest;  Surgeon: Gaye Pollack, MD;  Location: [facility];  Service: Open Heart Surgery;  Laterality: N/A;  . INTRAOPERATIVE TRANSESOPHAGEAL ECHOCARDIOGRAM N/A 11/14/2013   Procedure: INTRAOPERATIVE TRANSESOPHAGEAL ECHOCARDIOGRAM;  Surgeon: Gaye Pollack, MD;  Location: [facility];  Service: Open Heart Surgery;  Laterality: N/A;  . LEFT AND RIGHT HEART CATHETERIZATION WITH CORONARY ANGIOGRAM N/A 11/01/2013   Procedure: LEFT AND RIGHT HEART CATHETERIZATION WITH CORONARY ANGIOGRAM;  Surgeon: Josue Hector, MD;  Location: [facility] CATH LAB;  Service: Cardiovascular;  Laterality: N/A;    Family Psychiatric History: Reviewed.  Family History:  Family History  Problem Relation Age of Onset  . Heart disease Father   . Stroke Father   . Heart attack Father   . COPD Father   . Alcohol abuse Father   . Lung cancer Father   . Diabetes Paternal Grandfather   . COPD Paternal 76   . Asthma Paternal Aunt   . Stroke Paternal Uncle   . Heart attack Brother 62    Social History:  Social History   Socioeconomic History  . Marital status: Legally Separated    Spouse name: Not on file  . Number of children: 0  . Years of education: Not on file  .  Highest education level: GED or equivalent  Occupational History  . Occupation: none  Tobacco Use  . Smoking status: Current Every Day Smoker    Packs/day: 1.00    Years: 22.00    Pack years: 22.00    Types: Cigarettes  . Smokeless tobacco: Never Used  Vaping Use  . Vaping Use: Former  Substance and Sexual Activity  . Alcohol use: No    Alcohol/week: 0.0 standard drinks    Comment: drinks 1 can of beer/month.  06-24-15 per pt no  . Drug use: No    Comment: 06-24-15 per pt no   . Sexual activity: Not Currently  Other Topics Concern  . Not on file  Social History Narrative  . Not on file   Social Determinants of Health   Financial Resource Strain:   . Difficulty of Paying Living Expenses:   Food Insecurity:   . Worried About Charity fundraiser in the Last Year:   . Arboriculturist in the Last Year:   Transportation Needs:   . Film/video editor (Medical):   Marland Kitchen Lack of Transportation (Non-Medical):   Physical Activity:   . Days of Exercise per Week:   . Minutes of Exercise per Session:   Stress:   . Feeling of Stress :   Social Connections:   . Frequency of Communication with Friends and Family:   . Frequency of Social Gatherings with Friends and Family:   . Attends Religious Services:   . Active Member of Clubs or Organizations:   . Attends Archivist Meetings:   Marland Kitchen Marital Status:     Allergies:  Allergies  Allergen Reactions  . Bactrim Rash  . Bee Venom Other (See Comments)    " had a lot of bee stings at once"  Only hand swelling at site of stings    Metabolic Disorder Labs: Lab Results  Component Value Date   HGBA1C 5.6 11/12/2013   MPG 114 11/12/2013   No results found for: PROLACTIN Lab Results  Component Value Date   CHOL 163 01/03/2019   TRIG 496 (H) 01/03/2019   HDL 20 (L) 01/03/2019   CHOLHDL 8.2 (H) 01/03/2019   VLDL 33 05/06/2013   LDLCALC 66 01/03/2019   LDLCALC 148 (H) 12/02/2015   Lab Results  Component Value Date   TSH 1.302 05/07/2013    Therapeutic Level Labs: Lab Results  Component Value Date   LITHIUM <0.25 (L) 08/14/2008   LITHIUM <0.25 (L) 08/11/2008   Lab Results  Component Value Date   VALPROATE 14.5 (L) 06/26/2011   No components found for:  CBMZ  Current Medications: Current Outpatient Medications  Medication Sig Dispense Refill  . acetaminophen (TYLENOL) 500 MG tablet Take 500 mg every 6 (six) hours as needed by mouth (Pt takes 2-4 tabs prn.).    Marland Kitchen aspirin EC 81 MG tablet  Take 81 mg by mouth daily.    Marland Kitchen atorvastatin (LIPITOR) 80 MG tablet Take 1 tablet (80 mg total) by mouth daily. 90 tablet 1  . [START ON 09/28/2019] clonazePAM (KLONOPIN) 1 MG tablet TAKE 1 TABLET BY MOUTH 3 TIMES DAILY AS NEEDED FOR ANXIETY. 90 tablet 0  . metoprolol tartrate (LOPRESSOR) 25 MG tablet Take 25 mg by mouth 2 (two) times daily.    Marland Kitchen omeprazole (PRILOSEC) 20 MG capsule Take 1 capsule (20 mg total) by mouth daily. 30 capsule 2  . PROLASTIN-C 1000 MG/20ML SOLN  (Patient not taking: Reported on 09/01/2019)    .  sertraline (ZOLOFT) 100 MG tablet Take 1.5 tablets (150 mg total) by mouth daily. 45 tablet 2  . traZODone (DESYREL) 100 MG tablet Take 2 tablets (200 mg total) by mouth at bedtime as needed for sleep. 180 tablet 0  . umeclidinium-vilanterol (ANORO ELLIPTA) 62.5-25 MCG/INH AEPB Inhale 1 puff into the lungs daily. 60 each 2  . ziprasidone (GEODON) 20 MG capsule Take 1 capsule (20 mg total) by mouth daily with breakfast. 90 capsule 0  . ziprasidone (GEODON) 40 MG capsule Take one capsule by mouth every evening 90 capsule 0   No current facility-administered medications for this visit.    Psychiatric Specialty Exam: Review of Systems  Psychiatric/Behavioral: The patient is nervous/anxious.   All other systems reviewed and are negative.   There were no vitals taken for this visit.There is no height or weight on file to calculate BMI.  General Appearance: NA  Eye Contact:  NA  Speech:  Clear and Coherent and Normal Rate  Volume:  Normal  Mood:  Some anxiety  Affect:  NA  Thought Process:  Goal Directed  Orientation:  Full (Time, Place, and Person)  Thought Content: Logical   Suicidal Thoughts:  No  Homicidal Thoughts:  No  Memory:  Immediate;   Good Recent;   Good Remote;   NA  Judgement:  Fair  Insight:  Fair  Psychomotor Activity:  NA  Concentration:  Concentration: Good  Recall:  Good  Fund of Knowledge: Good  Language: Good  Akathisia:  Negative  Handed:  Right   AIMS (if indicated): not done  Assets:  Communication Skills Desire for Improvement Financial Resources/Insurance Housing Resilience  ADL's:  Intact  Cognition: WNL  Sleep:  Fair   Screenings: GAD-7     Office Visit from 08/27/2019 in [place] Office Visit from 12/02/2015 in [place] Visit from 10/19/2015 in [place]  Total GAD-7 Score 20 21 21     Mini-Mental     Clinical Support from 11/20/2014 in [place]  Total Score (max 30 points ) 30    PHQ2-9     Office Visit from 08/27/2019 in [place] Office Visit from 01/03/2019 in [place] Office Visit from 06/03/2016 in [place] Office Visit from 12/02/2015 in [place] Office Visit from 10/19/2015 in [place]  PHQ-2 Total Score 6 2 4 6 6   PHQ-9 Total Score 20 5 16 24 23        Assessment and Plan:  48yo male with schizoaffective disorder bipolar type and GAD.No overt psychosis(denies having hallucinations, no delusions), depression under good control but still some anxiety(primarily in the middle of the day)reported. Sertraline andziprasidonedoses were increasedto 150 mg and 20mg  am/40 mg HS, respectively, and he tolerates both well.Clonazepam works better than hydroxyzine for anxiety but hewould at times forget that he took one and take another. This caused him to run out earlyin September and then again in December. He has since made arrangement with a trusted friend that he keeps his clonazepam and gives it to him when needed.He denies havingSI or mood fluctuations.Ian Grimes reported some recent problems with sleep which is interrupted. In the past he was on trazodone with good response so I ordered 100 mg as needed at night. He ended up taking two of these and only then his sleep improved.  He does  not use it every night.  Dx: Schizoaffective disorder bipolar type;  GAD  Plan: We willcontinueGeodon 20 mg in AM and 40 mg in HS,sertraline 150mg daily, clonazepam 1 mgtid prn anxiety/agitation and trazodone 200 mg prn insomnia.Patient will return for next follow up visit in31months.The plan was discussed with patient who had an opportunity to ask questions and these were all answered. I spend15 minutes inphone consultation with the patient.    Stephanie Acre, MD 09/10/2019, 2:08 PM

## 2019-09-17 ENCOUNTER — Ambulatory Visit (INDEPENDENT_AMBULATORY_CARE_PROVIDER_SITE_OTHER): Payer: Medicare Other | Admitting: Primary Care

## 2019-09-17 ENCOUNTER — Other Ambulatory Visit: Payer: Self-pay

## 2019-09-17 ENCOUNTER — Encounter: Payer: Self-pay | Admitting: Primary Care

## 2019-09-17 ENCOUNTER — Ambulatory Visit (INDEPENDENT_AMBULATORY_CARE_PROVIDER_SITE_OTHER): Payer: Medicare Other

## 2019-09-17 VITALS — BP 110/70 | HR 74 | Temp 97.3°F | Ht 72.0 in | Wt 237.6 lb

## 2019-09-17 DIAGNOSIS — J449 Chronic obstructive pulmonary disease, unspecified: Secondary | ICD-10-CM

## 2019-09-17 DIAGNOSIS — F17200 Nicotine dependence, unspecified, uncomplicated: Secondary | ICD-10-CM | POA: Diagnosis not present

## 2019-09-17 DIAGNOSIS — Z23 Encounter for immunization: Secondary | ICD-10-CM | POA: Diagnosis not present

## 2019-09-17 DIAGNOSIS — Z720 Tobacco use: Secondary | ICD-10-CM

## 2019-09-17 DIAGNOSIS — E8801 Alpha-1-antitrypsin deficiency: Secondary | ICD-10-CM

## 2019-09-17 NOTE — Assessment & Plan Note (Signed)
-  Patient self stopped Prolastin and home infusions in May 2021 due to scheduling conflicts -Plan check repeat alpha-1 level today -We will touch base with pharmacist as we will likely need to resume Prolastin infusions

## 2019-09-17 NOTE — Patient Instructions (Addendum)
Recommendation: - We will try and get you set up to resume Prolastin infusions  - Continue Anoro one puff daily; albuterol as needed every 6 hours for breakthrough shortness of breath/wheezing   - Taper down the amount you are smoking and start thinking about picking a target quit date that would work for you   Office treatment: - Needs prevnar vaccine   Orders: - Labs/check alpha-1 level  - CXR re: COPD   Follow-up: - 6 months with Dr. Chase Caller    Steps to Quit Smoking Smoking tobacco is the leading cause of preventable death. It can affect almost every organ in the body. Smoking puts you and people around you at risk for many serious, long-lasting (chronic) diseases. Quitting smoking can be hard, but it is one of the best things that you can do for your health. It is never too late to quit. How do I get ready to quit? When you decide to quit smoking, make a plan to help you succeed. Before you quit:  Pick a date to quit. Set a date within the next 2 weeks to give you time to prepare.  Write down the reasons why you are quitting. Keep this list in places where you will see it often.  Tell your family, friends, and co-workers that you are quitting. Their support is important.  Talk with your doctor about the choices that may help you quit.  Find out if your health insurance will pay for these treatments.  Know the people, places, things, and activities that make you want to smoke (triggers). Avoid them. What first steps can I take to quit smoking?  Throw away all cigarettes at home, at work, and in your car.  Throw away the things that you use when you smoke, such as ashtrays and lighters.  Clean your car. Make sure to empty the ashtray.  Clean your home, including curtains and carpets. What can I do to help me quit smoking? Talk with your doctor about taking medicines and seeing a counselor at the same time. You are more likely to succeed when you do both.  If you are  pregnant or breastfeeding, talk with your doctor about counseling or other ways to quit smoking. Do not take medicine to help you quit smoking unless your doctor tells you to do so. To quit smoking: Quit right away  Quit smoking totally, instead of slowly cutting back on how much you smoke over a period of time.  Go to counseling. You are more likely to quit if you go to counseling sessions regularly. Take medicine You may take medicines to help you quit. Some medicines need a prescription, and some you can buy over-the-counter. Some medicines may contain a drug called nicotine to replace the nicotine in cigarettes. Medicines may:  Help you to stop having the desire to smoke (cravings).  Help to stop the problems that come when you stop smoking (withdrawal symptoms). Your doctor may ask you to use:  Nicotine patches, gum, or lozenges.  Nicotine inhalers or sprays.  Non-nicotine medicine that is taken by mouth. Find resources Find resources and other ways to help you quit smoking and remain smoke-free after you quit. These resources are most helpful when you use them often. They include:  Online chats with a Social worker.  Phone quitlines.  Printed Furniture conservator/restorer.  Support groups or group counseling.  Text messaging programs.  Mobile phone apps. Use apps on your mobile phone or tablet that can help you stick to  your quit plan. There are many free apps for mobile phones and tablets as well as websites. Examples include Quit Guide from the [employer] and smokefree.gov  What things can I do to make it easier to quit?   Talk to your family and friends. Ask them to support and encourage you.  Call a phone quitline (1-800-QUIT-NOW), reach out to support groups, or work with a Social worker.  Ask people who smoke to not smoke around you.  Avoid places that make you want to smoke, such as: ? Bars. ? Parties. ? Smoke-break areas at work.  Spend time with people who do not  smoke.  Lower the stress in your life. Stress can make you want to smoke. Try these things to help your stress: ? Getting regular exercise. ? Doing deep-breathing exercises. ? Doing yoga. ? Meditating. ? Doing a body scan. To do this, close your eyes, focus on one area of your body at a time from head to toe. Notice which parts of your body are tense. Try to relax the muscles in those areas. How will I feel when I quit smoking? Day 1 to 3 weeks Within the first 24 hours, you may start to have some problems that come from quitting tobacco. These problems are very bad 2-3 days after you quit, but they do not often last for more than 2-3 weeks. You may get these symptoms:  Mood swings.  Feeling restless, nervous, angry, or annoyed.  Trouble concentrating.  Dizziness.  Strong desire for high-sugar foods and nicotine.  Weight gain.  Trouble pooping (constipation).  Feeling like you may vomit (nausea).  Coughing or a sore throat.  Changes in how the medicines that you take for other issues work in your body.  Depression.  Trouble sleeping (insomnia). Week 3 and afterward After the first 2-3 weeks of quitting, you may start to notice more positive results, such as:  Better sense of smell and taste.  Less coughing and sore throat.  Slower heart rate.  Lower blood pressure.  Clearer skin.  Better breathing.  Fewer sick days. Quitting smoking can be hard. Do not give up if you fail the first time. Some people need to try a few times before they succeed. Do your best to stick to your quit plan, and talk with your doctor if you have any questions or concerns. Summary  Smoking tobacco is the leading cause of preventable death. Quitting smoking can be hard, but it is one of the best things that you can do for your health.  When you decide to quit smoking, make a plan to help you succeed.  Quit smoking right away, not slowly over a period of time.  When you start  quitting, seek help from your doctor, family, or friends. This information is not intended to replace advice given to you by your health care provider. Make sure you discuss any questions you have with your health care provider. Document Revised: 11/09/2018 Document Reviewed: 05/05/2018 Elsevier Patient Education  [place] Antitrypsin Deficiency, Adult  Alpha-1 antitrypsin (AAT) is a protein that helps the lungs and other organs work properly. It is made by the liver. AAT deficiency is a genetic condition that happens when the liver produces too little AAT, no AAT, or AAT that does not work properly. AAT deficiency can cause liver disease, lung disease, and a skin condition called panniculitis (rare). Being around things like smoke and dust can make these problems worse. What are the  causes? This condition is caused by a genetic defect that is passed from parent to child (inherited). The condition typically develops only if a person inherits the defective gene from both parents. What increases the risk? You are more likely to develop this condition if:  You have a family history of the condition.  You are Caucasian and have European ancestry. What are the signs or symptoms? Symptoms of this condition include:  Shortness of breath.  Asthma that is difficult to control.  Having noisy breathing (wheeze).  Coughing.  Recurring infections in the breathing (respiratory) system.  Unexplained weight loss.  Rapid heartbeat.  Fatigue.  Abdominal pain.  A yellowish color of the skin or the white parts of the eyes (jaundice).  Swelling of the ankles or abdomen.  Hardened skin with painful lumps(panniculitis). How is this diagnosed? This condition is diagnosed with a blood test or with a DNA sample taken from your mouth. You may also have other tests, including:  Imaging studies of your chest, such as an X-ray or CT scan.  Lung function tests to see how  well your lungs are working.  Liver function tests to see how well your liver is working.  A liver biopsy to check for damage to your liver. How is this treated? There is no cure for AAT deficiency. However, treatments can relieve symptoms and improve quality of life. Treatment options include:  Inhaled steroids or bronchodilators. These medicines can help with breathing problems.  Pulmonary rehabilitation. This is a program that helps to improve lung function and teaches you to breathe more efficiently.  AAT augmentation therapy. This involves weekly injections to increase your level of AAT.  Prescription vitamins, including vitamins D, E, and K. Vitamins can help to maintain normal liver function.  Medicine to relieve symptoms that are related to liver problems, including jaundice, fluid retention, and severe itching.  A lung transplant. This is rarely needed. It may help someone who has a severe AAT deficiency. Follow these instructions at home: Lifestyle   Do not use any products that contain nicotine or tobacco, such as cigarettes and e-cigarettes. If you need help quitting, ask your health care provider.  Avoid secondhand smoke.  Do not drink alcohol.  Exercise regularly. Reducing breathing problems  Stay indoors when air quality is poor.  Wear a mask to keep your airways free of dust.  Talk to your health care provider about getting flu (influenza) and pneumonia shots to prevent respiratory infections.  Wash your hands often to prevent infections.  Avoid activities such as mowing the lawn or vacuuming when possible. General instructions  Take over-the-counter and prescription medicines only as told by your health care provider.  Keep all follow-up visits as told by your health care provider. This is important. Contact a health care provider if you:  Often get respiratory infections, such as bronchitis or pneumonia.  Wheeze, cough, or have other breathing  problems that are not helped with medicine.  Experience new liver-related problems, such as jaundice. Get help right away if you:  Notice that your respiratory infection does not go away completely, or it returns after treatment.  Become dizzy or weak or you pass out because your breathing problems are so bad. Summary  Alpha-1 antitrypsin is a protein that is made in the liver and helps the lungs and other organs work properly.  A deficiency of this protein can occur if a person inherits the defective gene from both parents. It is diagnosed with a blood or  DNA test.  Symptoms of the deficiency include coughing, shortness of breath, and fatigue.  The deficiency is treated with supportive measures that will help your breathing and sometimes with injections of the protein.  Call your health care provider if you have breathing problems that will not go away. This information is not intended to replace advice given to you by your health care provider. Make sure you discuss any questions you have with your health care provider. Document Revised: 06/23/2017 Document Reviewed: 12/12/2016 Elsevier Patient Education  2020 [employer].

## 2019-09-17 NOTE — Assessment & Plan Note (Addendum)
-  Increased shortness of breath off alpha-1 proteinase inhibitor. No recent exacerbations. He stopped taking LABA/LAMA for two months, re-prescribed by PCP. Denies chest tightness or cough.  -Continue Anoro 1 puff daily -FU in 6 moths with MR

## 2019-09-17 NOTE — Progress Notes (Signed)
Please let patient know CXR showed no active cardiopulmonary disease. Lungs were clear. No pneumonia or lung mass seen

## 2019-09-17 NOTE — Progress Notes (Signed)
@Patient  ID: Ian Grimes, male    DOB: Oct 19, 1970, 49 y.o.   MRN: [id number]  Chief Complaint  Patient presents with  . Follow-up    doing well    Referring provider: Loman Brooklyn, FNP  HPI: 49 year old male, current every day smoker.  Past medical history significant for moderate COPD, alpha 1 antitrypsin deficiency, migraine headache, generalized anxiety disorder, history of pulmonary embolism, hyperlipidemia, status post a ascending aortic replacement, schizoaffective disorder/bipolar type, snoring, tobacco use.  Patient of Dr. Chase Caller, last seen in office on December 24, 2018.  Maintained on Prolastin infusions at home since January 2020.  Continues Anoro and albuterol as needed.  Needs Prevnar pneumonia vaccine.  Consider chest x-ray as patient is not eligible for low-dose CT scan screening.  09/17/2019 Patient presents today for 6 month follow-up visit/ COPD.  He stopped prolastin injections in May d/t being frustrated with home health nurse. He has noticed a difference in his breath since being worse. He has increased labored breathing going out to the mailbox or climbing steps. Denies cough, wheezing or chest tightness. He is still using Anoro once daily, he was off of this for 2 months and was prescribed by PCP. He is still smoking 1ppd. He is due for Prevnar vaccine today.    Allergies  Allergen Reactions  . Bactrim Rash  . Bee Venom Other (See Comments)    " had a lot of bee stings at once"  Only hand swelling at site of stings    Immunization History  Administered Date(s) Administered  . Influenza,inj,Quad PF,6+ Mos 01/17/2017, 11/21/2017, 10/22/2018  . Pneumococcal Conjugate-13 09/17/2019  . Pneumococcal Polysaccharide-23 03/06/2018  . Tdap 09/02/2005, 01/03/2019    Past Medical History:  Diagnosis Date  . Anxiety   . Aortic aneurysm (HCC)    repaired  . Depression   . HA (headache)   . Hx of echocardiogram    Echo (10/15):  Mild LVH, EF 60-65%, no RWMA,  trivial AI, mild LAE  . Hyperlipidemia   . Pleural effusion   . Pulmonary emboli ([place])   . Pulmonary nodule    follow up CT 6 months (due 9/15)  . Schizo-affective psychosis ([place])     Tobacco History: Social History   Tobacco Use  Smoking Status Current Every Day Smoker  . Packs/day: 1.00  . Years: 22.00  . Pack years: 22.00  . Types: Cigarettes  Smokeless Tobacco Never Used   Ready to quit: No Counseling given: Yes   Outpatient Medications Prior to Visit  Medication Sig Dispense Refill  . aspirin EC 81 MG tablet Take 81 mg by mouth daily.    Marland Kitchen atorvastatin (LIPITOR) 80 MG tablet Take 1 tablet (80 mg total) by mouth daily. 90 tablet 1  . [START ON 09/28/2019] clonazePAM (KLONOPIN) 1 MG tablet TAKE 1 TABLET BY MOUTH 3 TIMES DAILY AS NEEDED FOR ANXIETY. 90 tablet 0  . metoprolol tartrate (LOPRESSOR) 25 MG tablet Take 25 mg by mouth 2 (two) times daily.    Marland Kitchen omeprazole (PRILOSEC) 20 MG capsule Take 1 capsule (20 mg total) by mouth daily. 30 capsule 2  . sertraline (ZOLOFT) 100 MG tablet Take 1.5 tablets (150 mg total) by mouth daily. 45 tablet 2  . traZODone (DESYREL) 100 MG tablet Take 2 tablets (200 mg total) by mouth at bedtime as needed for sleep. 180 tablet 0  . umeclidinium-vilanterol (ANORO ELLIPTA) 62.5-25 MCG/INH AEPB Inhale 1 puff into the lungs daily. 60 each 2  . ziprasidone (  GEODON) 20 MG capsule Take 1 capsule (20 mg total) by mouth daily with breakfast. 90 capsule 0  . ziprasidone (GEODON) 40 MG capsule Take one capsule by mouth every evening 90 capsule 0  . acetaminophen (TYLENOL) 500 MG tablet Take 500 mg every 6 (six) hours as needed by mouth (Pt takes 2-4 tabs prn.).    Marland Kitchen PROLASTIN-C 1000 MG/20ML SOLN      No facility-administered medications prior to visit.   Review of Systems  Review of Systems  Constitutional: Negative.   Respiratory: Positive for shortness of breath. Negative for cough, chest tightness and wheezing.   Cardiovascular: Negative.      Physical Exam  BP 110/70 (BP Location: Left Arm, Cuff Size: Normal)   Pulse 74   Temp (!) 97.3 F (36.3 C) (Oral)   Ht 6' (1.829 m)   Wt 237 lb 9.6 oz (107.8 kg)   SpO2 96%   BMI 32.22 kg/m  Physical Exam Constitutional:      Appearance: Normal appearance.  HENT:     Head: Normocephalic and atraumatic.     Mouth/Throat:     Mouth: Mucous membranes are moist.     Pharynx: Oropharynx is clear.  Cardiovascular:     Rate and Rhythm: Normal rate and regular rhythm.  Pulmonary:     Effort: Pulmonary effort is normal.     Breath sounds: Normal breath sounds.  Musculoskeletal:        General: Normal range of motion.  Neurological:     General: No focal deficit present.     Mental Status: He is alert and oriented to person, place, and time. Mental status is at baseline.  Psychiatric:        Mood and Affect: Mood normal.        Behavior: Behavior normal.        Thought Content: Thought content normal.        Judgment: Judgment normal.      Lab Results:  CBC    Component Value Date/Time   WBC 16.2 (H) 12/02/2015 1341   WBC 10.9 (H) 11/18/2013 0715   RBC 4.95 12/02/2015 1341   RBC 3.11 (L) 11/18/2013 0715   HGB 16.2 12/02/2015 1341   HCT 47.7 12/02/2015 1341   PLT 389 (H) 12/02/2015 1341   MCV 96 12/02/2015 1341   MCH 32.7 12/02/2015 1341   MCH 33.4 11/18/2013 0715   MCHC 34.0 12/02/2015 1341   MCHC 34.3 11/18/2013 0715   RDW 13.8 12/02/2015 1341   LYMPHSABS 3.9 (H) 12/02/2015 1341   MONOABS 0.6 10/28/2013 1141   EOSABS 0.4 12/02/2015 1341   BASOSABS 0.1 12/02/2015 1341    BMET    Component Value Date/Time   NA 144 01/03/2019 1141   K 4.5 01/03/2019 1141   CL 104 01/03/2019 1141   CO2 24 01/03/2019 1141   GLUCOSE 87 01/03/2019 1141   GLUCOSE 85 11/18/2013 0715   BUN 8 01/03/2019 1141   CREATININE 0.90 01/03/2019 1141   CREATININE 1.13 07/17/2012 1201   CALCIUM 9.5 01/03/2019 1141   GFRNONAA 101 01/03/2019 1141   GFRNONAA 80 07/17/2012 1201   GFRAA  116 01/03/2019 1141   GFRAA >89 07/17/2012 1201    BNP No results found for: BNP  ProBNP No results found for: PROBNP  Imaging: No results found.   Assessment & Plan:   Stage 2 moderate COPD by GOLD classification (HCC) -Increased shortness of breath off alpha-1 proteinase inhibitor. No recent exacerbations. He stopped taking LABA/LAMA  for two months, re-prescribed by PCP. Denies chest tightness or cough.  -Continue Anoro 1 puff daily -FU in 6 moths with MR  Alpha-1-antitrypsin deficiency [facility]) -Patient self stopped Prolastin and home infusions in May 2021 due to scheduling conflicts -Plan check repeat alpha-1 level today -We will touch base with pharmacist as we will likely need to resume Prolastin infusions     Martyn Ehrich, NP 09/17/2019

## 2019-09-20 DIAGNOSIS — G5631 Lesion of radial nerve, right upper limb: Secondary | ICD-10-CM | POA: Diagnosis not present

## 2019-09-24 ENCOUNTER — Ambulatory Visit (INDEPENDENT_AMBULATORY_CARE_PROVIDER_SITE_OTHER): Payer: Medicare Other | Admitting: Family Medicine

## 2019-09-24 ENCOUNTER — Encounter: Payer: Self-pay | Admitting: Family Medicine

## 2019-09-24 ENCOUNTER — Other Ambulatory Visit: Payer: Self-pay

## 2019-09-24 VITALS — BP 111/72 | HR 70 | Temp 98.1°F | Ht 73.0 in | Wt 241.2 lb

## 2019-09-24 DIAGNOSIS — J449 Chronic obstructive pulmonary disease, unspecified: Secondary | ICD-10-CM

## 2019-09-24 DIAGNOSIS — G5631 Lesion of radial nerve, right upper limb: Secondary | ICD-10-CM

## 2019-09-24 DIAGNOSIS — K219 Gastro-esophageal reflux disease without esophagitis: Secondary | ICD-10-CM | POA: Diagnosis not present

## 2019-09-24 DIAGNOSIS — E8801 Alpha-1-antitrypsin deficiency: Secondary | ICD-10-CM | POA: Diagnosis not present

## 2019-09-24 DIAGNOSIS — F99 Mental disorder, not otherwise specified: Secondary | ICD-10-CM
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
## 2019-09-24 NOTE — Patient Instructions (Signed)
Radial Nerve Palsy  Radial nerve palsy is the loss of function of the radial nerve in your arm or hand. The radial nerve extends from your shoulder, around the back of your upper arm, and down the outside of your lower arm. An injury to this nerve causes certain muscles and tendons in your arm and wrist not to work properly, which leads to a condition known as wrist drop. This means that you cannot extend your wrist. If you are standing with your arm stretched straight out in front of you, your wrist will bend and your hand will drop down toward the floor. An injury to the radial nerve may also result in lost feeling (sensation) in parts of your arm. What are the causes? Common causes of this condition include:  A break (fracture) of your upper or lower arm bone.  Complications from surgery.  Improper use of crutches. Crutches that are too long can put pressure on the radial nerve where it passes through the armpit (crutch palsy).  Keeping your arm in a position that places prolonged pressure on the radial nerve, such as by sleeping with arms over the back of a chair (Saturday night palsy).  Performing repetitive activities that involve rotation of your lower arm or movement of your wrist (repetitive use injury). What increases the risk? You are more likely to develop this condition if you:  Play contact sports.  Have a condition in which your body's immune system attacks your joints (rheumatoid arthritis).  Have diabetes.  Have an underactive thyroid (hypothyroidism). What are the signs or symptoms? Symptoms of this condition include:  Inability to extend your wrist.  Difficulty straightening your elbow and wrist.  Numbness or tingling in the back of your arm, forearm, or hand.  Inability to pinch.  Muscle of the injured arm looking smaller. How is this diagnosed? This condition is diagnosed with a history of the injury and a physical exam. To confirm the diagnosis, you may  also have tests, including:  Ultrasound. This test show if the nerve has an injury.  Nerve conduction studies. These tests show if the radial nerve is sending electrical signals well.  X-rays. These may be done if your health care provider suspects that you have an injury to the bones in your arm.  MRI. This may be used to determine the cause of your radial nerve palsy or to rule out other causes of your symptoms. How is this treated?     Treatment for this condition depends on the cause. It may involve:  Medicines. ? NSAIDs may be used to control pain. ? A steroid injection may be used to decrease swelling around the nerve.  Physical and occupational therapy. This may help you: ? Regain strength in your hand and wrist. ? Maintain range of motion of your wrist and elbow.  Splinting. Your health care provider may make one or more splints for you to wear during the day or at night to help with motion and positioning of your wrist.  Removing pressure on the radial nerve. This is done if the condition is caused by pressure on the nerve. Using this treatment may allow the nerve to go back to normal within a few weeks or a few months.  Surgery. Depending on the cause of your radial nerve palsy, surgery may be needed to: ? Remove pressure on the nerve (entrapment). ? Repair broken bones. ? Relocate (transfer) tendons in your lower arm to help you regain strength and mobility of  your wrist. ? Transfer a nerve to the injury site to restore nerve function. Follow these instructions at home: If you have a splint or brace  Wear the splint or brace as told by your health care provider. Remove it only as told by your health care provider.  Loosen the splint or brace if your fingers tingle, become numb, or turn cold and blue.  Keep the splint or brace clean.  If the splint or brace is not waterproof: ? Do not let it get wet. ? Cover it with a watertight covering when you take a bath or a  shower. Managing pain, stiffness, and swelling  If directed, put ice on the injured area: ? If you have a removable splint or brace, remove it as told by your health care provider. ? Put ice in a plastic bag. ? Place a towel between your skin and the bag. ? Leave the ice on for 20 minutes, 2-3 times a day.  Move your fingers often to avoid stiffness and to lessen swelling.  Raise (elevate) the injured area above the level of your heart while you are sitting or lying down. General instructions  Follow your health care provider's instructions about how to protect your hand and wrist.  Protect your hand from extreme temperature injuries, such as burns and frostbite.  Exercise your hand, wrist, and arm on a regular basis, as directed by your health care provider or therapist.  Keep all follow-up visits as told by your health care provider. This is important. Contact a health care provider if you:  Have a sudden increase in pain.  Develop new numbness or new loss of sensation in your hand. Get help right away if you:  Have a sudden change in your ability to move your arm, wrist, or hand.  Notice your fingers become bluish in color or feel cold to the touch. Summary  Radial nerve palsy is the loss of function of the radial nerve in your arm or hand. That nerve extends from your shoulder, around the back of your upper arm, and down the outside of your lower arm.  An injury to the radial nerve causes certain muscles and tendons in your arm and wrist not to work properly. This makes you unable to extend your wrist (a condition known as wrist drop). You may also lose feeling (sensation) in parts of your arm.  Treatment for this condition depends on the cause. It may include removing pressure on the radial nerve, physical and occupational therapy, splinting, medicines, or surgery. This information is not intended to replace advice given to you by your health care provider. Make sure you  discuss any questions you have with your health care provider. Document Revised: 03/09/2017 Document Reviewed: 03/09/2017 Elsevier Patient Education  2020 [employer].

## 2019-09-26 LAB — ALPHA-1 ANTITRYPSIN PHENOTYPE: A-1 Antitrypsin, Ser: 89 mg/dL (ref 83–199)

## 2019-09-27 ENCOUNTER — Encounter: Payer: Self-pay | Admitting: Family Medicine

## 2019-09-30 ENCOUNTER — Other Ambulatory Visit: Payer: Self-pay

## 2019-09-30 ENCOUNTER — Encounter: Payer: Self-pay | Admitting: Physical Therapy

## 2019-09-30 ENCOUNTER — Ambulatory Visit: Payer: Medicare Other | Attending: Family Medicine | Admitting: Physical Therapy

## 2019-09-30 DIAGNOSIS — M79601 Pain in right arm: Secondary | ICD-10-CM

## 2019-09-30 DIAGNOSIS — M6281 Muscle weakness (generalized): Secondary | ICD-10-CM

## 2019-09-30 DIAGNOSIS — G5631 Lesion of radial nerve, right upper limb: Secondary | ICD-10-CM | POA: Insufficient documentation

## 2019-09-30 NOTE — Therapy (Signed)
[facility] [place], [place], [address] Phone: [phone]   Fax:  [phone]  Physical Therapy Evaluation  Patient Details  Name: Ian Grimes MRN: [id number] Date of Birth: [birth year] Referring Provider (PT): Pollie Friar, [place]   Encounter Date: 09/30/2019   PT End of Session - 09/30/19 1953    Visit Number 1    Number of Visits 12    Date for PT Re-Evaluation 11/18/19    Authorization Type Progress note every 10th visit    PT Start Time 1300    PT Stop Time 1346    PT Time Calculation (min) 46 min    Equipment Utilized During Treatment Other (comment)   R wrist brace   Activity Tolerance Patient tolerated treatment well    Behavior During Therapy [facility] for tasks assessed/performed           Past Medical History:  Diagnosis Date  . Anxiety   . Aortic aneurysm (HCC)    repaired  . Depression   . HA (headache)   . Hx of echocardiogram    Echo (10/15):  Mild LVH, EF 60-65%, no RWMA, trivial AI, mild LAE  . Hyperlipidemia   . Pleural effusion   . Pulmonary emboli ([place])   . Pulmonary nodule    follow up CT 6 months (due 9/15)  . Schizo-affective psychosis ([place])     Past Surgical History:  Procedure Laterality Date  . ASCENDING AORTIC ROOT REPLACEMENT N/A 11/14/2013   Procedure: Value sparing Root replacement, ASCENDING AORTIC ROOT REPLACEMENT, circ Arrest;  Surgeon: Gaye Pollack, MD;  Location: [place];  Service: Open Heart Surgery;  Laterality: N/A;  . INTRAOPERATIVE TRANSESOPHAGEAL ECHOCARDIOGRAM N/A 11/14/2013   Procedure: INTRAOPERATIVE TRANSESOPHAGEAL ECHOCARDIOGRAM;  Surgeon: Gaye Pollack, MD;  Location: [place] [facility];  Service: Open Heart Surgery;  Laterality: N/A;  . LEFT AND RIGHT HEART CATHETERIZATION WITH CORONARY ANGIOGRAM N/A 11/01/2013   Procedure: LEFT AND RIGHT HEART CATHETERIZATION WITH CORONARY ANGIOGRAM;  Surgeon: Josue Hector, MD;  Location: [facility] CATH LAB;  Service: Cardiovascular;   Laterality: N/A;    There were no vitals filed for this visit.    Subjective Assessment - 09/30/19 1950    Subjective COVID-19 screening performed upon arrival.Patient arrives to physical therapy with reports of right arm numbness and loss of motion that began on August 30, 2019. Patient reports walking up in the middle of the night with R arm numbness from the elbow down to the fingers. When patient woke up in the morning the numbness and tingling intensified and he was unable to move his arm from the elbow down. Patient went to urgent care where they provided a wrist brace. Patient reports the inability to grip and use wrist and hand with normal function with limits his ability to perform ADLs such as dressing and showering and home tasks such as washing dishes. Patient reports decreased sensation to R lateral forearm to thumb, second and third fingers. Patient reports intermittent right UE pain that is sharp and shooting in nature. Patient's goals are to decrease pain, improve movement, improve strength, and use R arm normally.    Pertinent History R radial nerve palsy, July 2021, anxiety, depression    Limitations Lifting;Writing;House hold activities    Diagnostic tests none    Patient Stated Goals use right arm again    Currently in Pain? No/denies              [facility] PT Assessment - 09/30/19 0001  Assessment   Medical Diagnosis radial nerve palsy/saturday night palsy    Referring Provider (PT) Pollie Friar, FNP    Onset Date/Surgical Date 08/30/19    Hand Dominance Right    Next MD Visit n/a    Prior Therapy no      Precautions   Precautions None      Restrictions   Weight Bearing Restrictions No      Balance Screen   Has the patient fallen in the past 6 months No    How many times? 0    Has the patient had a decrease in activity level because of a fear of falling?  No    Is the patient reluctant to leave their home because of a fear of falling?  No      Home  [place] residence    Living Arrangements Alone      Prior Function   Level of [place] with basic ADLs    Vocation On disability      Sensation   Light Touch Impaired by gross assessment   decrased sensation to R thumb through middle finger     ROM / Strength   AROM / PROM / Strength AROM;Strength      AROM   Overall AROM Comments R wrist PROM WFL    AROM Assessment Site Wrist    Right/Left Wrist Right    Right Wrist Extension -40 Degrees    Right Wrist Flexion 50 Degrees    Right Wrist Radial Deviation 0 Degrees    Right Wrist Ulnar Deviation 0 Degrees      Strength   Overall Strength Deficits    Strength Assessment Site Wrist;Elbow;Hand    Right/Left Elbow Right    Right Elbow Flexion 4/5    Right Elbow Extension 4/5    Right/Left Wrist Right    Right Wrist Flexion 3-/5    Right Wrist Extension 2-/5    Right Wrist Radial Deviation 1/5    Right Wrist Ulnar Deviation 1/5    Right/Left hand Right    Right Hand Grip (lbs) 30   left 100 lbs of force                     Objective measurements completed on examination: See above findings.               PT Education - 09/30/19 1952    Education Details putty squeeze, wrist flexion, extension, ulnar and radial deviation    Person(s) Educated Patient    Methods Explanation;Demonstration;Handout    Comprehension Verbalized understanding;Returned demonstration            PT Short Term Goals - 09/30/19 1954      PT SHORT TERM GOAL #1   Title STG=LTG             PT Long Term Goals - 09/30/19 2013      PT LONG TERM GOAL #1   Title Patient will be independent with HEP    Time 6    Period Weeks    Status New      PT LONG TERM GOAL #2   Title Patient will demonstrate 60+ degrees of right wrist extension AROM to improve ability to perform functional tasks    Time 6    Period Weeks      PT LONG TERM GOAL #3   Title Patient will  demonstrate 60+ lbs of R hand grip strength to  improve ability to perform functional tasks.    Time 6    Period Weeks    Status New      PT LONG TERM GOAL #4   Title Patient will demonstrate 3+/5 or greater R wrist MMT in all planes to improve stability during functional tasks.    Time 6    Period Weeks    Status New      PT LONG TERM GOAL #5   Title Patient will report ability to perform ADLs and home tasks with right arm pain less than or equal to 3/10.    Time 6    Period Weeks    Status New                  Plan - 09/30/19 2106    Clinical Impression Statement Patient is a 49 year old right handed male who presents to physical therapy with decreased right wrist and hand ROM, decreased R wrist and hand MMT, and decreased sensation due to radial nerve palsy that began on 08/30/2019. Patient with 30 lbs of right grip strength but with significant right wrist flexion compensation. Elbow flexion and extension MMT WNL. Patient and PT discussed plan of care and discussed HEP to which patient reported understanding. Patient would benefit from skilled physical therapy to address deficits and patient's goals.    Personal Factors and Comorbidities Comorbidity 2    Examination-Activity Limitations Dressing;Hygiene/Grooming;Lift;Carry    Examination-Participation Restrictions Laundry    Stability/Clinical Decision Making Stable/Uncomplicated    Clinical Decision Making Low    Rehab Potential Good    PT Frequency 2x / week    PT Duration 6 weeks    PT Treatment/Interventions Electrical Stimulation;ADLs/Self Care Home Management;Ultrasound;Therapeutic exercise;Neuromuscular re-education;Manual techniques;Therapeutic activities;Patient/family education;Passive range of motion;Moist Heat;Functional mobility training;Cryotherapy    PT Next Visit Plan right grip strength, wrist ROM, possibly VMS to wrist extensors, modalities PRN for pain relief    PT Home Exercise Plan see patient education  section    Consulted and Agree with Plan of Care Patient           Patient will benefit from skilled therapeutic intervention in order to improve the following deficits and impairments:  Pain, Impaired UE functional use, Decreased activity tolerance, Decreased mobility, Decreased strength, Decreased range of motion  Visit Diagnosis: Pain in right arm  Muscle weakness (generalized)     Problem List Patient Active Problem List   Diagnosis Date Noted  . GAD (generalized anxiety disorder) 03/28/2018  . Sinobronchitis 12/02/2015  . History of pulmonary embolism 06/01/2015  . Alpha-1-antitrypsin deficiency ([place]) 06/01/2015  . Stage 2 moderate COPD by GOLD classification ([place]) 11/19/2014  . Snoring 02/03/2014  . S/P ascending aortic replacement 11/19/2013  . Congenital bicuspid aortic valve 11/07/2013  . Tobacco use 05/06/2013  . Hyperlipidemia with target low density lipoprotein (LDL) cholesterol less than 100 mg/dL 01/18/2013  . Schizoaffective disorder, bipolar type ([place]) 07/17/2012  . Atypical migraine 11/28/2011    Gabriela Eves, PT, DPT 09/30/2019, 9:23 PM  [facility] [address] [place], [place], [address] Phone: [phone]   Fax:  [phone]  Name: Ian Grimes MRN: [id number] Date of Birth: [birth year]

## 2019-10-02 ENCOUNTER — Other Ambulatory Visit: Payer: Self-pay

## 2019-10-02 ENCOUNTER — Telehealth: Payer: Self-pay | Admitting: Primary Care

## 2019-10-02 ENCOUNTER — Ambulatory Visit: Payer: Medicare Other | Admitting: Physical Therapy

## 2019-10-02 DIAGNOSIS — G5631 Lesion of radial nerve, right upper limb: Secondary | ICD-10-CM | POA: Diagnosis not present

## 2019-10-02 DIAGNOSIS — M79601 Pain in right arm: Secondary | ICD-10-CM

## 2019-10-02 DIAGNOSIS — M6281 Muscle weakness (generalized): Secondary | ICD-10-CM

## 2019-10-02 NOTE — Therapy (Signed)
[facility] [place], [place], [address] Phone: [phone]   Fax:  [phone]  Physical Therapy Treatment  Patient Details  Name: Ian Grimes MRN: [id number] Date of Birth: 1970/04/04 Referring Provider (PT): Pollie Friar, [place]   Encounter Date: 10/02/2019   PT End of Session - 10/02/19 1427    Visit Number 2    Number of Visits 12    Date for PT Re-Evaluation 11/18/19    Authorization Type Progress note every 10th visit    PT Start Time 0145    PT Stop Time 0228    PT Time Calculation (min) 43 min    Activity Tolerance Patient tolerated treatment well    Behavior During Therapy [facility] for tasks assessed/performed           Past Medical History:  Diagnosis Date  . Anxiety   . Aortic aneurysm (HCC)    repaired  . Depression   . HA (headache)   . Hx of echocardiogram    Echo (10/15):  Mild LVH, EF 60-65%, no RWMA, trivial AI, mild LAE  . Hyperlipidemia   . Pleural effusion   . Pulmonary emboli ([place])   . Pulmonary nodule    follow up CT 6 months (due 9/15)  . Schizo-affective psychosis ([place])     Past Surgical History:  Procedure Laterality Date  . ASCENDING AORTIC ROOT REPLACEMENT N/A 11/14/2013   Procedure: Value sparing Root replacement, ASCENDING AORTIC ROOT REPLACEMENT, circ Arrest;  Surgeon: Gaye Pollack, MD;  Location: [place];  Service: Open Heart Surgery;  Laterality: N/A;  . INTRAOPERATIVE TRANSESOPHAGEAL ECHOCARDIOGRAM N/A 11/14/2013   Procedure: INTRAOPERATIVE TRANSESOPHAGEAL ECHOCARDIOGRAM;  Surgeon: Gaye Pollack, MD;  Location: [place] [facility];  Service: Open Heart Surgery;  Laterality: N/A;  . LEFT AND RIGHT HEART CATHETERIZATION WITH CORONARY ANGIOGRAM N/A 11/01/2013   Procedure: LEFT AND RIGHT HEART CATHETERIZATION WITH CORONARY ANGIOGRAM;  Surgeon: Josue Hector, MD;  Location: [facility] CATH LAB;  Service: Cardiovascular;  Laterality: N/A;    There were no vitals filed for this visit.   Subjective  Assessment - 10/02/19 1353    Subjective COVID-19 screening performed upon arrival. Patient arrived with some soreness from prolong use use of putty    Pertinent History R radial nerve palsy, July 2021, anxiety, depression    Limitations Lifting;Writing;House hold activities    Diagnostic tests none    Patient Stated Goals use right arm again    Currently in Pain? No/denies                             [facility] Adult PT Treatment/Exercise - 10/02/19 0001      Exercises   Exercises Hand;Wrist      Hand Exercises   Other Hand Exercises finger ext x20    Other Hand Exercises prayer stretch x10, radial nerve glides x10      Wrist Exercises   Other wrist exercises stretches for wrist ext and flexion 30sec x3 each      Modalities   Modalities Electrical Stimulation      Electrical Stimulation   Electrical Stimulation Location right wrist extensors    Electrical Stimulation Action VMS 10/10 x44min for actvation    Electrical Stimulation Goals Strength;Neuromuscular facilitation   manual assist for wrist ext  to help into wrist ext     Manual Therapy   Manual Therapy Passive ROM    Passive ROM manual PROM for  wrist ext and finger ext to improve mobility                    PT Short Term Goals - 09/30/19 1954      PT SHORT TERM GOAL #1   Title STG=LTG             PT Long Term Goals - 09/30/19 2013      PT LONG TERM GOAL #1   Title Patient will be independent with HEP    Time 6    Period Weeks    Status New      PT LONG TERM GOAL #2   Title Patient will demonstrate 60+ degrees of right wrist extension AROM to improve ability to perform functional tasks    Time 6    Period Weeks      PT LONG TERM GOAL #3   Title Patient will demonstrate 60+ lbs of R hand grip strength to improve ability to perform functional tasks.    Time 6    Period Weeks    Status New      PT LONG TERM GOAL #4   Title Patient will demonstrate 3+/5 or greater R wrist  MMT in all planes to improve stability during functional tasks.    Time 6    Period Weeks    Status New      PT LONG TERM GOAL #5   Title Patient will report ability to perform ADLs and home tasks with right arm pain less than or equal to 3/10.    Time 6    Period Weeks    Status New                 Plan - 10/02/19 1428    Clinical Impression Statement Patient tolerated treatment well today. Good activation with VMS yet not fully able to perform active wrist ext and required manual assist throughout, had good thumb activation with VMS. Today focused on manual wrist ext and finger ext to improve mobility and avoid stiffness into flexion. patient doing putty for an hour at a time which caused sorness. Educated patient on less time frame with prayer stretches and radial glides at home. goals ongoing today    Personal Factors and Comorbidities Comorbidity 2    Examination-Activity Limitations Dressing;Hygiene/Grooming;Lift;Carry    Examination-Participation Restrictions Laundry    Stability/Clinical Decision Making Stable/Uncomplicated    Rehab Potential Good    PT Frequency 2x / week    PT Duration 6 weeks    PT Treatment/Interventions Electrical Stimulation;ADLs/Self Care Home Management;Ultrasound;Therapeutic exercise;Neuromuscular re-education;Manual techniques;Therapeutic activities;Patient/family education;Passive range of motion;Moist Heat;Functional mobility training;Cryotherapy    PT Next Visit Plan right grip strength, wrist ROM, VMS to wrist extensors, modalities PRN for pain relief    Consulted and Agree with Plan of Care Patient           Patient will benefit from skilled therapeutic intervention in order to improve the following deficits and impairments:  Pain, Impaired UE functional use, Decreased activity tolerance, Decreased mobility, Decreased strength, Decreased range of motion  Visit Diagnosis: Pain in right arm  Muscle weakness (generalized)     Problem  List Patient Active Problem List   Diagnosis Date Noted  . GAD (generalized anxiety disorder) 03/28/2018  . Sinobronchitis 12/02/2015  . History of pulmonary embolism 06/01/2015  . Alpha-1-antitrypsin deficiency ([place]) 06/01/2015  . Stage 2 moderate COPD by GOLD classification ([place]) 11/19/2014  . Snoring 02/03/2014  . S/P ascending aortic replacement 11/19/2013  .  Congenital bicuspid aortic valve 11/07/2013  . Tobacco use 05/06/2013  . Hyperlipidemia with target low density lipoprotein (LDL) cholesterol less than 100 mg/dL 01/18/2013  . Schizoaffective disorder, bipolar type ([place]) 07/17/2012  . Atypical migraine 11/28/2011    Phillips Climes, PTA 10/02/2019, 2:35 PM  [facility] [place], [place], [address] Phone: [phone]   Fax:  [phone]  Name: Ian Grimes MRN: [id number] Date of Birth: 1970-11-25

## 2019-10-02 NOTE — Telephone Encounter (Signed)
Patient of yours with alpha-1 antitrypsin deficiency. He was on prolastin but stopped several months ago on his own. Breathing has worsened off of this. He had some issues with nursing and home infusions. I am going to get him restarted on [place]. [facility] will be able to provide home health for this.

## 2019-10-03 ENCOUNTER — Telehealth: Payer: Self-pay | Admitting: Primary Care

## 2019-10-03 NOTE — Telephone Encounter (Signed)
Ok thanks a lot. Much appreciate your efforts. Closing messge. No need to reply

## 2019-10-03 NOTE — Telephone Encounter (Signed)
Attempted to call Estill Bamberg, line rang X3 then went to fast busy signal with no option for vm.   Wcb.

## 2019-10-04 NOTE — Telephone Encounter (Signed)
ATC, line rang several times then went to fast busy signal x2. Will try back.

## 2019-10-07 NOTE — Telephone Encounter (Signed)
ATC, line rang several times then went to fast busy signal x2. We have attempted to contact [place] several times with no success or call back from her. Per triage protocol, message will be closed.

## 2019-10-07 NOTE — Addendum Note (Signed)
Addended by: Gabriela Eves on: 10/07/2019 04:40 PM   Modules accepted: Orders

## 2019-10-10 ENCOUNTER — Ambulatory Visit: Payer: Medicare Other | Admitting: Physical Therapy

## 2019-10-10 ENCOUNTER — Other Ambulatory Visit: Payer: Self-pay

## 2019-10-10 DIAGNOSIS — M79601 Pain in right arm: Secondary | ICD-10-CM

## 2019-10-10 DIAGNOSIS — M6281 Muscle weakness (generalized): Secondary | ICD-10-CM

## 2019-10-10 DIAGNOSIS — G5631 Lesion of radial nerve, right upper limb: Secondary | ICD-10-CM | POA: Diagnosis not present

## 2019-10-10 NOTE — Therapy (Signed)
[facility] [place], [place], [address] Phone: [phone]   Fax:  [phone]  Physical Therapy Treatment  Patient Details  Name: Ian Grimes MRN: [id number] Date of Birth: February 12, 1971 Referring Provider (PT): Pollie Friar, [place]   Encounter Date: 10/10/2019   PT End of Session - 10/10/19 1357    Visit Number 3    Number of Visits 12    Date for PT Re-Evaluation 11/18/19    Authorization Type Progress note every 10th visit    PT Start Time 0146    PT Stop Time 0227    PT Time Calculation (min) 41 min    Activity Tolerance Patient tolerated treatment well    Behavior During Therapy [facility] for tasks assessed/performed           Past Medical History:  Diagnosis Date  . Anxiety   . Aortic aneurysm (HCC)    repaired  . Depression   . HA (headache)   . Hx of echocardiogram    Echo (10/15):  Mild LVH, EF 60-65%, no RWMA, trivial AI, mild LAE  . Hyperlipidemia   . Pleural effusion   . Pulmonary emboli ([place])   . Pulmonary nodule    follow up CT 6 months (due 9/15)  . Schizo-affective psychosis ([place])     Past Surgical History:  Procedure Laterality Date  . ASCENDING AORTIC ROOT REPLACEMENT N/A 11/14/2013   Procedure: Value sparing Root replacement, ASCENDING AORTIC ROOT REPLACEMENT, circ Arrest;  Surgeon: Gaye Pollack, MD;  Location: [place];  Service: Open Heart Surgery;  Laterality: N/A;  . INTRAOPERATIVE TRANSESOPHAGEAL ECHOCARDIOGRAM N/A 11/14/2013   Procedure: INTRAOPERATIVE TRANSESOPHAGEAL ECHOCARDIOGRAM;  Surgeon: Gaye Pollack, MD;  Location: [place] [facility];  Service: Open Heart Surgery;  Laterality: N/A;  . LEFT AND RIGHT HEART CATHETERIZATION WITH CORONARY ANGIOGRAM N/A 11/01/2013   Procedure: LEFT AND RIGHT HEART CATHETERIZATION WITH CORONARY ANGIOGRAM;  Surgeon: Josue Hector, MD;  Location: [facility] CATH LAB;  Service: Cardiovascular;  Laterality: N/A;    There were no vitals filed for this visit.   Subjective  Assessment - 10/10/19 1351    Subjective COVID-19 screening performed upon arrival. Patient arrived with no pain, did good after last treatment, yet still unable to use right hand    Pertinent History R radial nerve palsy, July 2021, anxiety, depression    Limitations Lifting;Writing;House hold activities    Diagnostic tests none    Patient Stated Goals use right arm again    Currently in Pain? No/denies                             [facility] Adult PT Treatment/Exercise - 10/10/19 0001      Hand Exercises   Other Hand Exercises prayer stretch x10, radial nerve glides x10      Wrist Exercises   Other wrist exercises UE ranger for wrist ext x13min, cane bil UE for wrist ext x 2 min      Electrical Stimulation   Electrical Stimulation Location right wrist extensors    Electrical Stimulation Action VMS 10/10 x53min for activation    Electrical Stimulation Parameters with UE ranger for wrist ext to strengthen extensors    Electrical Stimulation Goals Strength;Neuromuscular facilitation      Manual Therapy   Manual Therapy Passive ROM    Passive ROM manual PROM for  wrist ext and finger ext to improve mobility  PT Short Term Goals - 09/30/19 1954      PT SHORT TERM GOAL #1   Title STG=LTG             PT Long Term Goals - 10/10/19 1356      PT LONG TERM GOAL #1   Title Patient will be independent with HEP    Time 6    Period Weeks    Status On-going      PT LONG TERM GOAL #2   Title Patient will demonstrate 60+ degrees of right wrist extension AROM to improve ability to perform functional tasks    Baseline AROM -40 degrees 10/10/19    Time 6    Period Weeks    Status On-going      PT LONG TERM GOAL #3   Title Patient will demonstrate 60+ lbs of R hand grip strength to improve ability to perform functional tasks.    Baseline Right grip strength 28 lbs 10/10/19    Time 6    Period Weeks    Status On-going      PT LONG TERM  GOAL #4   Title Patient will demonstrate 3+/5 or greater R wrist MMT in all planes to improve stability during functional tasks.    Time 6    Period Weeks    Status On-going      PT LONG TERM GOAL #5   Title Patient will report ability to perform ADLs and home tasks with right arm pain less than or equal to 3/10.    Time 6    Period Weeks    Status On-going                 Plan - 10/10/19 1401    Clinical Impression Statement Patient tolerated treatment fair today due to lack of use of wright hand. Patient able to perform assisted ext today and some activation with VMS with middle digit today. Patient has no pain just non use of wrist extensors.Patient goals progressing. patient will be calling MD for further testing due to lack of use of wrist/hand.    Personal Factors and Comorbidities Comorbidity 2    Examination-Activity Limitations Dressing;Hygiene/Grooming;Lift;Carry    Examination-Participation Restrictions Laundry    Stability/Clinical Decision Making Stable/Uncomplicated    Rehab Potential Good    PT Frequency 2x / week    PT Duration 6 weeks    PT Treatment/Interventions Electrical Stimulation;ADLs/Self Care Home Management;Ultrasound;Therapeutic exercise;Neuromuscular re-education;Manual techniques;Therapeutic activities;Patient/family education;Passive range of motion;Moist Heat;Functional mobility training;Cryotherapy    PT Next Visit Plan right grip strength, wrist ROM, VMS to wrist extensors, modalities PRN for pain relief    Consulted and Agree with Plan of Care Patient           Patient will benefit from skilled therapeutic intervention in order to improve the following deficits and impairments:  Pain, Impaired UE functional use, Decreased activity tolerance, Decreased mobility, Decreased strength, Decreased range of motion  Visit Diagnosis: Pain in right arm  Muscle weakness (generalized)     Problem List Patient Active Problem List   Diagnosis Date  Noted  . GAD (generalized anxiety disorder) 03/28/2018  . Sinobronchitis 12/02/2015  . History of pulmonary embolism 06/01/2015  . Alpha-1-antitrypsin deficiency ([place]) 06/01/2015  . Stage 2 moderate COPD by GOLD classification ([place]) 11/19/2014  . Snoring 02/03/2014  . S/P ascending aortic replacement 11/19/2013  . Congenital bicuspid aortic valve 11/07/2013  . Tobacco use 05/06/2013  . Hyperlipidemia with target low density lipoprotein (LDL) cholesterol less  than 100 mg/dL 01/18/2013  . Schizoaffective disorder, bipolar type ([place]) 07/17/2012  . Atypical migraine 11/28/2011    Phillips Climes, PTA 10/10/2019, 2:30 PM  [facility] [address] [place], [place], [address] Phone: [phone]   Fax:  [phone]  Name: Ian Grimes MRN: [id number] Date of Birth: 03-28-1970

## 2019-10-14 ENCOUNTER — Ambulatory Visit: Payer: Medicare Other | Admitting: Physical Therapy

## 2019-10-14 ENCOUNTER — Telehealth: Payer: Self-pay | Admitting: Primary Care

## 2019-10-14 DIAGNOSIS — M6281 Muscle weakness (generalized): Secondary | ICD-10-CM

## 2019-10-14 DIAGNOSIS — M79601 Pain in right arm: Secondary | ICD-10-CM

## 2019-10-14 DIAGNOSIS — G5631 Lesion of radial nerve, right upper limb: Secondary | ICD-10-CM | POA: Diagnosis not present

## 2019-10-14 NOTE — Telephone Encounter (Signed)
error 

## 2019-10-14 NOTE — Therapy (Signed)
[facility] [place], [place], [address] Phone: [phone]   Fax:  [phone]  Physical Therapy Treatment  Patient Details  Name: Ian Grimes MRN: [id number] Date of Birth: 1970-09-05 Referring Provider (PT): Pollie Friar, [place]   Encounter Date: 10/14/2019   PT End of Session - 10/14/19 1346    Visit Number 4    Number of Visits 12    Date for PT Re-Evaluation 11/18/19    Authorization Type Progress note every 10th visit    PT Start Time 0143    PT Stop Time 0219    PT Time Calculation (min) 36 min    Activity Tolerance Patient tolerated treatment well    Behavior During Therapy [facility] for tasks assessed/performed           Past Medical History:  Diagnosis Date  . Anxiety   . Aortic aneurysm (HCC)    repaired  . Depression   . HA (headache)   . Hx of echocardiogram    Echo (10/15):  Mild LVH, EF 60-65%, no RWMA, trivial AI, mild LAE  . Hyperlipidemia   . Pleural effusion   . Pulmonary emboli ([place])   . Pulmonary nodule    follow up CT 6 months (due 9/15)  . Schizo-affective psychosis ([place])     Past Surgical History:  Procedure Laterality Date  . ASCENDING AORTIC ROOT REPLACEMENT N/A 11/14/2013   Procedure: Value sparing Root replacement, ASCENDING AORTIC ROOT REPLACEMENT, circ Arrest;  Surgeon: Gaye Pollack, MD;  Location: [place];  Service: Open Heart Surgery;  Laterality: N/A;  . INTRAOPERATIVE TRANSESOPHAGEAL ECHOCARDIOGRAM N/A 11/14/2013   Procedure: INTRAOPERATIVE TRANSESOPHAGEAL ECHOCARDIOGRAM;  Surgeon: Gaye Pollack, MD;  Location: [place] [facility];  Service: Open Heart Surgery;  Laterality: N/A;  . LEFT AND RIGHT HEART CATHETERIZATION WITH CORONARY ANGIOGRAM N/A 11/01/2013   Procedure: LEFT AND RIGHT HEART CATHETERIZATION WITH CORONARY ANGIOGRAM;  Surgeon: Josue Hector, MD;  Location: [facility] CATH LAB;  Service: Cardiovascular;  Laterality: N/A;    There were no vitals filed for this visit.   Subjective  Assessment - 10/14/19 1346    Subjective COVID-19 screening performed upon arrival. Patient arrived with no pain and no improvement    Pertinent History R radial nerve palsy, July 2021, anxiety, depression    Limitations Lifting;Writing;House hold activities    Diagnostic tests none    Patient Stated Goals use right arm again    Currently in Pain? No/denies                             [facility] Adult PT Treatment/Exercise - 10/14/19 0001      Exercises   Exercises Shoulder;Wrist;Hand      Shoulder Exercises: ROM/Strengthening   Wall Pushups 20 reps      Hand Exercises   Digiticizer digiflex2.0 x30 reps    Other Hand Exercises red web for finger gripping 10sec hold x21min    Other Hand Exercises prayer stretch x10, radial nerve glides x10      Wrist Exercises   Other wrist exercises seated UE ranger for ext x 43min    Other wrist exercises seated cane for ext bil UE x71min      Electrical Stimulation   Electrical Stimulation Location right wrist extensors    Electrical Stimulation Action russian ES @ 10/10 x56min for activation    Electrical Stimulation Parameters with UE ranger for ext    Printmaker Goals  Strength;Neuromuscular facilitation                    PT Short Term Goals - 09/30/19 1954      PT SHORT TERM GOAL #1   Title STG=LTG             PT Long Term Goals - 10/10/19 1356      PT LONG TERM GOAL #1   Title Patient will be independent with HEP    Time 6    Period Weeks    Status On-going      PT LONG TERM GOAL #2   Title Patient will demonstrate 60+ degrees of right wrist extension AROM to improve ability to perform functional tasks    Baseline AROM -40 degrees 10/10/19    Time 6    Period Weeks    Status On-going      PT LONG TERM GOAL #3   Title Patient will demonstrate 60+ lbs of R hand grip strength to improve ability to perform functional tasks.    Baseline Right grip strength 28 lbs 10/10/19    Time 6     Period Weeks    Status On-going      PT LONG TERM GOAL #4   Title Patient will demonstrate 3+/5 or greater R wrist MMT in all planes to improve stability during functional tasks.    Time 6    Period Weeks    Status On-going      PT LONG TERM GOAL #5   Title Patient will report ability to perform ADLs and home tasks with right arm pain less than or equal to 3/10.    Time 6    Period Weeks    Status On-going                 Plan - 10/14/19 1413    Clinical Impression Statement Patient tolerated treatment well today. Patient reported no improvement with ext movements and doing theraputty at home daily. Patient improved with wrist ext activation with russian ES today. Patient current goals ongoing. Normal response upon removal of modalities.    Personal Factors and Comorbidities Comorbidity 2    Examination-Activity Limitations Dressing;Hygiene/Grooming;Lift;Carry    Examination-Participation Restrictions Laundry    Stability/Clinical Decision Making Stable/Uncomplicated    Rehab Potential Good    PT Frequency 2x / week    PT Duration 6 weeks    PT Treatment/Interventions Electrical Stimulation;ADLs/Self Care Home Management;Ultrasound;Therapeutic exercise;Neuromuscular re-education;Manual techniques;Therapeutic activities;Patient/family education;Passive range of motion;Moist Heat;Functional mobility training;Cryotherapy    PT Next Visit Plan cont with POC for right grip strength, wrist ext ROM, VMS or russian ES to wrist extensors    Consulted and Agree with Plan of Care Patient           Patient will benefit from skilled therapeutic intervention in order to improve the following deficits and impairments:  Pain, Impaired UE functional use, Decreased activity tolerance, Decreased mobility, Decreased strength, Decreased range of motion  Visit Diagnosis: Pain in right arm  Muscle weakness (generalized)     Problem List Patient Active Problem List   Diagnosis Date Noted   . GAD (generalized anxiety disorder) 03/28/2018  . Sinobronchitis 12/02/2015  . History of pulmonary embolism 06/01/2015  . Alpha-1-antitrypsin deficiency ([place]) 06/01/2015  . Stage 2 moderate COPD by GOLD classification ([place]) 11/19/2014  . Snoring 02/03/2014  . S/P ascending aortic replacement 11/19/2013  . Congenital bicuspid aortic valve 11/07/2013  . Tobacco use 05/06/2013  . Hyperlipidemia with target  low density lipoprotein (LDL) cholesterol less than 100 mg/dL 01/18/2013  . Schizoaffective disorder, bipolar type ([place]) 07/17/2012  . Atypical migraine 11/28/2011    Phillips Climes, PTA 10/14/2019, 2:32 PM  [facility] [place], [place], [address] Phone: [phone]   Fax:  [phone]  Name: Ian Grimes MRN: [id number] Date of Birth: December 03, 1970

## 2019-10-16 ENCOUNTER — Ambulatory Visit: Payer: Medicare Other | Admitting: Physical Therapy

## 2019-10-16 ENCOUNTER — Other Ambulatory Visit: Payer: Self-pay

## 2019-10-16 DIAGNOSIS — M79601 Pain in right arm: Secondary | ICD-10-CM

## 2019-10-16 DIAGNOSIS — G5631 Lesion of radial nerve, right upper limb: Secondary | ICD-10-CM | POA: Diagnosis not present

## 2019-10-16 DIAGNOSIS — M6281 Muscle weakness (generalized): Secondary | ICD-10-CM

## 2019-10-16 NOTE — Therapy (Signed)
[facility] [place], [place], [address] Phone: [phone]   Fax:  [phone]  Physical Therapy Treatment  Patient Details  Name: Ian Grimes MRN: [id number] Date of Birth: 01/13/1971 Referring Provider (PT): Pollie Friar, [place]   Encounter Date: 10/16/2019   PT End of Session - 10/16/19 1352    Visit Number 5    Number of Visits 12    Date for PT Re-Evaluation 11/18/19    Authorization Type Progress note every 10th visit    PT Start Time 0145    PT Stop Time 0225    PT Time Calculation (min) 40 min    Activity Tolerance Patient tolerated treatment well    Behavior During Therapy [facility] for tasks assessed/performed           Past Medical History:  Diagnosis Date  . Anxiety   . Aortic aneurysm (HCC)    repaired  . Depression   . HA (headache)   . Hx of echocardiogram    Echo (10/15):  Mild LVH, EF 60-65%, no RWMA, trivial AI, mild LAE  . Hyperlipidemia   . Pleural effusion   . Pulmonary emboli ([place])   . Pulmonary nodule    follow up CT 6 months (due 9/15)  . Schizo-affective psychosis ([place])     Past Surgical History:  Procedure Laterality Date  . ASCENDING AORTIC ROOT REPLACEMENT N/A 11/14/2013   Procedure: Value sparing Root replacement, ASCENDING AORTIC ROOT REPLACEMENT, circ Arrest;  Surgeon: Gaye Pollack, MD;  Location: [place];  Service: Open Heart Surgery;  Laterality: N/A;  . INTRAOPERATIVE TRANSESOPHAGEAL ECHOCARDIOGRAM N/A 11/14/2013   Procedure: INTRAOPERATIVE TRANSESOPHAGEAL ECHOCARDIOGRAM;  Surgeon: Gaye Pollack, MD;  Location: [facility];  Service: Open Heart Surgery;  Laterality: N/A;  . LEFT AND RIGHT HEART CATHETERIZATION WITH CORONARY ANGIOGRAM N/A 11/01/2013   Procedure: LEFT AND RIGHT HEART CATHETERIZATION WITH CORONARY ANGIOGRAM;  Surgeon: Josue Hector, MD;  Location: [facility] CATH LAB;  Service: Cardiovascular;  Laterality: N/A;    There were no vitals filed for this visit.   Subjective  Assessment - 10/16/19 1348    Subjective COVID-19 screening performed upon arrival. Patient arrived with no pain and reported some improved mobility yet no ext improvement    Pertinent History R radial nerve palsy, July 2021, anxiety, depression    Limitations Lifting;Writing;House hold activities    Diagnostic tests none    Patient Stated Goals use right arm again    Currently in Pain? No/denies              [facility] PT Assessment - 10/16/19 0001      Strength   Right Hand Grip (lbs) 35                         [facility] Adult PT Treatment/Exercise - 10/16/19 0001      Shoulder Exercises: Standing   Other Standing Exercises standing arm bike to promote wrist ext x12min      Shoulder Exercises: ROM/Strengthening   Wall Pushups 20 reps      Hand Exercises   Digiticizer digiflex2.0 x40min    Other Hand Exercises red web for finger gripping 10sec hold x10min    Other Hand Exercises prayer stretch x10, radial nerve glides x10      Wrist Exercises   Other wrist exercises seated UE ranger for ext x 79min    Other wrist exercises seated cane for ext bil UE x33min  Acupuncturist Location right wrist extensors    Chartered certified accountant russian ES 10/10 x74min    Electrical Stimulation Parameters w UE ranger into ext for activation    Printmaker Goals Strength;Neuromuscular facilitation                    PT Short Term Goals - 09/30/19 1954      PT SHORT TERM GOAL #1   Title STG=LTG             PT Long Term Goals - 10/16/19 1410      PT LONG TERM GOAL #1   Title Patient will be independent with HEP    Time 6    Period Weeks    Status On-going      PT LONG TERM GOAL #2   Title Patient will demonstrate 60+ degrees of right wrist extension AROM to improve ability to perform functional tasks    Baseline AROM -40 degrees 10/16/19    Time 6    Period Weeks    Status On-going      PT LONG TERM GOAL  #3   Title Patient will demonstrate 60+ lbs of R hand grip strength to improve ability to perform functional tasks.    Baseline Right grip strength 35 lbs 10/16/19    Time 6    Period Weeks    Status On-going      PT LONG TERM GOAL #4   Title Patient will demonstrate 3+/5 or greater R wrist MMT in all planes to improve stability during functional tasks.    Time 6    Period Weeks    Status On-going      PT LONG TERM GOAL #5   Title Patient will report ability to perform ADLs and home tasks with right arm pain less than or equal to 3/10.    Baseline no pain just unable to perform ADL's 10/16/19    Time 6    Period Weeks    Status On-going                 Plan - 10/16/19 1403    Clinical Impression Statement Patient tolerated treatment well today. Patient able to perform active assisted exercises with good wrist ext today. No active wrist exension today. Patient continues to perform exercises and stretches at home. Improved grip strength to 35# today. Goals ongoing this week.    Personal Factors and Comorbidities Comorbidity 2    Examination-Activity Limitations Dressing;Hygiene/Grooming;Lift;Carry    Examination-Participation Restrictions Laundry    Stability/Clinical Decision Making Stable/Uncomplicated    Rehab Potential Good    PT Frequency 2x / week    PT Duration 6 weeks    PT Treatment/Interventions Electrical Stimulation;ADLs/Self Care Home Management;Ultrasound;Therapeutic exercise;Neuromuscular re-education;Manual techniques;Therapeutic activities;Patient/family education;Passive range of motion;Moist Heat;Functional mobility training;Cryotherapy    PT Next Visit Plan cont with POC for right grip strength, wrist ext ROM, VMS or russian ES to wrist extensors and add UBE with wrist ext    Consulted and Agree with Plan of Care Patient           Patient will benefit from skilled therapeutic intervention in order to improve the following deficits and impairments:  Pain,  Impaired UE functional use, Decreased activity tolerance, Decreased mobility, Decreased strength, Decreased range of motion  Visit Diagnosis: Pain in right arm  Muscle weakness (generalized)     Problem List Patient Active Problem List   Diagnosis Date Noted  . GAD (  generalized anxiety disorder) 03/28/2018  . Sinobronchitis 12/02/2015  . History of pulmonary embolism 06/01/2015  . Alpha-1-antitrypsin deficiency ([place]) 06/01/2015  . Stage 2 moderate COPD by GOLD classification ([place]) 11/19/2014  . Snoring 02/03/2014  . S/P ascending aortic replacement 11/19/2013  . Congenital bicuspid aortic valve 11/07/2013  . Tobacco use 05/06/2013  . Hyperlipidemia with target low density lipoprotein (LDL) cholesterol less than 100 mg/dL 01/18/2013  . Schizoaffective disorder, bipolar type ([place]) 07/17/2012  . Atypical migraine 11/28/2011    Phillips Climes, PTA 10/16/2019, 2:26 PM  [facility] [place], [place], [address] Phone: [phone]   Fax:  [phone]  Name: Ian Grimes MRN: [id number] Date of Birth: 23-Dec-1970

## 2019-10-21 ENCOUNTER — Other Ambulatory Visit: Payer: Self-pay

## 2019-10-21 ENCOUNTER — Telehealth (HOSPITAL_COMMUNITY): Payer: Self-pay | Admitting: *Deleted

## 2019-10-21 ENCOUNTER — Ambulatory Visit: Payer: Medicare Other | Admitting: Physical Therapy

## 2019-10-21 ENCOUNTER — Other Ambulatory Visit (HOSPITAL_COMMUNITY): Payer: Self-pay | Admitting: Psychiatry

## 2019-10-21 DIAGNOSIS — M79601 Pain in right arm: Secondary | ICD-10-CM

## 2019-10-21 DIAGNOSIS — M6281 Muscle weakness (generalized): Secondary | ICD-10-CM

## 2019-10-21 DIAGNOSIS — G5631 Lesion of radial nerve, right upper limb: Secondary | ICD-10-CM | POA: Diagnosis not present

## 2019-10-21 MED ORDER — CLONAZEPAM 1 MG PO TABS: ORAL_TABLET | ORAL | 0 refills | Status: DC

## 2019-10-21 NOTE — Telephone Encounter (Signed)
Pt called requesting refill on the Klonopin 1 mg last ordered on 09/10/19 to be filled on 09/28/19. Pt says he's good until the end of the week. Just being proactive. Please review.

## 2019-10-21 NOTE — Therapy (Signed)
[facility] [place], [place], [address] Phone: [phone]   Fax:  [phone]  Physical Therapy Treatment  Patient Details  Name: Ian Grimes MRN: [id number] Date of Birth: [birth year] Referring Provider (PT): Pollie Friar, [place]   Encounter Date: 10/21/2019   PT End of Session - 10/21/19 1410    Visit Number 6    Number of Visits 12    Date for PT Re-Evaluation 11/18/19    Authorization Type Progress note every 10th visit    PT Start Time 0145    PT Stop Time 0225    PT Time Calculation (min) 40 min    Activity Tolerance Patient tolerated treatment well    Behavior During Therapy [facility] for tasks assessed/performed           Past Medical History:  Diagnosis Date   Anxiety    Aortic aneurysm ([place])    repaired   Depression    HA (headache)    Hx of echocardiogram    Echo (10/15):  Mild LVH, EF 60-65%, no RWMA, trivial AI, mild LAE   Hyperlipidemia    Pleural effusion    Pulmonary emboli (HCC)    Pulmonary nodule    follow up CT 6 months (due 9/15)   Schizo-affective psychosis ([place])     Past Surgical History:  Procedure Laterality Date   ASCENDING AORTIC ROOT REPLACEMENT N/A 11/14/2013   Procedure: Value sparing Root replacement, ASCENDING AORTIC ROOT REPLACEMENT, circ Arrest;  Surgeon: Gaye Pollack, MD;  Location: [place];  Service: Open Heart Surgery;  Laterality: N/A;   INTRAOPERATIVE TRANSESOPHAGEAL ECHOCARDIOGRAM N/A 11/14/2013   Procedure: INTRAOPERATIVE TRANSESOPHAGEAL ECHOCARDIOGRAM;  Surgeon: Gaye Pollack, MD;  Location: [place] [facility];  Service: Open Heart Surgery;  Laterality: N/A;   LEFT AND RIGHT HEART CATHETERIZATION WITH CORONARY ANGIOGRAM N/A 11/01/2013   Procedure: LEFT AND RIGHT HEART CATHETERIZATION WITH CORONARY ANGIOGRAM;  Surgeon: Josue Hector, MD;  Location: [facility] CATH LAB;  Service: Cardiovascular;  Laterality: N/A;    There were no vitals filed for this visit.   Subjective  Assessment - 10/21/19 1348    Subjective COVID-19 screening performed upon arrival. Patient reported no pain and reported some improved mobility yet no ext improvement    Pertinent History R radial nerve palsy, July 2021, anxiety, depression    Limitations Lifting;Writing;House hold activities    Diagnostic tests none    Patient Stated Goals use right arm again    Currently in Pain? No/denies                             [facility] Adult PT Treatment/Exercise - 10/21/19 0001      Shoulder Exercises: ROM/Strengthening   UBE (Upper Arm Bike) 66min 90RPM to promote wrist ext    Wall Pushups 20 reps      Hand Exercises   Digiticizer digiflex 2.0 x78min    Other Hand Exercises red web for finger gripping 10sec hold x72min    Other Hand Exercises prayer stretch x10, radial nerve glides x10      Wrist Exercises   Other wrist exercises seated cane for ext bil UE x82min      Electrical Stimulation   Electrical Stimulation Location right wrist extensors    Electrical Stimulation Action Russian ES 10/10 x43min    Electrical Stimulation Parameters with UE ranger for wrist ext for activation    Electrical Stimulation Goals Strength;Neuromuscular facilitation  PT Short Term Goals - 09/30/19 1954      PT SHORT TERM GOAL #1   Title STG=LTG             PT Long Term Goals - 10/21/19 1403      PT LONG TERM GOAL #1   Title Patient will be independent with HEP    Time 6    Period Weeks    Status On-going      PT LONG TERM GOAL #2   Title Patient will demonstrate 60+ degrees of right wrist extension AROM to improve ability to perform functional tasks    Baseline AROM -40 degrees 10/21/19    Time 6    Period Weeks    Status On-going      PT LONG TERM GOAL #3   Title Patient will demonstrate 60+ lbs of R hand grip strength to improve ability to perform functional tasks.    Baseline Right grip strength 35 lbs 10/16/19    Time 6    Period Weeks     Status On-going      PT LONG TERM GOAL #4   Title Patient will demonstrate 3+/5 or greater R wrist MMT in all planes to improve stability during functional tasks.    Time 6    Period Weeks    Status On-going      PT LONG TERM GOAL #5   Title Patient will report ability to perform ADLs and home tasks with right arm pain less than or equal to 3/10.    Baseline no pain just unable to perform ADL's with wrist 10/21/19    Time 6    Period Weeks    Status On-going                 Plan - 10/21/19 1404    Clinical Impression Statement Patient tolerated treatment well today. patient continues to have no pain yet no improvement with wrist ext. Patient is able to move wrist passively into extension yet not active. Goals ongoing today.    Personal Factors and Comorbidities Comorbidity 2    Examination-Activity Limitations Dressing;Hygiene/Grooming;Lift;Carry    Examination-Participation Restrictions Laundry    Stability/Clinical Decision Making Stable/Uncomplicated    Rehab Potential Good    PT Frequency 2x / week    PT Duration 6 weeks    PT Treatment/Interventions Electrical Stimulation;ADLs/Self Care Home Management;Ultrasound;Therapeutic exercise;Neuromuscular re-education;Manual techniques;Therapeutic activities;Patient/family education;Passive range of motion;Moist Heat;Functional mobility training;Cryotherapy    PT Next Visit Plan cont with POC for right grip strength, wrist ext ROM, VMS or russian ES to wrist extensors and add UBE with wrist ext    Consulted and Agree with Plan of Care Patient           Patient will benefit from skilled therapeutic intervention in order to improve the following deficits and impairments:  Pain, Impaired UE functional use, Decreased activity tolerance, Decreased mobility, Decreased strength, Decreased range of motion  Visit Diagnosis: Pain in right arm  Muscle weakness (generalized)     Problem List Patient Active Problem List    Diagnosis Date Noted   GAD (generalized anxiety disorder) 03/28/2018   Sinobronchitis 12/02/2015   History of pulmonary embolism 06/01/2015   Alpha-1-antitrypsin deficiency ([place]) 06/01/2015   Stage 2 moderate COPD by GOLD classification ([place]) 11/19/2014   Snoring 02/03/2014   S/P ascending aortic replacement 11/19/2013   Congenital bicuspid aortic valve 11/07/2013   Tobacco use 05/06/2013   Hyperlipidemia with target low density lipoprotein (LDL) cholesterol less than  100 mg/dL 01/18/2013   Schizoaffective disorder, bipolar type ([place]) 07/17/2012   Atypical migraine 11/28/2011    Latysha Thackston P, PTA 10/21/2019, 2:28 PM  [facility] [place], [place], [address] Phone: [phone]   Fax:  [phone]  Name: Ian Grimes MRN: [id number] Date of Birth: Jan 27, 1971

## 2019-10-21 NOTE — Telephone Encounter (Signed)
Proactive is good! Done.

## 2019-10-22 ENCOUNTER — Telehealth: Payer: Self-pay | Admitting: Family Medicine

## 2019-10-22 ENCOUNTER — Telehealth: Payer: Self-pay | Admitting: Internal Medicine

## 2019-10-22 DIAGNOSIS — G5631 Lesion of radial nerve, right upper limb: Secondary | ICD-10-CM

## 2019-10-22 NOTE — Telephone Encounter (Signed)
REFERRAL REQUEST Telephone Note  Have you been seen at our office for this problem? Yes (Advise that they may need an appointment with their PCP before a referral can be done)  Reason for Referral: Problem with Hands Referral discussed with patient: Yes Best contact number of patient for referral team:    Has patient been seen by a specialist for this issue before: NO Patient provider preference for referral:  Patient location preference for referral:  [facility]   Patient notified that referrals can take up to a week or longer to process. If they haven't heard anything within a week they should call back and speak with the referral department.

## 2019-10-22 NOTE — Telephone Encounter (Signed)
Skip Mayer from [facility] came by office asking for recent labs for alpha 1 before initiating Prolastin  and OV notes supporting that he was on Prolastin and that they are trying to get [place] approved.  Last 2 labs for Alpha 1 printed as well as last 2 office notes and given to [place]. She states that is what she needs and she would let us know if she needed anything else. Her number is [phone]. Nothing further needed at this time.

## 2019-10-22 NOTE — Telephone Encounter (Signed)
Is patient referring to his right arm/hand and the Saturday night palsy?

## 2019-10-23 ENCOUNTER — Encounter: Payer: Medicare Other | Admitting: Physical Therapy

## 2019-10-24 NOTE — Telephone Encounter (Signed)
Referral placed.

## 2019-10-28 ENCOUNTER — Ambulatory Visit: Payer: Medicare Other | Admitting: Physical Therapy

## 2019-10-30 ENCOUNTER — Ambulatory Visit: Payer: Medicare Other | Admitting: Physical Therapy

## 2019-11-06 ENCOUNTER — Encounter: Payer: Medicare Other | Admitting: Physical Therapy

## 2019-11-08 DIAGNOSIS — R209 Unspecified disturbances of skin sensation: Secondary | ICD-10-CM | POA: Diagnosis not present

## 2019-11-08 DIAGNOSIS — G5631 Lesion of radial nerve, right upper limb: Secondary | ICD-10-CM | POA: Diagnosis not present

## 2019-11-08 DIAGNOSIS — M79601 Pain in right arm: Secondary | ICD-10-CM | POA: Diagnosis not present

## 2019-11-11 ENCOUNTER — Encounter: Payer: Medicare Other | Admitting: Physical Therapy

## 2019-11-13 ENCOUNTER — Encounter: Payer: Medicare Other | Admitting: Physical Therapy

## 2019-11-25 ENCOUNTER — Other Ambulatory Visit (HOSPITAL_COMMUNITY): Payer: Self-pay | Admitting: Psychiatry

## 2019-11-26 ENCOUNTER — Telehealth (HOSPITAL_COMMUNITY): Payer: Self-pay | Admitting: *Deleted

## 2019-11-26 NOTE — Telephone Encounter (Signed)
Thanks I will call him 

## 2019-11-26 NOTE — Telephone Encounter (Signed)
Done

## 2019-11-26 NOTE — Telephone Encounter (Signed)
Patient called in and requested a refill of Klonopin.  He has an appointment on 10/13. Please review.

## 2019-12-02 ENCOUNTER — Other Ambulatory Visit: Payer: Self-pay | Admitting: Family Medicine

## 2019-12-02 MED ORDER — METOPROLOL TARTRATE 25 MG PO TABS
25.0000 mg | ORAL_TABLET | Freq: Two times a day (BID) | ORAL | 0 refills | Status: DC
Start: 2019-12-02 — End: 2020-03-04

## 2019-12-11 ENCOUNTER — Other Ambulatory Visit: Payer: Self-pay

## 2019-12-11 ENCOUNTER — Telehealth (INDEPENDENT_AMBULATORY_CARE_PROVIDER_SITE_OTHER): Payer: Medicare Other | Admitting: Psychiatry

## 2019-12-11 DIAGNOSIS — F250 Schizoaffective disorder, bipolar type: Secondary | ICD-10-CM | POA: Diagnosis not present

## 2019-12-11 DIAGNOSIS — F411 Generalized anxiety disorder: Secondary | ICD-10-CM | POA: Diagnosis not present

## 2019-12-11 MED ORDER — ZIPRASIDONE HCL 20 MG PO CAPS: 20.0000 mg | ORAL_CAPSULE | Freq: Every day | ORAL | 1 refills | Status: DC

## 2019-12-11 MED ORDER — ZIPRASIDONE HCL 40 MG PO CAPS
ORAL_CAPSULE | ORAL | 1 refills | Status: DC
Start: 2019-12-11 — End: 2020-03-12

## 2019-12-11 MED ORDER — TRAZODONE HCL 100 MG PO TABS
200.0000 mg | ORAL_TABLET | Freq: Every evening | ORAL | 1 refills | Status: DC | PRN
Start: 2019-12-11 — End: 2020-03-12

## 2019-12-11 MED ORDER — DIAZEPAM 5 MG PO TABS
5.0000 mg | ORAL_TABLET | Freq: Three times a day (TID) | ORAL | 2 refills | Status: AC | PRN
Start: 2019-12-11 — End: 2020-03-10

## 2019-12-11 MED ORDER — SERTRALINE HCL 100 MG PO TABS: 200.0000 mg | ORAL_TABLET | Freq: Every day | ORAL | 1 refills | Status: DC

## 2019-12-11 NOTE — Progress Notes (Signed)
[facility] MD/PA/NP OP Progress Note  12/11/2019 2:44 PM Ian Grimes  MRN:  [id number] Interview was conducted by phone and I verified that I was speaking with the correct person using two identifiers. I discussed the limitations of evaluation and management by telemedicine and  the availability of in person appointments. Patient expressed understanding and agreed to proceed. Patient location - home; physician - home office.  Chief Complaint: Some increase in depression/anxiety.  HPI: 49yo male with schizoaffective disorder bipolar type and GAD.No overt psychosis(denies having hallucinations, no delusions), depression under good control but still some anxiety(primarily in the middle of the day)reported. Sertraline andziprasidonedoses were increasedto 150 mg and 20mg  am/40 mg HS, respectively, and he tolerates both well.Clonazepam works better than hydroxyzine for anxiety but hewould at times forget that he took one and take another. This caused him to run out earlyin September and then again in December. He has since made arrangement with a trusted friend that he keeps his clonazepam and gives it to him when needed.He denies havingSI or mood fluctuations.Ian Grimes reported some recent problems with sleep which is interrupted. In the past he was on trazodone with good responseso I ordered 100 mg as needed at night. He ended up taking two of these and only then his sleep improved.  He does not use it every night. More depressed and anxious lately.   Visit Diagnosis:    ICD-10-CM   1. Schizoaffective disorder, bipolar type ([place])  F25.0   2. GAD (generalized anxiety disorder)  F41.1     Past Psychiatric History: Please see intake H&P>  Past Medical History:  Past Medical History:  Diagnosis Date  . Anxiety   . Aortic aneurysm (HCC)    repaired  . Depression   . HA (headache)   . Hx of echocardiogram    Echo (10/15):  Mild LVH, EF 60-65%, no RWMA, trivial AI, mild LAE  .  Hyperlipidemia   . Pleural effusion   . Pulmonary emboli ([place])   . Pulmonary nodule    follow up CT 6 months (due 9/15)  . Schizo-affective psychosis ([place])     Past Surgical History:  Procedure Laterality Date  . ASCENDING AORTIC ROOT REPLACEMENT N/A 11/14/2013   Procedure: Value sparing Root replacement, ASCENDING AORTIC ROOT REPLACEMENT, circ Arrest;  Surgeon: Gaye Pollack, MD;  Location: [facility];  Service: Open Heart Surgery;  Laterality: N/A;  . INTRAOPERATIVE TRANSESOPHAGEAL ECHOCARDIOGRAM N/A 11/14/2013   Procedure: INTRAOPERATIVE TRANSESOPHAGEAL ECHOCARDIOGRAM;  Surgeon: Gaye Pollack, MD;  Location: [facility] OR;  Service: Open Heart Surgery;  Laterality: N/A;  . LEFT AND RIGHT HEART CATHETERIZATION WITH CORONARY ANGIOGRAM N/A 11/01/2013   Procedure: LEFT AND RIGHT HEART CATHETERIZATION WITH CORONARY ANGIOGRAM;  Surgeon: Josue Hector, MD;  Location: [facility] CATH LAB;  Service: Cardiovascular;  Laterality: N/A;    Family Psychiatric History: Reviewed.  Family History:  Family History  Problem Relation Age of Onset  . Heart disease Father   . Stroke Father   . Heart attack Father   . COPD Father   . Alcohol abuse Father   . Lung cancer Father   . Diabetes Paternal Grandfather   . COPD Paternal 7   . Asthma Paternal Aunt   . Stroke Paternal Uncle   . Heart attack Brother 14    Social History:  Social History   Socioeconomic History  . Marital status: Legally Separated    Spouse name: Not on file  . Number of children: 0  . Years of  education: Not on file  . Highest education level: GED or equivalent  Occupational History  . Occupation: none  Tobacco Use  . Smoking status: Current Every Day Smoker    Packs/day: 1.00    Years: 22.00    Pack years: 22.00    Types: Cigarettes  . Smokeless tobacco: Never Used  Vaping Use  . Vaping Use: Former  Substance and Sexual Activity  . Alcohol use: No    Alcohol/week: 0.0 standard drinks    Comment: drinks 1 can of beer/month.   06-24-15 per pt no  . Drug use: No    Comment: 06-24-15 per pt no  . Sexual activity: Not Currently  Other Topics Concern  . Not on file  Social History Narrative  . Not on file   Social Determinants of Health   Financial Resource Strain:   . Difficulty of Paying Living Expenses: Not on file  Food Insecurity:   . Worried About Charity fundraiser in the Last Year: Not on file  . Ran Out of Food in the Last Year: Not on file  Transportation Needs:   . Lack of Transportation (Medical): Not on file  . Lack of Transportation (Non-Medical): Not on file  Physical Activity:   . Days of Exercise per Week: Not on file  . Minutes of Exercise per Session: Not on file  Stress:   . Feeling of Stress : Not on file  Social Connections:   . Frequency of Communication with Friends and Family: Not on file  . Frequency of Social Gatherings with Friends and Family: Not on file  . Attends Religious Services: Not on file  . Active Member of Clubs or Organizations: Not on file  . Attends Archivist Meetings: Not on file  . Marital Status: Not on file    Allergies:  Allergies  Allergen Reactions  . Bactrim Rash  . Bee Venom Other (See Comments)    " had a lot of bee stings at once"  Only hand swelling at site of stings    Metabolic Disorder Labs: Lab Results  Component Value Date   HGBA1C 5.6 11/12/2013   MPG 114 11/12/2013   No results found for: PROLACTIN Lab Results  Component Value Date   CHOL 163 01/03/2019   TRIG 496 (H) 01/03/2019   HDL 20 (L) 01/03/2019   CHOLHDL 8.2 (H) 01/03/2019   VLDL 33 05/06/2013   LDLCALC 66 01/03/2019   LDLCALC 148 (H) 12/02/2015   Lab Results  Component Value Date   TSH 1.302 05/07/2013    Therapeutic Level Labs: Lab Results  Component Value Date   LITHIUM <0.25 (L) 08/14/2008   LITHIUM <0.25 (L) 08/11/2008   Lab Results  Component Value Date   VALPROATE 14.5 (L) 06/26/2011   No components found for:  CBMZ  Current  Medications: Current Outpatient Medications  Medication Sig Dispense Refill  . aspirin EC 81 MG tablet Take 81 mg by mouth daily.    Marland Kitchen atorvastatin (LIPITOR) 80 MG tablet TAKE 1 TABLET DAILY 90 tablet 0  . diazepam (VALIUM) 5 MG tablet Take 1 tablet (5 mg total) by mouth every 8 (eight) hours as needed for anxiety. 90 tablet 2  . metoprolol tartrate (LOPRESSOR) 25 MG tablet Take 1 tablet (25 mg total) by mouth 2 (two) times daily. 180 tablet 0  . omeprazole (PRILOSEC) 20 MG capsule Take 1 capsule (20 mg total) by mouth daily. 30 capsule 2  . sertraline (ZOLOFT) 100 MG tablet  Take 2 tablets (200 mg total) by mouth daily. 180 tablet 1  . traZODone (DESYREL) 100 MG tablet Take 2 tablets (200 mg total) by mouth at bedtime as needed for sleep. 180 tablet 1  . umeclidinium-vilanterol (ANORO ELLIPTA) 62.5-25 MCG/INH AEPB Inhale 1 puff into the lungs daily. 60 each 2  . ziprasidone (GEODON) 20 MG capsule Take 1 capsule (20 mg total) by mouth daily with breakfast. 90 capsule 1  . ziprasidone (GEODON) 40 MG capsule Take one capsule by mouth every evening 90 capsule 1   No current facility-administered medications for this visit.     Psychiatric Specialty Exam: Review of Systems  Psychiatric/Behavioral: The patient is nervous/anxious.   All other systems reviewed and are negative.   There were no vitals taken for this visit.There is no height or weight on file to calculate BMI.  General Appearance: NA  Eye Contact:  NA  Speech:  Clear and Coherent and Normal Rate  Volume:  Normal  Mood:  Anxious and Depressed  Affect:  NA  Thought Process:  Goal Directed and Linear  Orientation:  Full (Time, Place, and Person)  Thought Content: Logical   Suicidal Thoughts:  No  Homicidal Thoughts:  No  Memory:  Immediate;   Good Recent;   Good Remote;   Good  Judgement:  Good  Insight:  Fair  Psychomotor Activity:  NA  Concentration:  Concentration: Good  Recall:  Good  Fund of Knowledge: Fair   Language: Good  Akathisia:  Negative  Handed:  Right  AIMS (if indicated): not done  Assets:  Communication Skills Desire for Improvement Financial Resources/Insurance  ADL's:  Intact  Cognition: WNL  Sleep:  Good   Screenings: GAD-7     Office Visit from 08/27/2019 in [facility] Office Visit from 12/02/2015 in [facility] Visit from 10/19/2015 in [facility]  Total GAD-7 Score 20 21 21     Mini-Mental     Clinical Support from 11/20/2014 in [facility]  Total Score (max 30 points ) 30    PHQ2-9     Office Visit from 08/27/2019 in [facility] Office Visit from 01/03/2019 in [facility] Office Visit from 06/03/2016 in [place] Office Visit from 12/02/2015 in [facility] Office Visit from 10/19/2015 in [facility]  PHQ-2 Total Score 6 2 4 6 6   PHQ-9 Total Score 20 5 16 24 23        Assessment and Plan: 49yo male with schizoaffective disorder bipolar type and GAD.No overt psychosis(denies having hallucinations, no delusions), depression under good control but still some anxiety(primarily in the middle of the day)reported. Sertraline andziprasidonedoses were increasedto 150 mg and 20mg  am/40 mg HS, respectively, and he tolerates both well.Clonazepam works better than hydroxyzine for anxiety but hewould at times forget that he took one and take another. This caused him to run out earlyin September and then again in December. He has since made arrangement with a trusted friend that he keeps his clonazepam and gives it to him when needed.He denies havingSI or mood fluctuations.Farhaan reported some recent problems with sleep which is interrupted. In the past he was on trazodone with good responseso I ordered 100 mg as needed at night. He ended up taking two of these and only  then his sleep improved.  He does not use it every night. More depressed and anxious lately.  Dx: Schizoaffective disorder bipolar type;  GAD  Plan: We willcontinueGeodon 20 mg in AM and 40 mg in HS,increase sertraline to 200mg daily,change clonazepam to diazepam 5 mg tid prn anxiety/agitation(reports that it worked well in the past) andcontinue trazodone200 mg prn insomnia. He has hypertriglyceridemia for which he takes atorvastatin 80 mg. Triglycerides have not been chacked in 11 months - I encouraged him to call his PCP and discuss this. Patient will return for next follow up visit in27months.The plan was discussed with patient who had an opportunity to ask questions and these were all answered. I spend10minutes inphone consultation with the patient.     Stephanie Acre, MD 12/11/2019, 2:44 PM

## 2019-12-12 DIAGNOSIS — G4733 Obstructive sleep apnea (adult) (pediatric): Secondary | ICD-10-CM | POA: Diagnosis not present

## 2019-12-12 DIAGNOSIS — G5631 Lesion of radial nerve, right upper limb: Secondary | ICD-10-CM | POA: Diagnosis not present

## 2020-02-05 ENCOUNTER — Telehealth: Payer: Self-pay | Admitting: Cardiovascular Disease

## 2020-02-05 NOTE — Telephone Encounter (Signed)
Called patient and offered him an appointment with Dr. Johnsie Cancel on Dec 23rd 11:15 am. Pt is agreeable. Will cancel January appointment.

## 2020-02-05 NOTE — Telephone Encounter (Signed)
Ian Grimes is calling stating he was under the impression he already had his 1 year f/u scheduled for this year prior to today, but when he called in to confirm his appointment he was advised it had not yet been scheduled. Due to this Dr. Johnsie Cancel did not have anything available before January. Ian Grimes is bothered by this due to not understanding why he never received a letter or call to schedule prior to Dr. Kyla Balzarine December schedule being filled. I advised him I can add him to the wait list and we would call him if a sooner appt became available, but he stated he has to arrange transportation so that would not work for him. He is requesting a callback from a nurse to be worked in as well as to receive an explanation in regards to this. He states if Dr. Johnsie Cancel is not able to see him in December like he promised he will not be coming back. Please advise.

## 2020-02-11 NOTE — Progress Notes (Signed)
Cardiology Office Note   Date:  02/20/2020   ID:  Ian Grimes, DOB 1971/02/05, MRN [id number]  PCP:  Loman Brooklyn, FNP  Cardiologist:   [name], MD   No chief complaint on file.     History of Present Illness: Ian Grimes is a 49 y.o. male who presents for  f/u of AV disease  History of bicuspid AV with aneurysm 2015 had valve sparing aortic root replacement with Dr Cyndia Bent  Normal coronary arteries on cath prior to surgery Previous history of PE now off anticoagulation. TTE post op with trivial AR and EF 60-65%   Still smoking significant COPD and Alpha 1 antitrypsin deficiency CXR 09/17/19 NAD  Bipolar disease fairly well controlled per [facility] visit Dr Montel Culver July 2020 mostly anxiety Clonazepam increased   Echo 03/15/18 EF 55-60% AVR normal trivial AR mean gradient 4 mmHg   He has not been vaccinated. Long discussion about the critical need for him given chronic lung issues " I don't trust it"   Past Medical History:  Diagnosis Date  . Anxiety   . Aortic aneurysm (H[facility])    repaired  . Depression   . HA (headache)   . Hx of echocardiogram    Echo (10/15):  Mild LVH, EF 60-65%, no RWMA, trivial AI, mild LAE  . Hyperlipidemia   . Pleural effusion   . Pulmonary emboli ([place])   . Pulmonary nodule    follow up CT 6 months (due 9/15)  . Schizo-affective psychosis ([place])     Past Surgical History:  Procedure Laterality Date  . ASCENDING AORTIC ROOT REPLACEMENT N/A 11/14/2013   Procedure: Value sparing Root replacement, ASCENDING AORTIC ROOT REPLACEMENT, circ Arrest;  Surgeon: Gaye Pollack, MD;  Location: [place];  Service: Open Heart Surgery;  Laterality: N/A;  . INTRAOPERATIVE TRANSESOPHAGEAL ECHOCARDIOGRAM N/A 11/14/2013   Procedure: INTRAOPERATIVE TRANSESOPHAGEAL ECHOCARDIOGRAM;  Surgeon: Gaye Pollack, MD;  Location: [place] OR;  Service: Open Heart Surgery;  Laterality: N/A;  . LEFT AND RIGHT HEART CATHETERIZATION WITH CORONARY ANGIOGRAM N/A 11/01/2013    Procedure: LEFT AND RIGHT HEART CATHETERIZATION WITH CORONARY ANGIOGRAM;  Surgeon: Josue Hector, MD;  Location: [facility] CATH LAB;  Service: Cardiovascular;  Laterality: N/A;     Current Outpatient Medications  Medication Sig Dispense Refill  . aspirin EC 81 MG tablet Take 81 mg by mouth daily.    Marland Kitchen atorvastatin (LIPITOR) 80 MG tablet TAKE 1 TABLET DAILY 90 tablet 0  . diazepam (VALIUM) 5 MG tablet Take 1 tablet (5 mg total) by mouth every 8 (eight) hours as needed for anxiety. 90 tablet 2  . metoprolol tartrate (LOPRESSOR) 25 MG tablet Take 1 tablet (25 mg total) by mouth 2 (two) times daily. 180 tablet 0  . omeprazole (PRILOSEC) 20 MG capsule Take 1 capsule (20 mg total) by mouth daily. 30 capsule 2  . sertraline (ZOLOFT) 100 MG tablet Take 2 tablets (200 mg total) by mouth daily. 180 tablet 1  . traZODone (DESYREL) 100 MG tablet Take 2 tablets (200 mg total) by mouth at bedtime as needed for sleep. 180 tablet 1  . umeclidinium-vilanterol (ANORO ELLIPTA) 62.5-25 MCG/INH AEPB Inhale 1 puff into the lungs daily. 60 each 2  . ziprasidone (GEODON) 20 MG capsule Take 1 capsule (20 mg total) by mouth daily with breakfast. 90 capsule 1  . ziprasidone (GEODON) 40 MG capsule Take one capsule by mouth every evening 90 capsule 1   No current facility-administered medications for this  visit.    Allergies:   Bactrim and Bee venom    Social History:  The patient  reports that he has been smoking cigarettes. He has a 22.00 pack-year smoking history. He has never used smokeless tobacco. He reports that he does not drink alcohol and does not use drugs.   Family History:  The patient's family history includes Alcohol abuse in his father; Asthma in his paternal aunt; COPD in his father and paternal aunt; Diabetes in his paternal grandfather; Heart attack in his father; Heart attack (age of onset: 7) in his brother; Heart disease in his father; Lung cancer in his father; Stroke in his father and paternal uncle.     ROS:  Please see the history of present illness.   Otherwise, review of systems are positive for none.   All other systems are reviewed and negative.    PHYSICAL EXAM: VS:  BP 100/64   Pulse (!) 57   Ht 6\' 1"  (1.854 m)   Wt 104.1 kg   SpO2 94%   BMI 30.27 kg/m  , BMI Body mass index is 30.27 kg/m. Anxious  Overweight white male Nicotine on breath HEENT: normal Neck supple with no adenopathy JVP normal no bruits no thyromegaly Lungs clear with no wheezing and good diaphragmatic motion Heart:  S1/S2 SEM through AVR no AR  murmur, no rub, gallop or click PMI normal Abdomen: benighn, BS positve, no tenderness, no AAA no bruit.  No HSM or HJR Distal pulses intact with no bruits No edema Neuro non-focal Skin warm and dry No muscular weakness    EKG:  NSR normal ECG 2015 02/20/2020 NSR rate 57 normal    Recent Labs: No results found for requested labs within last 8760 hours.    Lipid Panel    Component Value Date/Time   CHOL 163 01/03/2019 1141   CHOL 184 07/17/2012 1201   TRIG 496 (H) 01/03/2019 1141   TRIG 387 (H) 01/18/2013 1208   TRIG 315 (H) 07/17/2012 1201   HDL 20 (L) 01/03/2019 1141   HDL 26 (L) 01/18/2013 1208   HDL 32 (L) 07/17/2012 1201   CHOLHDL 8.2 (H) 01/03/2019 1141   CHOLHDL 5.6 05/06/2013 1223   VLDL 33 05/06/2013 1223   LDLCALC 66 01/03/2019 1141   LDLCALC 58 01/18/2013 1208   LDLCALC 89 07/17/2012 1201      Wt Readings from Last 3 Encounters:  02/20/20 104.1 kg  09/24/19 (!) 109.4 kg  09/17/19 107.8 kg      Other studies Reviewed: Additional studies/ records that were reviewed today include: Notes from 2015 cath OR report [place] office notes recent notes pulmonary regarding COPD.    ASSESSMENT AND PLAN:  1.  Bicuspid AV:  Valve sparing operation 2015 echo allright  03/15/18 SBE prophylaxis  2. COPD:  F/u Dr Chase Caller also with history of alpha 1 antitrypsin and previous PE no longer on anticoagulation. Discussed smoking  cessation CXR in July NAD  3. HLD:  Continue statin labs with primary  4. Schizophrenia:  On Zoloft, Geodon and Klonopin  f/u with psychiatry  5. ENT:  Clear to have surgery for deviated septum he has put it off for now    Current medicines are reviewed at length with the patient today.  The patient does not have concerns regarding medicines.  The following changes have been made:  no change  Labs/ tests ordered today include: CXR  No orders of the defined types were placed in this encounter.  Disposition:   FU with cardiology in a year      Signed, [name], MD   02/20/2020 11:28 AM    [address], [place],   [address] Phone: [phone]; Fax: [phone]

## 2020-02-20 ENCOUNTER — Other Ambulatory Visit: Payer: Self-pay

## 2020-02-20 ENCOUNTER — Ambulatory Visit (INDEPENDENT_AMBULATORY_CARE_PROVIDER_SITE_OTHER): Payer: Medicare Other | Admitting: Cardiovascular Disease

## 2020-02-20 ENCOUNTER — Encounter: Payer: Self-pay | Admitting: Cardiovascular Disease

## 2020-02-20 VITALS — BP 100/64 | HR 57 | Ht 73.0 in | Wt 229.4 lb

## 2020-02-20 DIAGNOSIS — Z954 Presence of other heart-valve replacement: Secondary | ICD-10-CM | POA: Diagnosis not present

## 2020-02-20 DIAGNOSIS — Z952 Presence of prosthetic heart valve: Secondary | ICD-10-CM | POA: Diagnosis not present

## 2020-02-20 DIAGNOSIS — Q231 Congenital insufficiency of aortic valve: Secondary | ICD-10-CM | POA: Diagnosis not present

## 2020-02-20 NOTE — Patient Instructions (Signed)

## 2020-02-24 ENCOUNTER — Telehealth: Payer: Self-pay | Admitting: Cardiovascular Disease

## 2020-02-24 DIAGNOSIS — Z23 Encounter for immunization: Secondary | ICD-10-CM | POA: Diagnosis not present

## 2020-02-24 NOTE — Telephone Encounter (Signed)
    Pt wanted to let Dr. Johnsie Cancel know he got his first covid vaccine and will have his 2nd shot on 03/23/20

## 2020-02-25 NOTE — Telephone Encounter (Signed)
Call him and congratulate him and say thanks

## 2020-02-25 NOTE — Telephone Encounter (Signed)
Will forward to Dr.Nishan. 

## 2020-02-26 NOTE — Telephone Encounter (Signed)
Called patient back to let him know of Dr. Fabio Bering message. Patient verbalized understanding.

## 2020-02-26 NOTE — Telephone Encounter (Signed)
Called patient and left detailed message of Dr. Fabio Bering message.

## 2020-02-26 NOTE — Telephone Encounter (Signed)
Pt returning call

## 2020-03-03 ENCOUNTER — Other Ambulatory Visit: Payer: Self-pay | Admitting: Family Medicine

## 2020-03-04 ENCOUNTER — Telehealth: Payer: Self-pay | Admitting: Internal Medicine

## 2020-03-04 NOTE — Telephone Encounter (Signed)
Called and spoke with pt and pt stated that the infusion nurse called him about 2:30 and told him that they would not be able to come tomorrow for the infusion due to his insurance has everything on hold.  Pt stated that he did not understand that and that he wanted to call and let MR know that he did not stop the infusions.  He wanted to know if he needs to continue these as well?  MR please advise. Thanks

## 2020-03-06 NOTE — Telephone Encounter (Signed)
Please refer the stiuation to [facility] Wilhemina Bonito and Frederik Schmidt in pharmacy

## 2020-03-06 NOTE — Telephone Encounter (Signed)
We had tried to fax the PA info for pt's Ian Grimes but the fax number that we had kept giving [place] a communication error and the fax would not go through. The PA paperwork was faxed 12/28, 12/30 and both times kept receiving a response that there was a communication error. Then on 03/02/20, called [place] and was provided a different fax number and this time pt's PA info went through.  Received a fax from [facility] in regards to pt's Ian Grimes stating that the status of the request was cancelled. Decision notes state that this medication or product was previously approved on [id number] from 02/29/20 to 02/27/21 and also stated that pt would be able to fill a prescription at your pharmacy.   Attempted to call pt to further discuss but unable to reach. Left message for him to return call.

## 2020-03-11 NOTE — Telephone Encounter (Signed)
Patient is returning phone call. Patient states Alpha 1 infusion has been approved. Patient phone number is [phone].

## 2020-03-11 NOTE — Telephone Encounter (Signed)
Called and spoke with pt who stated he did hear that his [place] infusions had been approved. Pt is waiting for his infusion nurse to call him back as he is also waiting to receive medication from [facility]. Pt states he hopes to be able to have next infusion soon. Nothing further needed.

## 2020-03-12 ENCOUNTER — Telehealth (INDEPENDENT_AMBULATORY_CARE_PROVIDER_SITE_OTHER): Payer: Medicare Other | Admitting: Psychiatry

## 2020-03-12 ENCOUNTER — Other Ambulatory Visit: Payer: Self-pay

## 2020-03-12 DIAGNOSIS — F250 Schizoaffective disorder, bipolar type: Secondary | ICD-10-CM | POA: Diagnosis not present

## 2020-03-12 DIAGNOSIS — F411 Generalized anxiety disorder: Secondary | ICD-10-CM | POA: Diagnosis not present

## 2020-03-12 MED ORDER — DIAZEPAM 5 MG PO TABS: 5.0000 mg | ORAL_TABLET | Freq: Three times a day (TID) | ORAL | 2 refills | Status: DC | PRN

## 2020-03-12 MED ORDER — SERTRALINE HCL 100 MG PO TABS: 200.0000 mg | ORAL_TABLET | Freq: Every day | ORAL | 1 refills | Status: AC

## 2020-03-12 MED ORDER — TRAZODONE HCL 100 MG PO TABS: 200.0000 mg | ORAL_TABLET | Freq: Every evening | ORAL | 1 refills | Status: AC | PRN

## 2020-03-12 MED ORDER — ZIPRASIDONE HCL 40 MG PO CAPS: 40.0000 mg | ORAL_CAPSULE | Freq: Two times a day (BID) | ORAL | 1 refills | Status: AC

## 2020-03-12 NOTE — Progress Notes (Signed)
[facility] MD/PA/NP OP Progress Note  03/12/2020 10:11 AM Ian Grimes  MRN:  [id number] Interview was conducted by phone and I verified that I was speaking with the correct person using two identifiers. I discussed the limitations of evaluation and management by telemedicine and  the availability of in person appointments. Patient expressed understanding and agreed to proceed. Participants in the visit: patient (location - home); physician (location - home office).  Chief Complaint: None.  HPI: 50yo male with schizoaffective disorder bipolar type and GAD.No overt psychosis(denies having hallucinations, no delusions), depression under good control but still some anxiety(primarily in the middle of the day)reported. Sertraline dose was increasedto 200 mg and clonazepam changed to diazepam prn anxiety. He reports that these changes helped. He takes diazepam typically twice daily.He denies havingSI or mood fluctuations.Tesla reportedsome recent problems with sleep which is interrupted. In the past he was on trazodone with good responseso I ordered 100 mg as needed at night. He ended up taking two of these and only then his sleep improved.He does not use it every night. No EPS or akathisia reported.  Dx: Schizoaffective disorder bipolar type; GAD   Visit Diagnosis:    ICD-10-CM   1. GAD (generalized anxiety disorder)  F41.1   2. Schizoaffective disorder, bipolar type ([place])  F25.0     Past Psychiatric History: Please see intake H&P.  Past Medical History:  Past Medical History:  Diagnosis Date  . Anxiety   . Aortic aneurysm (HCC)    repaired  . Depression   . HA (headache)   . Hx of echocardiogram    Echo (10/15):  Mild LVH, EF 60-65%, no RWMA, trivial AI, mild LAE  . Hyperlipidemia   . Pleural effusion   . Pulmonary emboli ([place])   . Pulmonary nodule    follow up CT 6 months (due 9/15)  . Schizo-affective psychosis ([place])     Past Surgical History:  Procedure Laterality  Date  . ASCENDING AORTIC ROOT REPLACEMENT N/A 11/14/2013   Procedure: Value sparing Root replacement, ASCENDING AORTIC ROOT REPLACEMENT, circ Arrest;  Surgeon: Gaye Pollack, MD;  Location: [facility];  Service: Open Heart Surgery;  Laterality: N/A;  . INTRAOPERATIVE TRANSESOPHAGEAL ECHOCARDIOGRAM N/A 11/14/2013   Procedure: INTRAOPERATIVE TRANSESOPHAGEAL ECHOCARDIOGRAM;  Surgeon: Gaye Pollack, MD;  Location: [facility];  Service: Open Heart Surgery;  Laterality: N/A;  . LEFT AND RIGHT HEART CATHETERIZATION WITH CORONARY ANGIOGRAM N/A 11/01/2013   Procedure: LEFT AND RIGHT HEART CATHETERIZATION WITH CORONARY ANGIOGRAM;  Surgeon: Josue Hector, MD;  Location: [facility] CATH LAB;  Service: Cardiovascular;  Laterality: N/A;    Family Psychiatric History: Reviewed.  Family History:  Family History  Problem Relation Age of Onset  . Heart disease Father   . Stroke Father   . Heart attack Father   . COPD Father   . Alcohol abuse Father   . Lung cancer Father   . Diabetes Paternal Grandfather   . COPD Paternal 58   . Asthma Paternal Aunt   . Stroke Paternal Uncle   . Heart attack Brother 47    Social History:  Social History   Socioeconomic History  . Marital status: Legally Separated    Spouse name: Not on file  . Number of children: 0  . Years of education: Not on file  . Highest education level: GED or equivalent  Occupational History  . Occupation: none  Tobacco Use  . Smoking status: Current Every Day Smoker    Packs/day: 1.00  Years: 22.00    Pack years: 22.00    Types: Cigarettes  . Smokeless tobacco: Never Used  Vaping Use  . Vaping Use: Former  Substance and Sexual Activity  . Alcohol use: No    Alcohol/week: 0.0 standard drinks    Comment: drinks 1 can of beer/month.  06-24-15 per pt no  . Drug use: No    Comment: 06-24-15 per pt no  . Sexual activity: Not Currently  Other Topics Concern  . Not on file  Social History Narrative  . Not on file   Social Determinants of  Health   Financial Resource Strain: Not on file  Food Insecurity: Not on file  Transportation Needs: Not on file  Physical Activity: Not on file  Stress: Not on file  Social Connections: Not on file    Allergies:  Allergies  Allergen Reactions  . Bactrim Rash  . Bee Venom Other (See Comments)    " had a lot of bee stings at once"  Only hand swelling at site of stings    Metabolic Disorder Labs: Lab Results  Component Value Date   HGBA1C 5.6 11/12/2013   MPG 114 11/12/2013   No results found for: PROLACTIN Lab Results  Component Value Date   CHOL 163 01/03/2019   TRIG 496 (H) 01/03/2019   HDL 20 (L) 01/03/2019   CHOLHDL 8.2 (H) 01/03/2019   VLDL 33 05/06/2013   LDLCALC 66 01/03/2019   LDLCALC 148 (H) 12/02/2015   Lab Results  Component Value Date   TSH 1.302 05/07/2013    Therapeutic Level Labs: Lab Results  Component Value Date   LITHIUM <0.25 (L) 08/14/2008   LITHIUM <0.25 (L) 08/11/2008   Lab Results  Component Value Date   VALPROATE 14.5 (L) 06/26/2011   No components found for:  CBMZ  Current Medications: Current Outpatient Medications  Medication Sig Dispense Refill  . aspirin EC 81 MG tablet Take 81 mg by mouth daily.    Marland Kitchen atorvastatin (LIPITOR) 80 MG tablet TAKE 1 TABLET DAILY 90 tablet 0  . diazepam (VALIUM) 5 MG tablet Take 1 tablet (5 mg total) by mouth every 8 (eight) hours as needed for anxiety. 90 tablet 2  . metoprolol tartrate (LOPRESSOR) 25 MG tablet Take 1 tablet (25 mg total) by mouth 2 (two) times daily. (Needs to be seen before next refill) 60 tablet 0  . omeprazole (PRILOSEC) 20 MG capsule Take 1 capsule (20 mg total) by mouth daily. 30 capsule 2  . [START ON 06/08/2020] sertraline (ZOLOFT) 100 MG tablet Take 2 tablets (200 mg total) by mouth daily. 180 tablet 1  . [START ON 06/08/2020] traZODone (DESYREL) 100 MG tablet Take 2 tablets (200 mg total) by mouth at bedtime as needed for sleep. 180 tablet 1  . umeclidinium-vilanterol (ANORO  ELLIPTA) 62.5-25 MCG/INH AEPB Inhale 1 puff into the lungs daily. 60 each 2  . ziprasidone (GEODON) 40 MG capsule Take 1 capsule (40 mg total) by mouth 2 (two) times daily with a meal. Take one capsule by mouth every evening 180 capsule 1   No current facility-administered medications for this visit.     Psychiatric Specialty Exam: Review of Systems  Psychiatric/Behavioral: Positive for sleep disturbance. The patient is nervous/anxious.   All other systems reviewed and are negative.   There were no vitals taken for this visit.There is no height or weight on file to calculate BMI.  General Appearance: NA  Eye Contact:  NA  Speech:  Clear and Coherent  and Normal Rate  Volume:  Normal  Mood:  Anxious  Affect:  NA  Thought Process:  Goal Directed and Linear  Orientation:  Full (Time, Place, and Person)  Thought Content: Logical   Suicidal Thoughts:  No  Homicidal Thoughts:  No  Memory:  Immediate;   Good Recent;   Good Remote;   Good  Judgement:  Good  Insight:  Fair  Psychomotor Activity:  NA  Concentration:  Concentration: Good  Recall:  Good  Fund of Knowledge: Good  Language: Good  Akathisia:  Negative  Handed:  Right  AIMS (if indicated): not done  Assets:  Communication Skills Desire for Improvement Financial Resources/Insurance Housing Social Support  ADL's:  Intact  Cognition: WNL  Sleep:  Fair   Screenings: GAD-7   [place] Office Visit from 08/27/2019 in [facility] Visit from 12/02/2015 in [facility] Visit from 10/19/2015 in [facility]  Total GAD-7 Score 20 21 21     Mini-Mental   [place] from 11/20/2014 in [facility]  Total Score (max 30 points ) 30    PHQ2-9   [place] Visit from 08/27/2019 in [facility] Visit from 01/03/2019 in [facility] Visit from  06/03/2016 in [place] Office Visit from 12/02/2015 in [facility] Office Visit from 10/19/2015 in [facility]  PHQ-2 Total Score 6 2 4 6 6   PHQ-9 Total Score 20 5 16 24 23        Assessment and Plan: 50yo male with schizoaffective disorder bipolar type and GAD.No overt psychosis(denies having hallucinations, no delusions), depression under good control but still some anxiety(primarily in the middle of the day)reported. Sertraline dose was increasedto 200 mg and clonazepam changed to diazepam prn anxiety. He reports that these changes helped. He takes diazepam typically twice daily.He denies havingSI or mood fluctuations.Draysen reportedsome recent problems with sleep which is interrupted. In the past he was on trazodone with good responseso I ordered 100 mg as needed at night. He ended up taking two of these and only then his sleep improved.He does not use it every night. No EPS or akathisia reported.  Dx: Schizoaffective disorder bipolar type; GAD  Plan: We willcontinueGeodon 40 mg bid,sertraline 200mg daily, diazepam 5 mg tid prn anxiety/agitation andtrazodone200 mg prn insomnia. He has hypertriglyceridemia for which he takes atorvastatin 80 mg. Triglycerides have not been chacked in a year - I encouraged him to call his PCP and discuss this. Patient will return for next follow up visit with a new psychiatrist in29months.The plan was discussed with patient who had an opportunity to ask questions and these were all answered. I spend32minutes inphone consultation with the patient   Stephanie Acre, MD 03/12/2020, 10:11 AM

## 2020-03-27 ENCOUNTER — Ambulatory Visit: Payer: Medicare Other | Admitting: Cardiovascular Disease

## 2020-06-05 ENCOUNTER — Telehealth (HOSPITAL_COMMUNITY): Payer: Self-pay | Admitting: *Deleted

## 2020-06-05 ENCOUNTER — Other Ambulatory Visit: Payer: Self-pay | Admitting: Family Medicine

## 2020-06-05 MED ORDER — DIAZEPAM 5 MG PO TABS: 5.0000 mg | ORAL_TABLET | Freq: Three times a day (TID) | ORAL | 2 refills | Status: AC | PRN

## 2020-06-05 NOTE — Telephone Encounter (Signed)
Done

## 2020-06-05 NOTE — Telephone Encounter (Signed)
Former pt of Dr. Montel Culver requesting refill on the Diazepam 5 mg. Pt has been assigned a new provider yet. Thanks.

## 2020-06-09 ENCOUNTER — Other Ambulatory Visit: Payer: Self-pay | Admitting: Family Medicine

## 2020-06-11 ENCOUNTER — Other Ambulatory Visit: Payer: Self-pay | Admitting: Family Medicine

## 2020-06-12 ENCOUNTER — Other Ambulatory Visit: Payer: Self-pay | Admitting: Family Medicine

## 2020-06-12 ENCOUNTER — Other Ambulatory Visit: Payer: Self-pay | Admitting: Cardiology

## 2020-06-12 ENCOUNTER — Telehealth: Payer: Self-pay | Admitting: Cardiovascular Disease

## 2020-06-12 ENCOUNTER — Telehealth: Payer: Self-pay | Admitting: Cardiology

## 2020-06-12 MED ORDER — METOPROLOL TARTRATE 25 MG PO TABS: 25.0000 mg | ORAL_TABLET | Freq: Two times a day (BID) | ORAL | 3 refills | Status: AC

## 2020-06-12 NOTE — Telephone Encounter (Signed)
Pt called needing refill on metoprolol.  He saw Dr. Johnsie Cancel in December so will refill.  Sent to [facility] in [place] 3 month supply

## 2020-06-12 NOTE — Telephone Encounter (Signed)
*  STAT* If patient is at the pharmacy, call can be transferred to refill team.   1. Which medications need to be refilled? (please list name of each medication and dose if known)  metoprolol tartrate (LOPRESSOR) 25 MG tablet  2. Which pharmacy/location (including street and city if local pharmacy) is medication to be sent to? [place], [place]  3. Do they need a 30 day or 90 day supply? [place]

## 2020-06-12 NOTE — Telephone Encounter (Signed)
Patient wanted to speak to a Nurse and find out why his PCP denied the refill for this medication. Please call the patient to discuss

## 2020-06-12 NOTE — Telephone Encounter (Signed)
Called pt to inform him that his medication was denied by his PCP because he is overdue for an appt and that he needed to call his PCP to schedule an appt with them to get a refill on his medication. I informed the pt that if he has any cardiac problems, questions or concerns, to give our office a call back. Pt verbalized understanding.

## 2020-06-18 ENCOUNTER — Other Ambulatory Visit: Payer: Self-pay

## 2020-06-18 ENCOUNTER — Telehealth (HOSPITAL_COMMUNITY): Payer: Self-pay | Admitting: *Deleted

## 2020-06-18 ENCOUNTER — Telehealth (HOSPITAL_COMMUNITY): Payer: Medicare Other | Admitting: Psychiatry

## 2020-06-18 ENCOUNTER — Telehealth: Payer: Self-pay

## 2020-06-18 ENCOUNTER — Telehealth: Payer: Self-pay | Admitting: Cardiovascular Disease

## 2020-06-18 ENCOUNTER — Encounter (HOSPITAL_COMMUNITY): Payer: Self-pay | Admitting: Psychiatry

## 2020-06-18 ENCOUNTER — Telehealth: Payer: Self-pay | Admitting: Internal Medicine

## 2020-06-18 DIAGNOSIS — F250 Schizoaffective disorder, bipolar type: Secondary | ICD-10-CM

## 2020-06-18 NOTE — Telephone Encounter (Signed)
Will forward this message to Dr. Johnsie Cancel as a general FYI.  Pt has called several different Providers offices today, stating he is leaving their practice.  He did see Psychiatrist today via video visit.

## 2020-06-18 NOTE — Telephone Encounter (Signed)
Pt called angrily stating that he "is done" and has thrown all his medications away. Pt is asking for another provider. Pt eludes to suicidal type statements but when directly asked by this writer pt denies SI and says that "several people today have been trying to trick me into saying it". Writer assured that she was concerned about pt wellbeing. Safety plan reinforced. Med ed reinforced also. Marland Kitchen

## 2020-06-18 NOTE — Telephone Encounter (Signed)
Pt is calling in stating he is leaving the practice and not returning. He also states that he does not want any more phone calls due to him not returning.Please advise

## 2020-06-18 NOTE — Telephone Encounter (Signed)
Patient called in to state that he would not be returning to our practice.

## 2020-06-18 NOTE — Telephone Encounter (Signed)
noted 

## 2020-06-18 NOTE — Telephone Encounter (Signed)
FYI

## 2020-06-18 NOTE — Progress Notes (Signed)
[facility] MD/PA/NP OP Progress Note  06/18/2020 1:08 PM Ian Grimes  MRN:  [id number] Interview was conducted by phone and I verified that I was speaking with the correct person using two identifiers. I discussed the limitations of evaluation and management by telemedicine and  the availability of in person appointments. Patient expressed understanding and agreed to proceed. Participants in the visit: patient (location - home); physician (location - home office).  Chief Complaint:  None.  HPI: 50 yo male with schizoaffective disorder bipolar type and GAD. Previously seen by Dr.Pucilowska. today patient reports he is not doing well, does not elaborate, states the valium is not working for him. He was recently refilled on his valium (diazepam 5mg  po tid) by [facility] [place] for 3 months. Now patient requesting to be switched to Lorazepam . He is not interested in talking about his other medications and how they are working for him. He cut the call as this clinician was attempting to assess patient.  Per dr.Pucilowska, "No overt psychosis (denies having hallucinations, no delusions), depression under good control but still some anxiety (primarily in the middle of the day) reported. Sertraline dose was increased to 200 mg and clonazepam changed to diazepam prn anxiety. He reports that these changes helped. He takes diazepam typically twice daily.  He denies having SI or mood fluctuations. Ian Grimes reported some recent problems with sleep which is interrupted. In the past he was on trazodone with good response so I ordered 100 mg as needed at night. He ended up taking two of these and only then his sleep improved.  He does not use it every night. No EPS or akathisia reported.   Dx: Schizoaffective disorder bipolar type; GAD    Visit Diagnosis:  No diagnosis found.  Past Psychiatric History: Please see intake H&P.  Past Medical History:  Past Medical History:  Diagnosis Date   Anxiety     Aortic aneurysm ([place])    repaired   Depression    HA (headache)    Hx of echocardiogram    Echo (10/15):  Mild LVH, EF 60-65%, no RWMA, trivial AI, mild LAE   Hyperlipidemia    Pleural effusion    Pulmonary emboli (HCC)    Pulmonary nodule    follow up CT 6 months (due 9/15)   Schizo-affective psychosis ([place])     Past Surgical History:  Procedure Laterality Date   ASCENDING AORTIC ROOT REPLACEMENT N/A 11/14/2013   Procedure: Value sparing Root replacement, ASCENDING AORTIC ROOT REPLACEMENT, circ Arrest;  Surgeon: Gaye Pollack, MD;  Location: [facility];  Service: Open Heart Surgery;  Laterality: N/A;   INTRAOPERATIVE TRANSESOPHAGEAL ECHOCARDIOGRAM N/A 11/14/2013   Procedure: INTRAOPERATIVE TRANSESOPHAGEAL ECHOCARDIOGRAM;  Surgeon: Gaye Pollack, MD;  Location: [facility];  Service: Open Heart Surgery;  Laterality: N/A;   LEFT AND RIGHT HEART CATHETERIZATION WITH CORONARY ANGIOGRAM N/A 11/01/2013   Procedure: LEFT AND RIGHT HEART CATHETERIZATION WITH CORONARY ANGIOGRAM;  Surgeon: Josue Hector, MD;  Location: [facility] C[facility] LAB;  Service: Cardiovascular;  Laterality: N/A;    Family Psychiatric History: Reviewed.  Family History:  Family History  Problem Relation Age of Onset   Heart disease Father    Stroke Father    Heart attack Father    COPD Father    Alcohol abuse Father    Lung cancer Father    Diabetes Paternal Grandfather    COPD Paternal Aunt    Asthma Paternal Aunt    Stroke Paternal Uncle    Heart  attack Brother 51    Social History:  Social History   Socioeconomic History   Marital status: Legally Separated    Spouse name: Not on file   Number of children: 0   Years of education: Not on file   Highest education level: GED or equivalent  Occupational History   Occupation: none  Tobacco Use   Smoking status: Current Every Day Smoker    Packs/day: 1.00    Years: 22.00    Pack years: 22.00    Types: Cigarettes   Smokeless tobacco: Never Used  Scientific laboratory technician Use:  Former  Substance and Sexual Activity   Alcohol use: No    Alcohol/week: 0.0 standard drinks    Comment: drinks 1 can of beer/month.  06-24-15 per pt no   Drug use: No    Comment: 06-24-15 per pt no   Sexual activity: Not Currently  Other Topics Concern   Not on file  Social History Narrative   Not on file   Social Determinants of Health   Financial Resource Strain: Not on file  Food Insecurity: Not on file  Transportation Needs: Not on file  Physical Activity: Not on file  Stress: Not on file  Social Connections: Not on file    Allergies:  Allergies  Allergen Reactions   Bactrim Rash   Bee Venom Other (See Comments)    " had a lot of bee stings at once"  Only hand swelling at site of stings    Metabolic Disorder Labs: Lab Results  Component Value Date   HGBA1C 5.6 11/12/2013   MPG 114 11/12/2013   No results found for: PROLACTIN Lab Results  Component Value Date   CHOL 163 01/03/2019   TRIG 496 (H) 01/03/2019   HDL 20 (L) 01/03/2019   CHOLHDL 8.2 (H) 01/03/2019   VLDL 33 05/06/2013   LDLCALC 66 01/03/2019   LDLCALC 148 (H) 12/02/2015   Lab Results  Component Value Date   TSH 1.302 05/07/2013    Therapeutic Level Labs: Lab Results  Component Value Date   LITHIUM <0.25 (L) 08/14/2008   LITHIUM <0.25 (L) 08/11/2008   Lab Results  Component Value Date   VALPROATE 14.5 (L) 06/26/2011   No components found for:  CBMZ  Current Medications: Current Outpatient Medications  Medication Sig Dispense Refill   aspirin EC 81 MG tablet Take 81 mg by mouth daily.     atorvastatin (LIPITOR) 80 MG tablet Take 1 tablet (80 mg total) by mouth daily. (NEEDS TO BE SEEN BEFORE NEXT REFILL) 30 tablet 0   diazepam (VALIUM) 5 MG tablet Take 1 tablet (5 mg total) by mouth every 8 (eight) hours as needed for anxiety. 90 tablet 2   metoprolol tartrate (LOPRESSOR) 25 MG tablet Take 1 tablet (25 mg total) by mouth 2 (two) times daily. (Needs to be seen before next refill) 180  tablet 3   omeprazole (PRILOSEC) 20 MG capsule Take 1 capsule (20 mg total) by mouth daily. 30 capsule 2   sertraline (ZOLOFT) 100 MG tablet Take 2 tablets (200 mg total) by mouth daily. 180 tablet 1   traZODone (DESYREL) 100 MG tablet Take 2 tablets (200 mg total) by mouth at bedtime as needed for sleep. 180 tablet 1   umeclidinium-vilanterol (ANORO ELLIPTA) 62.5-25 MCG/INH AEPB Inhale 1 puff into the lungs daily. 60 each 2   ziprasidone (GEODON) 40 MG capsule Take 1 capsule (40 mg total) by mouth 2 (two) times daily with a meal.  Take one capsule by mouth every evening 180 capsule 1   No current facility-administered medications for this visit.     Psychiatric Specialty Exam: Review of Systems  Psychiatric/Behavioral: Positive for sleep disturbance. The patient is nervous/anxious.   All other systems reviewed and are negative.   There were no vitals taken for this visit.There is no height or weight on file to calculate BMI.  General Appearance: NA  Eye Contact:  NA  Speech:  Clear and Coherent and Normal Rate  Volume:  Normal  Mood:  Anxious  Affect:  NA  Thought Process:  Goal Directed and Linear  Orientation:  Full (Time, Place, and Person)  Thought Content: Logical   Suicidal Thoughts:  No  Homicidal Thoughts:  No  Memory:  Immediate;   Good Recent;   Good Remote;   Good  Judgement:  Good  Insight:  Fair  Psychomotor Activity:  NA  Concentration:  Concentration: Good  Recall:  Good  Fund of Knowledge: Good  Language: Good  Akathisia:  Negative  Handed:  Right  AIMS (if indicated): not done  Assets:  Communication Skills Desire for Improvement Financial Resources/Insurance Housing Social Support  ADL's:  Intact  Cognition: WNL  Sleep:  Fair   Screenings: GAD-7    [place] Office Visit from 08/27/2019 in [place] Visit from 12/02/2015 in [place] [facility] Office Visit from 10/19/2015 in [place]  Total GAD-7 Score 20 21 21       Mini-Mental    [place] from 11/20/2014 in [facility]  Total Score (max 30 points ) 30      PHQ2-9    [place] Visit from 08/27/2019 in [facility] Visit from 01/03/2019 in [facility] Office Visit from 06/03/2016 in [place] Office Visit from 12/02/2015 in [facility] Office Visit from 10/19/2015 in [facility]  PHQ-2 Total Score 6 2 4 6 6   PHQ-9 Total Score 20 5 16 24 23         Assessment and Plan: 50 yo male with schizoaffective disorder bipolar type and GAD. No overt psychosis (denies having hallucinations, no delusions), depression under good control but still some anxiety (primarily in the middle of the day) reported. Sertraline dose was increased to 200 mg and clonazepam changed to diazepam prn anxiety. He reports that these changes helped. He takes diazepam typically twice daily.  He denies having SI or mood fluctuations. [place] reported some recent problems with sleep which is interrupted. In the past he was on trazodone with good response so I ordered 100 mg as needed at night. He ended up taking two of these and only then his sleep improved.  He does not use it every night. No EPS or akathisia reported.   Dx: Schizoaffective disorder bipolar type; GAD   Plan: Continue Geodon 40 mg bid, sertraline 200 mg daily, diazepam 5 mg tid prn anxiety/agitation(most recently refilled) and trazodone 200 mg prn insomnia. He has hypertriglyceridemia for which he takes atorvastatin 80 mg.  Patient wanting to switch from diazepam to ativan, not interested in discussing how his other medications are working for him. He ended the call as this clinician was attempting to speak furthur. Follow up in 1 month to assess medication changes based on clinical assessment. . The plan  was discussed with patient who had an opportunity to ask questions and these were all  answered. I spend 15 minutes in phone consultation with the patient. I reviewed the chart for 20 minutes.   Elvin So, MD 06/18/2020, 1:08 PM

## 2020-06-25 ENCOUNTER — Ambulatory Visit: Payer: Medicare Other | Admitting: Family Medicine

## 2020-06-26 ENCOUNTER — Telehealth: Payer: Self-pay | Admitting: Primary Care

## 2020-06-26 NOTE — Telephone Encounter (Signed)
Estill Bamberg called back to update: states she was unsuccessful in contacting the pt's emergency contact. She reached out to the sheriff's department to perform a wellness check. Per Estill Bamberg, the sheriff entered the pt's home and found the pt to be deceased. Estill Bamberg states that the sheriff's department will undergo an investigation to determine his COD.  I have spoken with Dr. Chase Caller to make aware of situation.  Nothing further needed at this time- will close encounter.

## 2020-06-26 NOTE — Telephone Encounter (Signed)
Spoke with home health nurse Estill Bamberg, states that she has been unable to get patient to answer the door in the past week for his [place] infusion.  Upon further review of the pt's chart, I see he had an appt on 4/21 with his behavioral health office, and on the same day called our office, cardiology, and PCP and made [place] all aware that he will not be returning to clinic and asked [place] not to contact him anymore. I relayed this info to [place]. Estill Bamberg also verified the emergency contact we have on file for the pt. Estill Bamberg states that she will try to contact the emergency contact to check the status of this pt, and if this is unsuccessful she will call the sheriff's office for a wellness check on the pt. Estill Bamberg will call back to keep our office updated.   Will hold encounter open until [place] calls back.

## 2020-06-28 DIAGNOSIS — 419620001 Death: Secondary | SNOMED CT | POA: Diagnosis not present

## 2020-06-28 DEATH — deceased

## 2020-06-29 ENCOUNTER — Telehealth (HOSPITAL_COMMUNITY): Payer: Self-pay | Admitting: *Deleted

## 2020-06-29 NOTE — Telephone Encounter (Signed)
Opened in error

## 2020-07-07 ENCOUNTER — Telehealth: Payer: Self-pay | Admitting: *Deleted

## 2020-11-13 ENCOUNTER — Telehealth: Payer: Self-pay | Admitting: Primary Care

## 2020-11-13 NOTE — Telephone Encounter (Signed)
Pt is deceased  Returned call to [facility] and spoke with rep who stated that she could not see that anyone called [place] regarding pt  I am closing encounter

## 2021-01-27 ENCOUNTER — Telehealth: Payer: Self-pay | Admitting: Primary Care

## 2021-01-27 NOTE — Telephone Encounter (Signed)
LMTCB with Santiago Glad.   Phone note from 05/30/2020 states that patient is deceased.

## 2021-01-28 NOTE — Telephone Encounter (Signed)
Called [facility] and spoke with Santiago Glad letting her know of pt's passing in April 2022. Per Santiago Glad, she will close out pt's [place] with [facility]. Nothing further needed.
